# Patient Record
Sex: Female | Born: 1970 | Race: White | Hispanic: No | Marital: Married | Smoking: Never smoker
Health system: Southern US, Community
[De-identification: ages and names within clinical notes are randomized; demographics above are authoritative.]

## PROBLEM LIST (undated history)

## (undated) DIAGNOSIS — Z8249 Family history of ischemic heart disease and other diseases of the circulatory system: Secondary | ICD-10-CM

## (undated) DIAGNOSIS — G43829 Menstrual migraine, not intractable, without status migrainosus: Secondary | ICD-10-CM

## (undated) DIAGNOSIS — K219 Gastro-esophageal reflux disease without esophagitis: Secondary | ICD-10-CM

## (undated) DIAGNOSIS — R638 Other symptoms and signs concerning food and fluid intake: Secondary | ICD-10-CM

## (undated) DIAGNOSIS — I10 Essential (primary) hypertension: Secondary | ICD-10-CM

## (undated) DIAGNOSIS — R0609 Other forms of dyspnea: Secondary | ICD-10-CM

## (undated) DIAGNOSIS — T7840XA Allergy, unspecified, initial encounter: Secondary | ICD-10-CM

## (undated) DIAGNOSIS — M1990 Unspecified osteoarthritis, unspecified site: Secondary | ICD-10-CM

## (undated) DIAGNOSIS — A6000 Herpesviral infection of urogenital system, unspecified: Secondary | ICD-10-CM

## (undated) DIAGNOSIS — O021 Missed abortion: Secondary | ICD-10-CM

## (undated) DIAGNOSIS — N879 Dysplasia of cervix uteri, unspecified: Secondary | ICD-10-CM

## (undated) DIAGNOSIS — F419 Anxiety disorder, unspecified: Secondary | ICD-10-CM

## (undated) HISTORY — DX: Family history of ischemic heart disease and other diseases of the circulatory system: Z82.49

## (undated) HISTORY — DX: Unspecified osteoarthritis, unspecified site: M19.90

## (undated) HISTORY — DX: Other forms of dyspnea: R06.09

## (undated) HISTORY — PX: MOUTH SURGERY: SHX715

## (undated) HISTORY — DX: Gastro-esophageal reflux disease without esophagitis: K21.9

## (undated) HISTORY — DX: Essential (primary) hypertension: I10

## (undated) HISTORY — DX: Anxiety disorder, unspecified: F41.9

## (undated) HISTORY — DX: Allergy, unspecified, initial encounter: T78.40XA

## (undated) HISTORY — DX: Other symptoms and signs concerning food and fluid intake: R63.8

## (undated) HISTORY — PX: DILATION AND CURETTAGE OF UTERUS: SHX78

## (undated) HISTORY — DX: Menstrual migraine, not intractable, without status migrainosus: G43.829

## (undated) HISTORY — DX: Herpesviral infection of urogenital system, unspecified: A60.00

## (undated) HISTORY — DX: Dysplasia of cervix uteri, unspecified: N87.9

---

## 2006-07-06 ENCOUNTER — Ambulatory Visit: Payer: Self-pay | Admitting: Obstetrics and Gynecology

## 2009-11-20 HISTORY — PX: TONSILECTOMY/ADENOIDECTOMY WITH MYRINGOTOMY: SHX6125

## 2010-02-24 ENCOUNTER — Ambulatory Visit: Payer: Self-pay | Admitting: Otolaryngology

## 2011-01-31 ENCOUNTER — Ambulatory Visit: Payer: Self-pay | Admitting: Internal Medicine

## 2011-12-12 ENCOUNTER — Ambulatory Visit: Payer: Self-pay | Admitting: Obstetrics and Gynecology

## 2011-12-12 LAB — CBC
HCT: 41.1 % (ref 35.0–47.0)
HGB: 13.7 g/dL (ref 12.0–16.0)
MCH: 30.6 pg (ref 26.0–34.0)
MCHC: 33.4 g/dL (ref 32.0–36.0)
RBC: 4.49 10*6/uL (ref 3.80–5.20)
WBC: 8.7 10*3/uL (ref 3.6–11.0)

## 2011-12-14 LAB — PATHOLOGY REPORT

## 2012-06-16 ENCOUNTER — Emergency Department: Payer: Self-pay | Admitting: Unknown Physician Specialty

## 2012-06-16 LAB — CBC
HCT: 40.6 % (ref 35.0–47.0)
MCH: 31.5 pg (ref 26.0–34.0)
MCHC: 34.8 g/dL (ref 32.0–36.0)
MCV: 90 fL (ref 80–100)
Platelet: 218 10*3/uL (ref 150–440)
RBC: 4.49 10*6/uL (ref 3.80–5.20)
RDW: 12.7 % (ref 11.5–14.5)

## 2012-06-16 LAB — WET PREP, GENITAL

## 2012-06-16 LAB — URINALYSIS, COMPLETE
Glucose,UR: NEGATIVE mg/dL (ref 0–75)
Leukocyte Esterase: NEGATIVE
Nitrite: NEGATIVE
Ph: 7 (ref 4.5–8.0)
RBC,UR: NONE SEEN /HPF (ref 0–5)
Specific Gravity: 1.002 (ref 1.003–1.030)
Squamous Epithelial: 1
WBC UR: <1 /HPF (ref 0–5)

## 2012-06-16 LAB — HCG, QUANTITATIVE, PREGNANCY: Beta Hcg, Quant.: 7298 m[IU]/mL — ABNORMAL HIGH

## 2012-06-17 ENCOUNTER — Ambulatory Visit: Payer: Self-pay | Admitting: Obstetrics and Gynecology

## 2013-07-10 LAB — HM PAP SMEAR: HM Pap smear: NEGATIVE

## 2013-08-05 ENCOUNTER — Ambulatory Visit: Payer: Self-pay | Admitting: Obstetrics and Gynecology

## 2014-02-19 ENCOUNTER — Encounter: Payer: Self-pay | Admitting: Internal Medicine

## 2014-02-19 ENCOUNTER — Ambulatory Visit (INDEPENDENT_AMBULATORY_CARE_PROVIDER_SITE_OTHER): Payer: BC Managed Care – PPO | Admitting: Internal Medicine

## 2014-02-19 ENCOUNTER — Encounter (INDEPENDENT_AMBULATORY_CARE_PROVIDER_SITE_OTHER): Payer: Self-pay

## 2014-02-19 VITALS — BP 140/100 | HR 86 | Temp 98.6°F | Resp 16 | Ht 67.0 in | Wt 256.5 lb

## 2014-02-19 DIAGNOSIS — R5383 Other fatigue: Secondary | ICD-10-CM

## 2014-02-19 DIAGNOSIS — M129 Arthropathy, unspecified: Secondary | ICD-10-CM

## 2014-02-19 DIAGNOSIS — R635 Abnormal weight gain: Secondary | ICD-10-CM

## 2014-02-19 DIAGNOSIS — E669 Obesity, unspecified: Secondary | ICD-10-CM

## 2014-02-19 DIAGNOSIS — M130 Polyarthritis, unspecified: Secondary | ICD-10-CM

## 2014-02-19 DIAGNOSIS — M1990 Unspecified osteoarthritis, unspecified site: Secondary | ICD-10-CM

## 2014-02-19 DIAGNOSIS — R5381 Other malaise: Secondary | ICD-10-CM

## 2014-02-19 LAB — CBC WITH DIFFERENTIAL/PLATELET
BASOS ABS: 0 10*3/uL (ref 0.0–0.1)
Basophils Relative: 0.6 % (ref 0.0–3.0)
Eosinophils Absolute: 0 10*3/uL (ref 0.0–0.7)
Eosinophils Relative: 0.6 % (ref 0.0–5.0)
HEMATOCRIT: 42.8 % (ref 36.0–46.0)
Hemoglobin: 14.3 g/dL (ref 12.0–15.0)
Lymphocytes Relative: 38.7 % (ref 12.0–46.0)
Lymphs Abs: 2.1 10*3/uL (ref 0.7–4.0)
MCHC: 33.3 g/dL (ref 30.0–36.0)
MCV: 91.4 fl (ref 78.0–100.0)
MONOS PCT: 6.3 % (ref 3.0–12.0)
Monocytes Absolute: 0.3 10*3/uL (ref 0.1–1.0)
Neutro Abs: 3 10*3/uL (ref 1.4–7.7)
Neutrophils Relative %: 53.8 % (ref 43.0–77.0)
PLATELETS: 225 10*3/uL (ref 150.0–400.0)
RBC: 4.69 Mil/uL (ref 3.87–5.11)
RDW: 12.8 % (ref 11.5–14.6)
WBC: 5.5 10*3/uL (ref 4.5–10.5)

## 2014-02-19 LAB — TSH: TSH: 0.3 u[IU]/mL — AB (ref 0.35–5.50)

## 2014-02-19 LAB — COMPREHENSIVE METABOLIC PANEL
ALBUMIN: 3.7 g/dL (ref 3.5–5.2)
ALT: 29 U/L (ref 0–35)
AST: 22 U/L (ref 0–37)
Alkaline Phosphatase: 48 U/L (ref 39–117)
BUN: 11 mg/dL (ref 6–23)
CALCIUM: 8.6 mg/dL (ref 8.4–10.5)
CHLORIDE: 103 meq/L (ref 96–112)
CO2: 30 meq/L (ref 19–32)
Creatinine, Ser: 1 mg/dL (ref 0.4–1.2)
GFR: 62.12 mL/min (ref >60.00)
Glucose, Bld: 90 mg/dL (ref 70–99)
Potassium: 4.1 mEq/L (ref 3.5–5.1)
Sodium: 137 mEq/L (ref 135–145)
Total Bilirubin: 0.7 mg/dL (ref 0.3–1.2)
Total Protein: 6.8 g/dL (ref 6.0–8.3)

## 2014-02-19 LAB — SEDIMENTATION RATE: Sed Rate: 13 mm/hr (ref 0–22)

## 2014-02-19 LAB — C-REACTIVE PROTEIN: CRP: 0.6 mg/dL (ref 0.5–20.0)

## 2014-02-19 MED ORDER — HYDROCHLOROTHIAZIDE 25 MG PO TABS: 25.0000 mg | ORAL_TABLET | Freq: Every day | ORAL | Status: DC

## 2014-02-19 NOTE — Progress Notes (Signed)
Patient ID: Laura [name], female   DOB: 06-04-1971, 43 y.o.   MRN: [id number]  Patient Active Problem List   Diagnosis Date Noted  . Polyarthritis of multiple sites 02/21/2014  . Obesity, unspecified 02/21/2014    Subjective:  CC:   Chief Complaint  Patient presents with  . Establish Care    HPI:   Laura [name] a 43 y.o. female who presents  With joint pain    Cc:  Polyarthritis.,  keepin her awake at night.  Right knee righ hip mostly  Relieved with 4 advil at bedtime but doesn't always help, gets stiff after an hour ride to work. . Knees hurt most with downward descent ,  No giving way.  No history of trauma ,  No MVAs since high school.  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  Does not exercise regularly but has started walking trails. occl ETOH 1 glass occasionally .  No tobacco husband is a Higher education careers adviser.  Obesity:  She ha had a  Wt gain of 20 lb s since October. Diet reviewed, no sweet tea or soft drinks,  Drinks Herbal Life shake for breakfast.at 5:30 am.  9:00 almonds 12:00   grilled chicken wrap   With a fruit cup.  Mid day snack,  almonds a handful,  String cheese.  Dinner  Wore a size 12 to 14 in high school.     Past Medical History  Diagnosis Date  . Allergy   . Hypertension     HX of high readings     No Known Allergies  Past Surgical History  Procedure Laterality Date  . Tonsilectomy/adenoidectomy with myringotomy Bilateral 2011  . Dilation and curettage of uterus  2014 FEB and August    History   Social History  . Marital Status: Married    Spouse Name: N/A    Number of Children: N/A  . Years of Education: N/A   Occupational History  . Not on file.   Social History Main Topics  . Smoking status: Never Smoker   . Smokeless tobacco: Never Used  . Alcohol Use: Yes     Comment:  occasioally  . Drug Use: No  . Sexual Activity: Yes   Other Topics Concern  . Not on file   Social History Narrative  . No narrative on file    Family History  Problem Relation Age of Onset  . Arthritis Mother   . Hypertension Mother   . Diabetes Mother   . Heart disease Father   . Hyperlipidemia Father   . Hypertension Father    Review of Systems:   The rest of the review of systems was negative except those addressed in the HPI.   Objective:  BP 140/100  Pulse 86  Temp(Src) 98.6 F (37 C) (Oral)  Resp 16  Ht 5' 7"  (1.702 m)  Wt 256 lb 8 oz (116.348 kg)  BMI 40.16 kg/m2  SpO2 98%  General appearance: alert, cooperative and appears stated age Ears: normal TM's and external ear canals both ears Throat: lips, mucosa, and tongue normal; teeth and gums normal Neck: no adenopathy, no carotid bruit, supple, symmetrical, trachea midline and thyroid not enlarged, symmetric, no tenderness/mass/nodules Back: symmetric, no curvature. ROM normal. No CVA tenderness. Lungs: clear  to auscultation bilaterally Heart: regular rate and rhythm, S1, S2 normal, no murmur, click, rub or gallop Abdomen: soft, non-tender; bowel sounds normal; no masses,  no organomegaly Pulses: 2+ and symmetric Skin: Skin color, texture, turgor normal. No rashes or lesions Lymph nodes: Cervical, supraclavicular, and axillary nodes normal.  Assessment and Plan:  Polyarthritis of multiple sites She has no synovitis on exam and the joints affected are mostly weight bearing. Will screen for inflammatory arthritis with ESR and CRP  Obesity, unspecified I have addressed  BMI and recommended a low glycemic index diet utilizing smaller more frequent meals to increase metabolism.  I have also recommended that patient start exercising with a goal of 30 minutes of aerobic exercise a minimum of 5 days per week. Screening for lipid disorders, thyroid and diabetes to be done today.   A total of 44 minutes was  spent with patient more than half of which was spent in counseling.  Updated Medication List Outpatient Encounter Prescriptions as of 02/19/2014  Medication Sig  . hydrochlorothiazide (HYDRODIURIL) 25 MG tablet Take 1 tablet (25 mg total) by mouth daily.     Orders Placed This Encounter  Procedures  . TSH  . Comprehensive metabolic panel  . CBC with Differential  . Sedimentation rate  . C-reactive protein    No Follow-up on file.

## 2014-02-19 NOTE — Progress Notes (Signed)
Pre-visit discussion using our clinic review tool. No additional management support is needed unless otherwise documented below in the visit note.  

## 2014-02-19 NOTE — Patient Instructions (Addendum)
We are starting hctz 25 mg daily for blood pressure Our Goal is to get your bp down to  130 /80 before we consider adding  Phentermine for wt loss   Returnin one month for BP check and fasting labs   Goal with exercise is 30 minutes of aerobic exercise 5 times weekly  This is  One version of a  "Low GI"  Diet:  It will still lower your blood sugars and allow you to lose 4 to 8  lbs  per month if you follow it carefully.  Your goal with exercise is a minimum of 30 minutes of aerobic exercise 5 days per week (Walking does not count once it becomes easy!)    All of the foods can be found at grocery stores and in bulk at [employer].  The Atkins protein bars and shakes are available in more varieties at [employer], [employer] and [place].     7 AM Breakfast:  Choose from the following:  Low carbohydrate Protein  Shakes (I recommend the EAS AdvantEdge "Carb Control" shakes  Or the low carb shakes by Atkins.    2.5 carbs   Arnold's "Sandwhich Thin"toasted  w/ peanut butter (no jelly: about 20 net carbs  "Bagel Thin" with cream cheese and salmon: about 20 carbs   a scrambled egg/bacon/cheese burrito made with Mission's "carb balance" whole wheat tortilla  (about 10 net carbs )   Avoid cereal and bananas, oatmeal and cream of wheat and grits. They are loaded with carbohydrates!   10 AM: high protein snack  Protein bar by Atkins (the snack size, under 200 cal, usually < 6 net carbs).    A stick of cheese:  Around 1 carb,  100 cal     Dannon Light n Fit [place] Yogurt  (80 cal, 8 carbs)  Other so called "protein bars" and Greek yogurts tend to be loaded with carbohydrates.  Remember, in food advertising, the word "energy" is synonymous for " carbohydrate."  Lunch:   A Sandwich using the bread choices listed, Can use any  Eggs,  lunchmeat, grilled meat or canned tuna), avocado, regular mayo/mustard  and cheese.  A Salad using blue cheese, ranch,  Goddess or vinagrette,  No croutons or "confetti" and no  "candied nuts" but regular nuts OK.   No pretzels or chips.  Pickles and miniature sweet peppers are a good low carb alternative that provide a "crunch"  The bread is the only source of carbohydrate in a sandwich and  can be decreased by trying some of these alternatives to traditional loaf bread  Joseph's makes a pita bread and a flat bread that are 50 cal and 4 net carbs available at [place] and [place].  This can be toasted to use with hummous as well  Toufayan makes a low carb flatbread that's 100 cal and 9 net carbs available at [employer] and [employer]J's makes 2 sizes of  Low carb whole wheat tortilla  (The large one is 210 cal and 6 net carbs) Avoid "Low fat dressings, as well as Barry Brunner and [place] dressings They are loaded with sugar!   3 PM/ Mid day  Snack:  Consider  1 ounce of  almonds, walnuts, pistachios, pecans, peanuts,  Macadamia nuts or a nut medley.  Avoid "granola"; the dried cranberries and raisins are loaded with carbohydrates. Mixed nuts as long as there are no raisins,  cranberries or dried fruit.     6 PM  Dinner:  Meat/fowl/fish with a green salad, and either broccoli, cauliflower, green beans, spinach, brussel sprouts or  Lima beans. DO NOT BREAD THE PROTEIN!!      There is a low carb pasta by Dreamfield's that is acceptable and tastes great: only 5 digestible carbs/serving.( All grocery stores but BJs carry it )  Try Hurley Cisco Angelo's chicken piccata or chicken or eggplant parm over low carb pasta.(Lowes and BJs)   Marjory Lies Sanchez's "Carnitas" (pulled pork, no sauce,  0 carbs) or his beef pot roast to make a dinner burrito (at [employer]J's)  Pesto over low carb pasta (bj's sells a good quality pesto in the center refrigerated section of the deli   Whole wheat pasta is still full of digestible carbs and  Not as low in glycemic index as Dreamfield's.   Brown rice is still rice,  So skip the rice and noodles if you eat [place] or [place] (or at least limit to 1/2 cup)  9  PM snack :   Breyer's "low carb" fudgsicle or  ice cream bar (Carb Smart line), or  Weight Watcher's ice cream bar , or another "no sugar added" ice cream;  a serving of fresh berries/cherries with whipped cream   Cheese or DANNON'S LlGHT N FIT [place] YOGURT  Avoid bananas, pineapple, grapes  and watermelon on a regular basis because they are high in sugar.  THINK OF THEM AS DESSERT  Remember that snack Substitutions should be less than 10 NET carbs per serving and meals < 20 carbs. Remember to subtract fiber grams to get the "net carbs."

## 2014-02-21 ENCOUNTER — Encounter: Payer: Self-pay | Admitting: Internal Medicine

## 2014-02-21 DIAGNOSIS — E669 Obesity, unspecified: Secondary | ICD-10-CM | POA: Insufficient documentation

## 2014-02-21 DIAGNOSIS — M130 Polyarthritis, unspecified: Secondary | ICD-10-CM | POA: Insufficient documentation

## 2014-02-21 NOTE — Assessment & Plan Note (Signed)
She has no synovitis on exam and the joints affected are mostly weight bearing. Will screen for inflammatory arthritis with ESR and CRP

## 2014-02-21 NOTE — Assessment & Plan Note (Signed)
I have addressed  BMI and recommended a low glycemic index diet utilizing smaller more frequent meals to increase metabolism.  I have also recommended that patient start exercising with a goal of 30 minutes of aerobic exercise a minimum of 5 days per week. Screening for lipid disorders, thyroid and diabetes to be done today.   

## 2014-03-16 ENCOUNTER — Telehealth: Payer: Self-pay | Admitting: *Deleted

## 2014-03-16 DIAGNOSIS — R5381 Other malaise: Secondary | ICD-10-CM

## 2014-03-16 DIAGNOSIS — Z79899 Other long term (current) drug therapy: Secondary | ICD-10-CM

## 2014-03-16 DIAGNOSIS — E559 Vitamin D deficiency, unspecified: Secondary | ICD-10-CM

## 2014-03-16 DIAGNOSIS — R5383 Other fatigue: Secondary | ICD-10-CM

## 2014-03-16 DIAGNOSIS — E669 Obesity, unspecified: Secondary | ICD-10-CM

## 2014-03-16 NOTE — Telephone Encounter (Signed)
Pt is coming in tomorrow what labs and dx?  

## 2014-03-16 NOTE — Telephone Encounter (Signed)
Some problem with epic getting CMET and Vit D ordered as futres for Tuesday. Please add

## 2014-03-17 ENCOUNTER — Other Ambulatory Visit (INDEPENDENT_AMBULATORY_CARE_PROVIDER_SITE_OTHER): Payer: BC Managed Care – PPO

## 2014-03-17 ENCOUNTER — Telehealth: Payer: Self-pay | Admitting: *Deleted

## 2014-03-17 ENCOUNTER — Ambulatory Visit (INDEPENDENT_AMBULATORY_CARE_PROVIDER_SITE_OTHER): Payer: BC Managed Care – PPO | Admitting: *Deleted

## 2014-03-17 VITALS — BP 132/85 | HR 71

## 2014-03-17 DIAGNOSIS — R5381 Other malaise: Secondary | ICD-10-CM

## 2014-03-17 DIAGNOSIS — R030 Elevated blood-pressure reading, without diagnosis of hypertension: Secondary | ICD-10-CM

## 2014-03-17 DIAGNOSIS — R5383 Other fatigue: Principal | ICD-10-CM

## 2014-03-17 DIAGNOSIS — E669 Obesity, unspecified: Secondary | ICD-10-CM

## 2014-03-17 LAB — CBC WITH DIFFERENTIAL/PLATELET
Basophils Absolute: 0 10*3/uL (ref 0.0–0.1)
Basophils Relative: 0.6 % (ref 0.0–3.0)
EOS PCT: 0.8 % (ref 0.0–5.0)
Eosinophils Absolute: 0.1 10*3/uL (ref 0.0–0.7)
HCT: 44.1 % (ref 36.0–46.0)
Hemoglobin: 14.9 g/dL (ref 12.0–15.0)
Lymphocytes Relative: 34.9 % (ref 12.0–46.0)
Lymphs Abs: 2.6 10*3/uL (ref 0.7–4.0)
MCHC: 33.8 g/dL (ref 30.0–36.0)
MCV: 91.7 fl (ref 78.0–100.0)
MONO ABS: 0.5 10*3/uL (ref 0.1–1.0)
Monocytes Relative: 6.3 % (ref 3.0–12.0)
NEUTROS PCT: 57.4 % (ref 43.0–77.0)
Neutro Abs: 4.2 10*3/uL (ref 1.4–7.7)
PLATELETS: 249 10*3/uL (ref 150.0–400.0)
RBC: 4.81 Mil/uL (ref 3.87–5.11)
RDW: 12.8 % (ref 11.5–14.6)
WBC: 7.3 10*3/uL (ref 4.5–10.5)

## 2014-03-17 LAB — COMPREHENSIVE METABOLIC PANEL
ALT: 29 U/L (ref 0–35)
AST: 22 U/L (ref 0–37)
Albumin: 3.7 g/dL (ref 3.5–5.2)
Alkaline Phosphatase: 47 U/L (ref 39–117)
BUN: 13 mg/dL (ref 6–23)
CALCIUM: 9.3 mg/dL (ref 8.4–10.5)
CHLORIDE: 100 meq/L (ref 96–112)
CO2: 26 mEq/L (ref 19–32)
Creatinine, Ser: 0.8 mg/dL (ref 0.4–1.2)
GFR: 81.94 mL/min (ref >60.00)
Glucose, Bld: 89 mg/dL (ref 70–99)
Potassium: 3.6 mEq/L (ref 3.5–5.1)
SODIUM: 139 meq/L (ref 135–145)
Total Bilirubin: 0.6 mg/dL (ref 0.3–1.2)
Total Protein: 6.7 g/dL (ref 6.0–8.3)

## 2014-03-17 LAB — LIPID PANEL
CHOLESTEROL: 164 mg/dL (ref 0–200)
HDL: 55.8 mg/dL (ref >39.00)
LDL Cholesterol: 90 mg/dL (ref 0–99)
Total CHOL/HDL Ratio: 3
Triglycerides: 89 mg/dL (ref 0.0–149.0)
VLDL: 17.8 mg/dL (ref 0.0–40.0)

## 2014-03-17 LAB — TSH: TSH: 1.63 u[IU]/mL (ref 0.35–5.50)

## 2014-03-17 MED ORDER — PHENTERMINE HCL 37.5 MG PO TABS: 19.0000 mg | ORAL_TABLET | Freq: Two times a day (BID) | ORAL | Status: DC

## 2014-03-17 NOTE — Progress Notes (Signed)
   Subjective:    Patient ID: Laura [name], female    DOB: Apr 10, 1971, 43 y.o.   MRN: [id number]  HPI    Review of Systems     Objective:   Physical Exam        Assessment & Plan:  Patient came in today to her blood pressure checked. She was started on HCTZ at her last visit for hypertension.

## 2014-03-17 NOTE — Telephone Encounter (Signed)
Patient came in today for BP check, it was 132/85. She would like to know if you are still considering giving her some Phentermine to help with weight loss. Please would like response via Mychart. She did have her labs done today as well. I have documented her BP and forwarded you that encounter.

## 2014-03-17 NOTE — Addendum Note (Signed)
Addended by: Crecencio Mc on: 03/17/2014 05:07 PM   Modules accepted: Orders

## 2014-03-17 NOTE — Telephone Encounter (Signed)
Yes we can start phentermine.  She will need to have her blood pressure checked again after she has been taking the medication for a week ,  The rx must be picked up

## 2014-03-18 LAB — VITAMIN D 25 HYDROXY (VIT D DEFICIENCY, FRACTURES): Vit D, 25-Hydroxy: 34 ng/mL (ref 30–89)

## 2014-03-18 NOTE — Telephone Encounter (Signed)
Patient notified that she may start phentermine and that requires BP check  After one week patient will pickup. Script

## 2014-03-21 ENCOUNTER — Encounter: Payer: Self-pay | Admitting: Internal Medicine

## 2014-04-15 ENCOUNTER — Telehealth: Payer: Self-pay | Admitting: *Deleted

## 2014-04-15 ENCOUNTER — Ambulatory Visit (INDEPENDENT_AMBULATORY_CARE_PROVIDER_SITE_OTHER): Payer: BC Managed Care – PPO | Admitting: *Deleted

## 2014-04-15 ENCOUNTER — Ambulatory Visit: Payer: BC Managed Care – PPO

## 2014-04-15 VITALS — BP 132/88 | HR 103 | Wt 240.0 lb

## 2014-04-15 DIAGNOSIS — E669 Obesity, unspecified: Secondary | ICD-10-CM

## 2014-04-15 NOTE — Telephone Encounter (Signed)
The max dose is 1/2 tablet twice daily.  Please call pharmacy and rectify.

## 2014-04-15 NOTE — Telephone Encounter (Signed)
Pt presents for BP check. Pt taking HCTZ 25 mg one daily. Pt has been taking Phentermine 37.5 mg one tablet bid. Epic directions show 1/2 tab bid. Pt's Rx bottle shows one tablet BID. BP 132/88, HR ranged 99-105. Pt has lost 16.5lbs. No other complaints voiced by pt. Pt states ok to send response via mychart. Pt will run out of Rx soon due to taking one tablet bid, please clarify how you would like her to be taking the Phentermine

## 2014-04-15 NOTE — Progress Notes (Signed)
Pt presents for BP check. Pt taking HCTZ 25 mg one daily. Pt has been taking Phentermine 37.5 mg one tablet bid. Epic directions show 1/2 tab bid. Pt's Rx bottle shows one tablet BID. BP 132/88, HR ranged 99-105. Pt has lost 16.5lbs. No other complaints voiced by pt. Dr. Derrel Nip notified.

## 2014-04-15 NOTE — Telephone Encounter (Signed)
Spoke with Wells Guiles at [facility], notified of error on patient's bottle, advised directions need to read 1/2 tab bid. She states it was entered wrong and will fix directions to read correctly. Sent pt mychart message that she needs to be taking 1/2 tablet bid.

## 2014-05-25 ENCOUNTER — Other Ambulatory Visit: Payer: Self-pay | Admitting: Internal Medicine

## 2014-05-25 DIAGNOSIS — E669 Obesity, unspecified: Secondary | ICD-10-CM

## 2014-05-25 NOTE — Telephone Encounter (Signed)
Refill one 30 days only.  Has not been seen for 3 month follow up so needs office visit prior to any additional refills

## 2014-05-25 NOTE — Telephone Encounter (Signed)
Last refill 6.3.15, last OV 4.2.15, no future OV.  Please advise refill.

## 2014-05-25 NOTE — Telephone Encounter (Signed)
Script faxed.

## 2014-05-28 ENCOUNTER — Other Ambulatory Visit: Payer: Self-pay | Admitting: *Deleted

## 2014-05-28 MED ORDER — PHENTERMINE HCL 37.5 MG PO TABS: ORAL_TABLET | ORAL | Status: DC

## 2014-06-11 ENCOUNTER — Ambulatory Visit: Payer: BC Managed Care – PPO | Admitting: Adult Health

## 2014-07-01 LAB — HM PAP SMEAR: HM Pap smear: NORMAL

## 2014-07-28 ENCOUNTER — Other Ambulatory Visit: Payer: Self-pay | Admitting: Internal Medicine

## 2014-07-31 ENCOUNTER — Telehealth: Payer: Self-pay | Admitting: Internal Medicine

## 2014-07-31 ENCOUNTER — Other Ambulatory Visit: Payer: Self-pay | Admitting: Internal Medicine

## 2014-07-31 MED ORDER — PHENTERMINE HCL 37.5 MG PO TABS: ORAL_TABLET | ORAL | Status: DC

## 2014-07-31 NOTE — Telephone Encounter (Signed)
Rx faxed to pharmacy. Also called pharmacy to confirm correct directions and quantity. Pt notified.

## 2014-07-31 NOTE — Telephone Encounter (Signed)
Pt called in and made an appt for medication follow up for 10/21 @ 10:30 and said she will be running out of phentermine 37.5mg  before her appt and was needing a refill before then.

## 2014-07-31 NOTE — Telephone Encounter (Signed)
Last office visit was April, refill or appt first? Pt was notified that she needed appt and would get refill then. Scheduled for 09/09/14 but still requesting refill now

## 2014-07-31 NOTE — Telephone Encounter (Signed)
REFILL FOR 1/2 TABLET BID SENT IN .  Please ask pharmacy if the refills since May (when the error was found) have been for 300 ro for 60 since she was taking too high a dose initially)

## 2014-08-01 LAB — HM MAMMOGRAPHY: HM Mammogram: NORMAL

## 2014-08-06 ENCOUNTER — Telehealth: Payer: Self-pay | Admitting: *Deleted

## 2014-08-06 NOTE — Telephone Encounter (Signed)
Fax from [facility], Phentermine needing PA. Pa completed online, [facility] has approved it. Pharmacy notified.

## 2014-08-31 ENCOUNTER — Ambulatory Visit: Payer: Self-pay | Admitting: Obstetrics and Gynecology

## 2014-09-09 ENCOUNTER — Ambulatory Visit (INDEPENDENT_AMBULATORY_CARE_PROVIDER_SITE_OTHER): Payer: BC Managed Care – PPO | Admitting: Internal Medicine

## 2014-09-09 VITALS — BP 112/78 | HR 95 | Temp 98.3°F | Resp 16 | Ht 67.0 in | Wt 218.5 lb

## 2014-09-09 DIAGNOSIS — E669 Obesity, unspecified: Secondary | ICD-10-CM

## 2014-09-09 DIAGNOSIS — Z79899 Other long term (current) drug therapy: Secondary | ICD-10-CM | POA: Insufficient documentation

## 2014-09-09 LAB — COMPREHENSIVE METABOLIC PANEL
ALT: 32 U/L (ref 0–35)
AST: 21 U/L (ref 0–37)
Albumin: 3.3 g/dL — ABNORMAL LOW (ref 3.5–5.2)
Alkaline Phosphatase: 53 U/L (ref 39–117)
BUN: 10 mg/dL (ref 6–23)
CO2: 32 mEq/L (ref 19–32)
Calcium: 9 mg/dL (ref 8.4–10.5)
Chloride: 100 mEq/L (ref 96–112)
Creatinine, Ser: 1 mg/dL (ref 0.4–1.2)
GFR: 61.96 mL/min (ref >60.00)
Glucose, Bld: 78 mg/dL (ref 70–99)
Potassium: 3.5 mEq/L (ref 3.5–5.1)
Sodium: 140 mEq/L (ref 135–145)
Total Bilirubin: 1.2 mg/dL (ref 0.2–1.2)
Total Protein: 6.8 g/dL (ref 6.0–8.3)

## 2014-09-09 MED ORDER — PHENTERMINE HCL 37.5 MG PO TABS: 37.5000 mg | ORAL_TABLET | Freq: Every day | ORAL | Status: DC

## 2014-09-09 NOTE — Progress Notes (Signed)
Pre-visit discussion using our clinic review tool. No additional management support is needed unless otherwise documented below in the visit note.  

## 2014-09-09 NOTE — Progress Notes (Signed)
Patient ID: Laura [name], female   DOB: 1971-09-30, 43 y.o.   MRN: [id number]   Patient Active Problem List   Diagnosis Date Noted  . Long-term use of high-risk medication 09/09/2014  . Polyarthritis of multiple sites 02/21/2014  . Obesity 02/21/2014    Subjective:  CC:   Chief Complaint  Patient presents with  . Follow-up    medication / weight    HPI:   Laura [name] is a 43 y.o. female who presents for Follow up on obesity, treated with phentermine.  Due to a pharmacist error she was initially taking double the standard dose  And was having trouble sleeping.  The error was discovered and she has been tolerating the standard dose will.  She is taking 1/2 tablet twice daily and has lost 22 lbs in the last 3 months    Diet reviewed.  She is following a low GI diet.   Exercise:  She is exercising 3 to 5 times per week,  Using  A local gym [employer]) including the treadmill  And weight training, minimum of 3 .  Scheduled has been impacted by a constantly changing work schedule.  She has been working 12 hour shifts.  [employer] for the [employer].    Body mass index is 34.21 kg/(m^2).    Past Medical History  Diagnosis Date  . Allergy   . Hypertension     HX of high readings     Past Surgical History  Procedure Laterality Date  . Tonsilectomy/adenoidectomy with myringotomy Bilateral 2011  . Dilation and curettage of uterus  2014 FEB and August       The following portions of the patient's history were reviewed and updated as appropriate: Allergies, current medications, and problem list.    Review of Systems:   Patient denies headache, fevers, malaise, unintentional weight loss, skin rash, eye pain, sinus congestion and sinus pain, sore throat, dysphagia,  hemoptysis , cough, dyspnea, wheezing, chest pain, palpitations, orthopnea, edema, abdominal pain, nausea, melena, diarrhea, constipation, flank pain, dysuria, hematuria, urinary  Frequency,  nocturia, numbness, tingling, seizures,  Focal weakness, Loss of consciousness,  Tremor, insomnia, depression, anxiety, and suicidal ideation.     History   Social History  . Marital Status: Married    Spouse Name: N/A    Number of Children: N/A  . Years of Education: N/A   Occupational History  . Not on file.   Social History Main Topics  . Smoking status: Never Smoker   . Smokeless tobacco: Never Used  . Alcohol Use: Yes     Comment: occasioally  . Drug Use: No  . Sexual Activity: Yes   Other Topics Concern  . Not on file   Social History Narrative  . No narrative on file    Objective:  Filed Vitals:   09/09/14 1111  BP: 112/78  Pulse: 95  Temp: 98.3 F (36.8 C)  Resp: 16     General appearance: alert, cooperative and appears stated age Ears: normal TM's and external ear canals both ears Throat: lips, mucosa, and tongue normal; teeth and gums normal Neck: no adenopathy, no carotid bruit, supple, symmetrical, trachea midline and thyroid not enlarged, symmetric, no tenderness/mass/nodules Back: symmetric, no curvature. ROM normal. No CVA tenderness. Lungs: clear to auscultation bilaterally Heart: regular rate and rhythm, S1, S2 normal, no murmur, click, rub or gallop Abdomen: soft, non-tender; bowel sounds normal; no masses,  no organomegaly Pulses: 2+ and symmetric Skin: Skin color, texture,  turgor normal. No rashes or lesions Lymph nodes: Cervical, supraclavicular, and axillary nodes normal.  Assessment and Plan:  Obesity I have congratulated her in reduction of   BMI and encouraged  Continued weight loss with goal of 5% of body weigh over the next 3 months using a low glycemic index diet and regular exercise a minimum of 5 days per week. Vital signs are normal and she is tolerated  Phentermine , so it has been refilled.  Liver enzymes and renal function and lytes are all normal.  Lab Results  Component Value Date   ALT 32 09/09/2014   AST 21 09/09/2014    ALKPHOS 53 09/09/2014   BILITOT 1.2 09/09/2014   Lab Results  Component Value Date   NA 140 09/09/2014   K 3.5 09/09/2014   CL 100 09/09/2014   CO2 32 09/09/2014   Lab Results  Component Value Date   CREATININE 1.0 09/09/2014       Updated Medication List Outpatient Encounter Prescriptions as of 09/09/2014  Medication Sig  . hydrochlorothiazide (HYDRODIURIL) 25 MG tablet Take 1 tablet (25 mg total) by mouth daily.  . phentermine (ADIPEX-P) 37.5 MG tablet Take 1 tablet (37.5 mg total) by mouth daily before breakfast. TAKE 1/2  TABLET BY MOUTH TWO TIMES DAILY  . [DISCONTINUED] phentermine (ADIPEX-P) 37.5 MG tablet TAKE 1/2  TABLET BY MOUTH TWO TIMES DAILY     Orders Placed This Encounter  Procedures  . Comprehensive metabolic panel    Return in about 3 months (around 12/10/2014).

## 2014-09-09 NOTE — Patient Instructions (Signed)
You are doing great!!!!  We will continue the medication for another 3 months.   Goal wt loss 11  Lbs minimum (5%)   Try to get a minimum of 4 days of exercise in weekly and continue your current diet

## 2014-09-10 ENCOUNTER — Encounter: Payer: Self-pay | Admitting: Internal Medicine

## 2014-09-10 ENCOUNTER — Ambulatory Visit: Payer: Self-pay | Admitting: Obstetrics and Gynecology

## 2014-09-10 LAB — HM MAMMOGRAPHY

## 2014-09-12 NOTE — Assessment & Plan Note (Addendum)
I have congratulated her in reduction of   BMI and encouraged  Continued weight loss with goal of 5% of body weigh over the next 3 months using a low glycemic index diet and regular exercise a minimum of 5 days per week. Vital signs are normal and she is tolerated  Phentermine , so it has been refilled.  Liver enzymes and renal function and lytes are all normal.  Lab Results  Component Value Date   ALT 32 09/09/2014   AST 21 09/09/2014   ALKPHOS 53 09/09/2014   BILITOT 1.2 09/09/2014   Lab Results  Component Value Date   NA 140 09/09/2014   K 3.5 09/09/2014   CL 100 09/09/2014   CO2 32 09/09/2014   Lab Results  Component Value Date   CREATININE 1.0 09/09/2014

## 2014-12-11 ENCOUNTER — Ambulatory Visit: Payer: BC Managed Care – PPO | Admitting: Internal Medicine

## 2014-12-14 ENCOUNTER — Ambulatory Visit: Payer: BC Managed Care – PPO | Admitting: Internal Medicine

## 2015-01-01 ENCOUNTER — Ambulatory Visit (INDEPENDENT_AMBULATORY_CARE_PROVIDER_SITE_OTHER): Payer: BC Managed Care – PPO | Admitting: Internal Medicine

## 2015-01-01 ENCOUNTER — Encounter: Payer: Self-pay | Admitting: Internal Medicine

## 2015-01-01 VITALS — BP 116/78 | HR 75 | Temp 98.1°F | Resp 14 | Ht 67.0 in | Wt 218.5 lb

## 2015-01-01 DIAGNOSIS — I10 Essential (primary) hypertension: Secondary | ICD-10-CM

## 2015-01-01 DIAGNOSIS — E669 Obesity, unspecified: Secondary | ICD-10-CM

## 2015-01-01 MED ORDER — HYDROCHLOROTHIAZIDE 25 MG PO TABS
25.0000 mg | ORAL_TABLET | Freq: Every day | ORAL | Status: DC
Start: 2015-01-01 — End: 2015-05-12

## 2015-01-01 MED ORDER — PHENTERMINE HCL 37.5 MG PO TABS: 37.5000 mg | ORAL_TABLET | Freq: Every day | ORAL | Status: DC

## 2015-01-01 NOTE — Progress Notes (Signed)
Patient ID: Laura [name], female   DOB: 08/22/1971, 44 y.o.   MRN: [id number]   Patient Active Problem List   Diagnosis Date Noted  . Essential hypertension 01/03/2015  . Long-term use of high-risk medication 09/09/2014  . Polyarthritis of multiple sites 02/21/2014  . Obesity 02/21/2014    Subjective:  CC:   Chief Complaint  Patient presents with  . Follow-up    did not take the phentermine over the holidays thanksgiving through [place], started back in January    HPI:   Laura [name] is a 44 y.o. female who presents for  Follow up on medical treatment for obesity..  She has been taking phentermine which I  prescribed in conjunction with a calorically reduced det and regular exercise for initial BMI of 40,  Wt 256 lbs in April 2015.  By October she had lost 38 lbs.  She discontinued the medication from Nov to January , due to the the  holidays and resumed it last month . She has been using the Herbal Life meal replacement shakes twice daily and  Has lost 9 lbs in the last month. In addition to the 2 shakes per day,  She is drinkig 4-6 bottles of water,  2 protein snacks a day and one meal .   Her exercise is a brisk 30 minute walk at work 5 days per week.  She feels generally well and has no complaints today.       Past Medical History  Diagnosis Date  . Allergy   . Hypertension     HX of high readings     Past Surgical History  Procedure Laterality Date  . Tonsilectomy/adenoidectomy with myringotomy Bilateral 2011  . Dilation and curettage of uterus  2014 FEB and August       The following portions of the patient's history were reviewed and updated as appropriate: Allergies, current medications, and problem list.    Review of Systems:   Patient denies headache, fevers, malaise, unintentional weight loss, skin rash, eye pain, sinus congestion and sinus pain, sore throat, dysphagia,  hemoptysis , cough, dyspnea, wheezing, chest pain, palpitations, orthopnea, edema,  abdominal pain, nausea, melena, diarrhea, constipation, flank pain, dysuria, hematuria, urinary  Frequency, nocturia, numbness, tingling, seizures,  Focal weakness, Loss of consciousness,  Tremor, insomnia, depression, anxiety, and suicidal ideation.     History   Social History  . Marital Status: Married    Spouse Name: N/A  . Number of Children: N/A  . Years of Education: N/A   Occupational History  . Not on file.   Social History Main Topics  . Smoking status: Never Smoker   . Smokeless tobacco: Never Used  . Alcohol Use: Yes     Comment: occasioally  . Drug Use: No  . Sexual Activity: Yes   Other Topics Concern  . Not on file   Social History Narrative    Objective:  Filed Vitals:   01/01/15 1537  BP: 116/78  Pulse: 75  Temp: 98.1 F (36.7 C)  Resp: 14     General appearance: alert, cooperative and appears stated age Ears: normal TM's and external ear canals both ears Throat: lips, mucosa, and tongue normal; teeth and gums normal Neck: no adenopathy, no carotid bruit, supple, symmetrical, trachea midline and thyroid not enlarged, symmetric, no tenderness/mass/nodules Back: symmetric, no curvature. ROM normal. No CVA tenderness. Lungs: clear to auscultation bilaterally Heart: regular rate and rhythm, S1, S2 normal, no murmur, click, rub or gallop Abdomen: soft,  non-tender; bowel sounds normal; no masses,  no organomegaly Pulses: 2+ and symmetric Skin: Skin color, texture, turgor normal. No rashes or lesions Lymph nodes: Cervical, supraclavicular, and axillary nodes normal.  Assessment and Plan:  Obesity I have congratulated her in reduction of   BMI and encouraged  Continued weight loss with goal of 10% of body weigh over the next 6 months using a low glycemic index diet and regular exercise a minimum of 5 days per week. I have authorized the use of phentermine for 3 more months.  Her minimum wt loss gaol is 11 lbs .  Marland Kitchen Wt Readings from Last 3 Encounters:   01/01/15 218 lb 8 oz (99.111 kg)  09/09/14 218 lb 8 oz (99.111 kg)  04/15/14 240 lb (108.863 kg)      Essential hypertension Well controlled on current regimen. Renal function stable, no changes today.  Lab Results  Component Value Date   CREATININE 1.0 09/09/2014    Lab Results  Component Value Date   NA 140 09/09/2014   K 3.5 09/09/2014   CL 100 09/09/2014   CO2 32 09/09/2014        Updated Medication List Outpatient Encounter Prescriptions as of 01/01/2015  Medication Sig  . hydrochlorothiazide (HYDRODIURIL) 25 MG tablet Take 1 tablet (25 mg total) by mouth daily.  . phentermine (ADIPEX-P) 37.5 MG tablet Take 1 tablet (37.5 mg total) by mouth daily before breakfast. TAKE 1/2  TABLET BY MOUTH TWO TIMES DAILY  . [DISCONTINUED] hydrochlorothiazide (HYDRODIURIL) 25 MG tablet Take 1 tablet (25 mg total) by mouth daily.  . [DISCONTINUED] phentermine (ADIPEX-P) 37.5 MG tablet Take 1 tablet (37.5 mg total) by mouth daily before breakfast. TAKE 1/2  TABLET BY MOUTH TWO TIMES DAILY     Orders Placed This Encounter  Procedures  . HM MAMMOGRAPHY  . HM PAP SMEAR    Return in about 3 months (around 04/01/2015).

## 2015-01-01 NOTE — Patient Instructions (Addendum)
I have authorized the use of phentermine for 3 more months.    Your minimum weight loss goal by return in 3 moths is 11 lbs.  You can do that easily!!  Please have fasting las done prior to your next visit

## 2015-01-01 NOTE — Progress Notes (Signed)
Pre-visit discussion using our clinic review tool. No additional management support is needed unless otherwise documented below in the visit note.  

## 2015-01-03 DIAGNOSIS — I10 Essential (primary) hypertension: Secondary | ICD-10-CM | POA: Insufficient documentation

## 2015-01-03 NOTE — Assessment & Plan Note (Signed)
Well controlled on current regimen. Renal function stable, no changes today.  Lab Results  Component Value Date   CREATININE 1.0 09/09/2014    Lab Results  Component Value Date   NA 140 09/09/2014   K 3.5 09/09/2014   CL 100 09/09/2014   CO2 32 09/09/2014

## 2015-01-03 NOTE — Assessment & Plan Note (Addendum)
I have congratulated her in reduction of   BMI and encouraged  Continued weight loss with goal of 10% of body weigh over the next 6 months using a low glycemic index diet and regular exercise a minimum of 5 days per week. I have authorized the use of phentermine for 3 more months.  Her minimum wt loss gaol is 11 lbs .  Marland Kitchen Wt Readings from Last 3 Encounters:  01/01/15 218 lb 8 oz (99.111 kg)  09/09/14 218 lb 8 oz (99.111 kg)  04/15/14 240 lb (108.863 kg)

## 2015-03-09 NOTE — Op Note (Signed)
PATIENT NAME:  Laura [name], Laura [name] MR#:  [id number] DATE OF BIRTH:  06-Nov-1971  DATE OF PROCEDURE:  06/17/2012  PREOPERATIVE DIAGNOSES:  1. Missed abortion at 55 weeks.  2. O+ blood type.   POSTOPERATIVE DIAGNOSES:  1. Missed abortion at 39 weeks.  2. O+ blood type.   PROCEDURE:  Suction dilatation and curettage.   SURGEON: Alanda Slim. Kyliegh Jester, M.D.   FIRST ASSISTANT: None.   ANESTHESIA: General with LMA.   INDICATIONS: The patient is [name] 44 year old married white female gravida 2, para 0-0-1-0 at 83 weeks' gestation who presents for surgical management of missed abortion. The patient presented over the past 48 hours with bleeding and cramping and subsequent ER evaluation revealed evidence of gestational sac with no obvious fetal embryo. There was an embryonic disk possibly consistent with 6.[redacted] week gestation. Previously, there was fetal cardiac activity noted at 5.[redacted] week gestation.   DESCRIPTION OF PROCEDURE: The patient was brought to the Operating Room where she was placed in the supine position. General anesthesia with LMA was induced without difficulty. She was placed in the dorsal lithotomy position using the candy-cane stirrups. [name] Betadine perineal, intravaginal prep and drape was performed in the standard fashion. [name] red Robinson catheter was used to drain 100 mL of urine from the bladder. [name] weighted speculum was placed into the vagina and [name] single-tooth tenaculum was placed on the anterior lip of the cervix. The cervix was noted to be open to an 18 [place] caliber dilator. The cervix was dilated to [name] #20 Hanks dilator. The uterus did sound to 10 cm. The #10 suction curette was used to remove the tissue from the intrauterine cavity. Several passes were made with production of tissue consistent with POC. Gentle curettage was performed with [name] serrated curette and revealed that no significant residual tissue was left behind. Upon completion of the procedure, all instrumentation was removed from the  vagina. The patient was then awakened, mobilized, and taken to the recovery room in satisfactory condition. Estimated blood loss was 50 mL. IV fluids were 700 mL. Urine output was 100 mL. All instruments, needle, and sponge counts were verified as correct.   ____________________________ Alanda Slim. Sparkle Aube, MD mad:ap D: 06/17/2012 16:43:53 ET T: 06/17/2012 17:52:28 ET JOB#: [id number]  cc: Hassell Done [name]. Myrtie Leuthold, MD, <Dictator> Alanda Slim Jenisse Vullo MD ELECTRONICALLY SIGNED 06/24/2012 14:21

## 2015-03-09 NOTE — H&P (Signed)
PATIENT NAME:  Laura [name], Laura [name] MR#:  [id number] DATE OF BIRTH:  23-Oct-1971  DATE OF ADMISSION:  06/17/2012  PREOPERATIVE DIAGNOSIS: Missed abortion at 10 weeks' gestation.   HISTORY OF PRESENT ILLNESS:  Laura [name] is a 44 year old married white female gravida 2, para 0-0-1-0, who presents at 10+ weeks gestation for surgical management of missed abortion. The patient had developed low backache with onset of bright red bleeding over the weekend and was noted to have an empty gestational sac on ultrasound; quantitative hCG titers were falling. The patient previously had been identified to have a viable intrauterine pregnancy at 5 to [redacted] weeks gestation with fetal cardiac activity. Now the patient is bleeding with clots. She did not pass tissue. Repeat ultrasound demonstrated a gestational sac with what appears to be a residual fetal pole measuring 6.2 weeks by crown-rump length without fetal cardiac activity. The patient is O+ blood type. In light of these findings, she was given options of management for this pregnancy and she wishes to proceed with suction dilation and curettage.   PAST MEDICAL HISTORY:  1. Increased body mass index. 2. Chronic hypertension.  3. History of cervical dysplasia.  4. Perimenstrual migraine headaches. 5. History of HSV-2.   PAST SURGICAL HISTORY: Suction dilatation and curettage.   PAST OB HISTORY: Para 0-0-1-0.   FAMILY HISTORY: Heart disease in father who had bypass surgery. Mother has hypertension. Diabetes mellitus type 2 in mom. No history of colon, ovary, or breast cancer.   SOCIAL HISTORY: The patient does not smoke, does not drink alcohol when pregnant. Does not use drugs. 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   REVIEW OF SYSTEMS: The patient denies recent illness. She denies history of coagulopathy. She denies reactive airway disease.   PHYSICAL EXAMINATION:  VITAL SIGNS: Height 5 feet, 6 inches, weight 260, blood pressure 120/80, heart rate 78.   GENERAL:  The patient is a pleasant and slightly anxious white female who is alert and oriented. Affect is appropriate.   HEENT: Oropharynx is clear.   NECK: Supple. No thyromegaly or adenopathy.   LUNGS: Clear.   HEART: Regular rate and rhythm without murmur.   ABDOMEN: Soft, nontender. No organomegaly. No pelvic masses. No hernias.   PELVIC: Deferred to the OR.   EXTREMITIES: Without clubbing, cyanosis, or edema.   SKIN: Without rash.   MUSCULOSKELETAL: Exam is normal. Blood work notable for O+ blood type.   IMPRESSION:  1. Missed abortion at 22 weeks' gestation.  2. O+ blood type.   PLAN: Suction dilatation and curettage. Date of surgery is 06/17/2012.   CONSENT NOTE: Laura [name] is to undergo suction dilatation and curettage for missed AB. She is understanding of the planned procedure and is aware of and is accepting of all surgical risks which include but are not limited to bleeding, infection, pelvic organ injury with need for repair, blood clot disorders, anesthesia risks, and death. All questions are answered. Informed consent is given. The patient is ready and willing to proceed with surgery as scheduled. ____________________________ Alanda Slim Tionne Carelli, MD mad:ap D: 06/17/2012 14:06:45 ET               T: 06/17/2012 14:39:05 ET               JOB#: [id number] Treysen Sudbeck A Dayanira Giovannetti MD ELECTRONICALLY SIGNED 06/24/2012 14:21

## 2015-03-14 NOTE — H&P (Signed)
PATIENT NAME:  Laura, [name] MR#:  [id number] DATE OF BIRTH:  [birth year]  DATE OF ADMISSION:  12/12/2011  PREOPERATIVE DIAGNOSES:  1. Missed abortion at 29 weeks' gestation.  2. O+ blood type.  HISTORY OF PRESENT ILLNESS:  Laura [name] is a 44 year old white female gravida 1, para zero, last menstrual period 09/24/2011, EDC 06/30/2012, EGA [redacted] weeks with ultrasound verifying missed abortion consistent with seven-week embryo without fetal cardiac activity, who presents for surgical management of this condition. The patient desires to proceed with suction dilation and curettage rather than use medical management. The patient has not had any bleeding or cramping. The patient's blood type is O+.   PAST MEDICAL HISTORY:  1. Increased body mass index.  2. Chronic hypertension. 3. Cervical dysplasia.  4. Perimenstrual migraine headaches.  5. HSV-2.   PAST SURGICAL HISTORY: None.   PAST OB HISTORY: Para zero.   FAMILY HISTORY: Heart disease in father who has had bypass surgery; mother has hypertension. Diabetes mellitus type 2 in mom. No history of colon, ovary, or breast cancer.   SOCIAL HISTORY: The patient does not smoke. She does drink alcohol socially when not pregnant. The patient does not use drugs. 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   REVIEW OF SYSTEMS: The patient denies history of coagulopathy. She denies reactive airway disease. She denies recent illness.     PHYSICAL EXAMINATION:  VITAL SIGNS: Height 5 feet 6 inches, weight 260 pounds, BMI 41.9, blood pressure 119/79, heart rate 82.   GENERAL: The patient is a pleasant and slightly anxious white female who is appropriately stressed with miscarriage status. She is alert and oriented. Affect is appropriate.   HEENT: Oropharynx clear.   NECK: Supple. No thyromegaly or adenopathy.   LUNGS: Clear.   HEART: Regular rate and rhythm without murmur.   ABDOMEN: Soft, protuberant. No organomegaly. No pelvic masses. No hernias.    PELVIC: External genitalia normal. BUS normal. Vagina has good estrogen effect. Cervix is closed and nulliparous. Uterus is midplane, approximately 10 weeks' size.  Fetal heart tones were not audible.  Ultrasound did confirm a seven-week size embryo in a collapsing hourglass sac without fetal cardiac activity.   EXTREMITIES: Without clubbing, cyanosis, or edema.   SKIN: Without rash.   MUSCULOSKELETAL: Normal.  LABORATORY DATA: Blood work: O+ blood type, antibody screen negative, RPR nonreactive, rubella nonimmune, varicella zoster immune.   IMPRESSION:  1. Missed AB at 11 weeks.  2. O+  blood type.  3. Rubella nonimmune.   PLAN: Suction D and C.  Date of surgery: 12/12/2011.  CONSENT NOTE:  Tammatha Cobb is to undergo suction D and C for missed AB. She is understanding of the planned procedure and is aware of and accepting of all surgical risks which include but are not limited to bleeding, infection, pelvic organ injury with need for repair, blood clot disorders, anesthesia risks, and death. All questions are answered. Informed consent is given. The patient is ready and willing to proceed with surgery as scheduled.   ____________________________ Alanda Slim DeFrancesco, MD mad:bjt D: 12/11/2011 15:50:15 ET T: 12/11/2011 16:17:09 ET JOB#: [id number]  cc: Hassell Done A. DeFrancesco, MD, <Dictator> Alanda Slim DEFRANCESCO MD ELECTRONICALLY SIGNED 12/18/2011 12:44

## 2015-03-14 NOTE — Op Note (Signed)
PATIENT NAME:  [name], [name] MR#:  [id number] DATE OF BIRTH:  [birth year]  DATE OF PROCEDURE:  12/12/2011  PREOPERATIVE DIAGNOSES:  1. Missed abortion at [redacted] weeks gestation.  2. Rh-positive.   POSTOPERATIVE DIAGNOSES:  1. Missed abortion at [redacted] weeks gestation.  2. Rh-positive.   OPERATIVE PROCEDURE: Suction dilation and curettage.   SURGEON: Alanda Slim. Rafeef Lau, M.D.   FIRST ASSISTANT: None.   ANESTHESIA: General endotracheal.   INDICATIONS: Laura [name] is a 44 year old married white female gravida 1, para 0 at 10+ weeks gestation who presents for surgical management of missed abortion. The patient was noted to have a nonviable pregnancy with fetal embryo without cardiac activity with crown-rump length of 7+ weeks gestation. She has not encountered any vaginal bleeding or cramping. The patient desires surgical intervention.   FINDINGS AT SURGERY: Midplane retroverted uterus of approximately 10 weeks size. The uterus sounded to 11 cm. Tissue consistent with POC was obtained.  DESCRIPTION OF PROCEDURE: The patient was brought to the Operating Room where she was placed in the supine position. General endotracheal anesthesia was induced without difficulty. She was placed in dorsal lithotomy position using the candy-cane stirrups. A Betadine perineal and intravaginal prep and drape was performed in the standard fashion. A Red Robinson catheter was used to drain 150 mL of urine from the bladder. The uterus was noted to be in the mid plane to retroverted position. The cervix was grasped with a single-tooth tenaculum. The uterus was sounded to 11 cm. Hanks dilators were used up to a #20 [place] caliber to dilate the endocervical canal.  A #10 straight suction curette was then used in the standard fashion to remove the products of conception from the uterine cavity. Three passes were made with the suction curette. A serrated curette was then used to verify that no significant residual tissue was left behind.  One final pass with the suction curette was performed. Upon completion of the procedure, all instrumentation was removed from the vagina. The patient was then awakened, mobilized, and taken to the Recovery Room in satisfactory condition. Estimated blood loss was 50 mL. IV fluids infused were 650 mL. Urine output was 150 mm. All instrument, needle and sponge counts were verified as correct. ____________________________ Alanda Slim. Payton Prinsen, MD mad:slb D: 12/13/2011 13:40:26 ET T: 12/13/2011 14:05:32 ET JOB#: [id number]  cc: Hassell Done A. Larenda Reedy, MD, <Dictator> Alanda Slim Gloyd Happ MD ELECTRONICALLY SIGNED 12/18/2011 12:44

## 2015-04-02 ENCOUNTER — Other Ambulatory Visit: Payer: BC Managed Care – PPO

## 2015-04-05 ENCOUNTER — Other Ambulatory Visit: Payer: BC Managed Care – PPO

## 2015-04-07 ENCOUNTER — Ambulatory Visit: Payer: BC Managed Care – PPO | Admitting: Internal Medicine

## 2015-05-03 ENCOUNTER — Other Ambulatory Visit: Payer: BC Managed Care – PPO

## 2015-05-04 ENCOUNTER — Telehealth: Payer: Self-pay | Admitting: *Deleted

## 2015-05-04 DIAGNOSIS — I10 Essential (primary) hypertension: Secondary | ICD-10-CM

## 2015-05-04 DIAGNOSIS — E669 Obesity, unspecified: Secondary | ICD-10-CM

## 2015-05-04 DIAGNOSIS — Z79899 Other long term (current) drug therapy: Secondary | ICD-10-CM

## 2015-05-04 DIAGNOSIS — R7989 Other specified abnormal findings of blood chemistry: Secondary | ICD-10-CM

## 2015-05-04 NOTE — Telephone Encounter (Signed)
Pt coming tomorrow what labs and dx?

## 2015-05-05 ENCOUNTER — Other Ambulatory Visit (INDEPENDENT_AMBULATORY_CARE_PROVIDER_SITE_OTHER): Payer: BC Managed Care – PPO

## 2015-05-05 ENCOUNTER — Ambulatory Visit: Payer: BC Managed Care – PPO | Admitting: Internal Medicine

## 2015-05-05 DIAGNOSIS — I10 Essential (primary) hypertension: Secondary | ICD-10-CM | POA: Diagnosis not present

## 2015-05-05 DIAGNOSIS — E669 Obesity, unspecified: Secondary | ICD-10-CM

## 2015-05-05 DIAGNOSIS — R7989 Other specified abnormal findings of blood chemistry: Secondary | ICD-10-CM

## 2015-05-05 LAB — COMPREHENSIVE METABOLIC PANEL
ALBUMIN: 3.6 g/dL (ref 3.5–5.2)
ALT: 19 U/L (ref 0–35)
AST: 17 U/L (ref 0–37)
Alkaline Phosphatase: 47 U/L (ref 39–117)
BUN: 11 mg/dL (ref 6–23)
CO2: 33 mEq/L — ABNORMAL HIGH (ref 19–32)
Calcium: 8.9 mg/dL (ref 8.4–10.5)
Chloride: 101 mEq/L (ref 96–112)
Creatinine, Ser: 0.94 mg/dL (ref 0.40–1.20)
GFR: 68.64 mL/min (ref >60.00)
GLUCOSE: 99 mg/dL (ref 70–99)
Potassium: 3.9 mEq/L (ref 3.5–5.1)
Sodium: 137 mEq/L (ref 135–145)
Total Bilirubin: 0.6 mg/dL (ref 0.2–1.2)
Total Protein: 6.3 g/dL (ref 6.0–8.3)

## 2015-05-05 LAB — LIPID PANEL
Cholesterol: 129 mg/dL (ref 0–200)
HDL: 60.1 mg/dL (ref >39.00)
LDL CALC: 55 mg/dL (ref 0–99)
NonHDL: 68.9
Total CHOL/HDL Ratio: 2
Triglycerides: 70 mg/dL (ref 0.0–149.0)
VLDL: 14 mg/dL (ref 0.0–40.0)

## 2015-05-06 LAB — T4 AND TSH
T4, Total: 6.2 ug/dL (ref 4.5–12.0)
TSH: 2.05 u[IU]/mL (ref 0.450–4.500)

## 2015-05-09 ENCOUNTER — Encounter: Payer: Self-pay | Admitting: Internal Medicine

## 2015-05-12 ENCOUNTER — Encounter: Payer: Self-pay | Admitting: Internal Medicine

## 2015-05-12 ENCOUNTER — Ambulatory Visit (INDEPENDENT_AMBULATORY_CARE_PROVIDER_SITE_OTHER): Payer: BC Managed Care – PPO | Admitting: Internal Medicine

## 2015-05-12 VITALS — BP 128/88 | HR 75 | Temp 98.8°F | Resp 14 | Ht 66.0 in | Wt 218.1 lb

## 2015-05-12 DIAGNOSIS — E669 Obesity, unspecified: Secondary | ICD-10-CM

## 2015-05-12 DIAGNOSIS — F419 Anxiety disorder, unspecified: Secondary | ICD-10-CM | POA: Diagnosis not present

## 2015-05-12 DIAGNOSIS — F5105 Insomnia due to other mental disorder: Secondary | ICD-10-CM

## 2015-05-12 MED ORDER — ALPRAZOLAM 0.5 MG PO TABS: 0.5000 mg | ORAL_TABLET | Freq: Every evening | ORAL | Status: DC | PRN

## 2015-05-12 MED ORDER — PHENTERMINE HCL 37.5 MG PO TABS: 37.5000 mg | ORAL_TABLET | Freq: Every day | ORAL | Status: DC

## 2015-05-12 MED ORDER — HYDROCHLOROTHIAZIDE 25 MG PO TABS: 25.0000 mg | ORAL_TABLET | Freq: Every day | ORAL | Status: DC

## 2015-05-12 MED ORDER — ALPRAZOLAM 0.5 MG PO TBDP: 0.5000 mg | ORAL_TABLET | Freq: Every evening | ORAL | Status: DC | PRN

## 2015-05-12 NOTE — Progress Notes (Signed)
Subjective:  Patient ID: Laura [name], female    DOB: 1971-07-23  Age: 44 y.o. MRN: [id number]  CC: The primary encounter diagnosis was Insomnia secondary to anxiety. A diagnosis of Obesity was also pertinent to this visit.  HPI Laura [name] presents for fu on hypertension AND obesity.  Weight is unchanged since October,  AND SHE HAS NOT BEEN  taking phentermine .  Not doing herbal life consistently   Having trouble sleeping due to anxiety , increased stress  Outpatient Prescriptions Prior to Visit  Medication Sig Dispense Refill  . hydrochlorothiazide (HYDRODIURIL) 25 MG tablet Take 1 tablet (25 mg total) by mouth daily. 90 tablet 3  . phentermine (ADIPEX-P) 37.5 MG tablet Take 1 tablet (37.5 mg total) by mouth daily before breakfast. TAKE 1/2  TABLET BY MOUTH TWO TIMES DAILY (Patient not taking: Reported on 05/12/2015) 30 tablet 2   No facility-administered medications prior to visit.    Review of Systems;  Patient denies headache, fevers, malaise, unintentional weight loss, skin rash, eye pain, sinus congestion and sinus pain, sore throat, dysphagia,  hemoptysis , cough, dyspnea, wheezing, chest pain, palpitations, orthopnea, edema, abdominal pain, nausea, melena, diarrhea, constipation, flank pain, dysuria, hematuria, urinary  Frequency, nocturia, numbness, tingling, seizures,  Focal weakness, Loss of consciousness,  Tremor, insomnia, depression, anxiety, and suicidal ideation.      Objective:  BP 128/88 mmHg  Pulse 75  Temp(Src) 98.8 F (37.1 C) (Oral)  Resp 14  Ht 5\' 6"  (1.676 m)  Wt 218 lb 2 oz (98.941 kg)  BMI 35.22 kg/m2  SpO2 99%  BP Readings from Last 3 Encounters:  05/12/15 128/88  01/01/15 116/78  09/09/14 112/78    Wt Readings from Last 3 Encounters:  05/12/15 218 lb 2 oz (98.941 kg)  01/01/15 218 lb 8 oz (99.111 kg)  09/09/14 218 lb 8 oz (99.111 kg)    General appearance: alert, cooperative and appears stated age Neck: no adenopathy, no carotid  bruit, supple, symmetrical, trachea midline and thyroid not enlarged, symmetric, no tenderness/mass/nodules Back: symmetric, no curvature. ROM normal. No CVA tenderness. Lungs: clear to auscultation bilaterally Heart: regular rate and rhythm, S1, S2 normal, no murmur, click, rub or gallop Abdomen: soft, non-tender; bowel sounds normal; no masses,  no organomegaly Psych: affect normal, makes good eye contact. No fidgeting,  Smiles easily.  Denies suicidal thoughts   No results found for: HGBA1C  Lab Results  Component Value Date   CREATININE 0.94 05/05/2015   CREATININE 1.0 09/09/2014   CREATININE 0.8 03/17/2014    Lab Results  Component Value Date   WBC 7.3 03/17/2014   HGB 14.9 03/17/2014   HCT 44.1 03/17/2014   PLT 249.0 03/17/2014   GLUCOSE 99 05/05/2015   CHOL 129 05/05/2015   TRIG 70.0 05/05/2015   HDL 60.10 05/05/2015   LDLCALC 55 05/05/2015   ALT 19 05/05/2015   AST 17 05/05/2015   NA 137 05/05/2015   K 3.9 05/05/2015   CL 101 05/05/2015   CREATININE 0.94 05/05/2015   BUN 11 05/05/2015   CO2 33* 05/05/2015   TSH 2.050 05/05/2015    No results found.  Assessment & Plan:   Problem List Items Addressed This Visit    Obesity    She has had difficulty losing weight due to increased appetite and is requesting to resume  Phentermine.  She is aware of the possible side effects and risks and understands that    The medication will be discontinued if she  has not lost 5% of her body weight over the next 3 months, which , based on today's weight is 11 lbs.  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  The risks and benefits of benzodiazepine use were discussed with patient today including excessive sedation leading to respiratory  depression,  impaired thinking/driving, and addiction.  Patient was advised to avoid concurrent use with alcohol, to use medication only as needed and not to share with others  .        Relevant Medications   ALPRAZolam (XANAX) 0.5 MG tablet     A total of 25 minutes of face to face time was spent with patient more than half of which was spent in counselling about the above mentioned conditions  and coordination of care  I have discontinued Ms. Trueheart's ALPRAZolam. I am also having her start on ALPRAZolam. Additionally, I am having her maintain her hydrochlorothiazide and phentermine.  Meds ordered this encounter  Medications  . hydrochlorothiazide (HYDRODIURIL) 25 MG tablet    Sig: Take 1 tablet (25 mg total) by mouth daily.    Dispense:  90 tablet    Refill:  3  . DISCONTD: ALPRAZolam (NIRAVAM) 0.5 MG dissolvable tablet    Sig: Take 1 tablet (0.5 mg total) by mouth at bedtime as needed for anxiety.    Dispense:  30 tablet    Refill:  5  . phentermine (ADIPEX-P) 37.5 MG tablet    Sig: Take 1 tablet (37.5 mg total) by mouth daily before breakfast. TAKE 1/2  TABLET BY MOUTH TWO TIMES DAILY    Dispense:  30 tablet    Refill:  2  . ALPRAZolam (XANAX) 0.5 MG tablet    Sig: Take 1 tablet (0.5 mg total) by mouth at bedtime as needed for anxiety.    Dispense:  30 tablet    Refill:  3    Medications Discontinued During This Encounter  Medication Reason  . hydrochlorothiazide (HYDRODIURIL) 25 MG tablet Reorder  . phentermine (ADIPEX-P) 37.5 MG tablet Reorder  . ALPRAZolam (NIRAVAM) 0.5 MG dissolvable tablet     Follow-up: Return in about 3 months (around 08/12/2015).   Crecencio Mc, MD

## 2015-05-12 NOTE — Progress Notes (Signed)
Pre-visit discussion using our clinic review tool. No additional management support is needed unless otherwise documented below in the visit note.  

## 2015-05-12 NOTE — Patient Instructions (Signed)
I am prescribing alprazolam, a mild anxiolytic to help you fall asleep  I am also refilling the phentermine for 3 months.  Goal weight lss is 11 lbs minimum over the next  3 months  (5%)   Alprazolam tablets What is this medicine? ALPRAZOLAM (al PRAY zoe lam) is a benzodiazepine. It is used to treat anxiety and panic attacks. This medicine may be used for other purposes; ask your health care provider or pharmacist if you have questions. COMMON BRAND NAME(S): Xanax What should I tell my health care provider before I take this medicine? They need to know if you have any of these conditions: -an alcohol or drug abuse problem -bipolar disorder, depression, psychosis or other mental health conditions -glaucoma -kidney or liver disease -lung or breathing disease -myasthenia gravis -Parkinson's disease -porphyria -seizures or a history of seizures -suicidal thoughts -an unusual or allergic reaction to alprazolam, other benzodiazepines, foods, dyes, or preservatives -pregnant or trying to get pregnant -breast-feeding How should I use this medicine? Take this medicine by mouth with a glass of water. Follow the directions on the prescription label. Take your medicine at regular intervals. Do not take it more often than directed. If you have been taking this medicine regularly for some time, do not suddenly stop taking it. You must gradually reduce the dose or you may get severe side effects. Ask your doctor or health care professional for advice. Even after you stop taking this medicine it can still affect your body for several days. Talk to your pediatrician regarding the use of this medicine in children. Special care may be needed. Overdosage: If you think you have taken too much of this medicine contact a poison control center or emergency room at once. NOTE: This medicine is only for you. Do not share this medicine with others. What if I miss a dose? If you miss a dose, take it as soon as you  can. If it is almost time for your next dose, take only that dose. Do not take double or extra doses. What may interact with this medicine? Do not take this medicine with any of the following medications: -certain medicines for HIV infection or AIDS -ketoconazole -itraconazole This medicine may also interact with the following medications: -birth control pills -certain macrolide antibiotics like clarithromycin, erythromycin, troleandomycin -cimetidine -cyclosporine -ergotamine -grapefruit juice -herbal or dietary supplements like kava kava, melatonin, dehydroepiandrosterone, DHEA, St. John's Wort or valerian -imatinib, STI-571 -isoniazid -levodopa -medicines for depression, anxiety, or psychotic disturbances -prescription pain medicines -rifampin, rifapentine, or rifabutin -some medicines for blood pressure or heart problems -some medicines for seizures like carbamazepine, oxcarbazepine, phenobarbital, phenytoin, primidone This list may not describe all possible interactions. Give your health care provider a list of all the medicines, herbs, non-prescription drugs, or dietary supplements you use. Also tell them if you smoke, drink alcohol, or use illegal drugs. Some items may interact with your medicine. What should I watch for while using this medicine? Visit your doctor or health care professional for regular checks on your progress. Your body can become dependent on this medicine. Ask your doctor or health care professional if you still need to take it. You may get drowsy or dizzy. Do not drive, use machinery, or do anything that needs mental alertness until you know how this medicine affects you. To reduce the risk of dizzy and fainting spells, do not stand or sit up quickly, especially if you are an older patient. Alcohol may increase dizziness and drowsiness. Avoid alcoholic drinks.  Do not treat yourself for coughs, colds or allergies without asking your doctor or health care  professional for advice. Some ingredients can increase possible side effects. What side effects may I notice from receiving this medicine? Side effects that you should report to your doctor or health care professional as soon as possible: -allergic reactions like skin rash, itching or hives, swelling of the face, lips, or tongue -confusion, forgetfulness -depression -difficulty sleeping -difficulty speaking -feeling faint or lightheaded, falls -mood changes, excitability or aggressive behavior -muscle cramps -trouble passing urine or change in the amount of urine -unusually weak or tired Side effects that usually do not require medical attention (report to your doctor or health care professional if they continue or are bothersome): -change in sex drive or performance -changes in appetite This list may not describe all possible side effects. Call your doctor for medical advice about side effects. You may report side effects to FDA at 1-800-FDA-1088. Where should I keep my medicine? Keep out of the reach of children. This medicine can be abused. Keep your medicine in a safe place to protect it from theft. Do not share this medicine with anyone. Selling or giving away this medicine is dangerous and against the law. Store at room temperature between 20 and 25 degrees C (68 and 77 degrees F). Throw away any unused medicine after the expiration date. NOTE: This sheet is a summary. It may not cover all possible information. If you have questions about this medicine, talk to your doctor, pharmacist, or health care provider.  2015, Elsevier/Gold Standard. (2008-01-30 10:34:46)

## 2015-05-16 NOTE — Assessment & Plan Note (Signed)
Low dose alprazolam to use prn.  The risks and benefits of benzodiazepine use were discussed with patient today including excessive sedation leading to respiratory depression,  impaired thinking/driving, and addiction.  Patient was advised to avoid concurrent use with alcohol, to use medication only as needed and not to share with others  .

## 2015-05-16 NOTE — Assessment & Plan Note (Signed)
She has had difficulty losing weight due to increased appetite and is requesting to resume  Phentermine.  She is aware of the possible side effects and risks and understands that    The medication will be discontinued if she has not lost 5% of her body weight over the next 3 months, which , based on today's weight is 11 lbs.  Wt Readings from Last 3 Encounters:  05/12/15 218 lb 2 oz (98.941 kg)  01/01/15 218 lb 8 oz (99.111 kg)  09/09/14 218 lb 8 oz (99.111 kg)

## 2015-08-03 ENCOUNTER — Ambulatory Visit (INDEPENDENT_AMBULATORY_CARE_PROVIDER_SITE_OTHER): Payer: BC Managed Care – PPO | Admitting: Obstetrics and Gynecology

## 2015-08-03 ENCOUNTER — Encounter: Payer: Self-pay | Admitting: Obstetrics and Gynecology

## 2015-08-03 VITALS — BP 116/82 | HR 83 | Ht 66.0 in | Wt 220.9 lb

## 2015-08-03 DIAGNOSIS — Z01419 Encounter for gynecological examination (general) (routine) without abnormal findings: Secondary | ICD-10-CM

## 2015-08-03 DIAGNOSIS — Z1231 Encounter for screening mammogram for malignant neoplasm of breast: Secondary | ICD-10-CM | POA: Diagnosis not present

## 2015-08-03 DIAGNOSIS — Z30431 Encounter for routine checking of intrauterine contraceptive device: Secondary | ICD-10-CM

## 2015-08-03 DIAGNOSIS — E669 Obesity, unspecified: Secondary | ICD-10-CM

## 2015-08-03 DIAGNOSIS — Z0000 Encounter for general adult medical examination without abnormal findings: Secondary | ICD-10-CM

## 2015-08-03 MED ORDER — EPINEPHRINE 0.3 MG/0.3ML IJ SOAJ: 0.3000 mg | Freq: Once | INTRAMUSCULAR | Status: DC

## 2015-08-03 NOTE — Patient Instructions (Signed)
1.  No Pap needed. 2.  Mammogram to be scheduled. 3.  Encourage healthy eating and exercise promote weight loss. 4.  Mirena IUD good through 2019. 5.  Return in 1 year or when necessary

## 2015-08-03 NOTE — Progress Notes (Signed)
Patient ID: Laura [name], female   DOB: 1971-07-19, 44 y.o.   MRN: [id number] ANNUAL PREVENTATIVE CARE GYN  ENCOUNTER NOTE  Subjective:       Laura [name] is a 44 y.o. G60P0020 female here for a routine annual gynecologic exam.  Current complaints: 1.  none    Gynecologic History No LMP recorded. Patient is not currently having periods (Reason: IUD). Contraception: Mirena IUD- inserted- 2014 Last Pap: 06/2013 neg/neg. Results were: normal Last mammogram: birad 2  Results were: normal  Obstetric History OB History  Gravida Para Term Preterm AB SAB TAB Ectopic Multiple Living  2    2 0        # Outcome Date GA Lbr Len/2nd Weight Sex Delivery Anes PTL Lv  2 AB              Complications: Missed abortion  1 AB              Complications: Missed abortion      Past Medical History  Diagnosis Date  . Allergy   . Hypertension     HX of high readings   . Menstrual migraine   . Herpes genitalia   . Dysplasia of cervix   . Increased BMI     Past Surgical History  Procedure Laterality Date  . Tonsilectomy/adenoidectomy with myringotomy Bilateral 2011  . Dilation and curettage of uterus  2014 FEB and August  . Mouth surgery      Current Outpatient Prescriptions on File Prior to Visit  Medication Sig Dispense Refill  . ALPRAZolam (XANAX) 0.5 MG tablet Take 1 tablet (0.5 mg total) by mouth at bedtime as needed for anxiety. 30 tablet 3  . hydrochlorothiazide (HYDRODIURIL) 25 MG tablet Take 1 tablet (25 mg total) by mouth daily. 90 tablet 3  . levonorgestrel (MIRENA) 20 MCG/24HR IUD 1 each by Intrauterine route once.     No current facility-administered medications on file prior to visit.    No Known Allergies  Social History   Social History  . Marital Status: Married    Spouse Name: N/A  . Number of Children: N/A  . 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 Smoking status: Never Smoker   .  Smokeless tobacco: Never Used  . Alcohol Use: Yes     Comment: occasioally  . Drug Use: No  . Sexual Activity: Yes    Birth Control/ Protection: IUD   Other Topics Concern  . Not on file   Social History Narrative    Family History  Problem Relation Age of Onset  . Arthritis Mother   . Hypertension Mother   . Diabetes Mother   . Heart disease Father   . Hyperlipidemia Father   . Hypertension Father   . Cancer Neg Hx     The following portions of the patient's history were reviewed and updated as appropriate: allergies, current medications, past family history, past medical history, past social history, past surgical history and problem list.  Review of Systems ROS Review of Systems - General ROS: negative for - chills, fatigue, fever, hot flashes, night sweats, weight gain or weight loss Psychological ROS: negative for - anxiety, decreased libido, depression, mood swings, physical abuse or sexual abuse Ophthalmic ROS: negative for - blurry vision, eye pain or loss of vision ENT ROS: negative for - headaches, hearing change, visual changes or vocal changes Allergy and Immunology ROS: negative  for - hives, itchy/watery eyes or seasonal allergies Hematological and Lymphatic ROS: negative for - bleeding problems, bruising, swollen lymph nodes or weight loss Endocrine ROS: negative for - galactorrhea, hair pattern changes, hot flashes, malaise/lethargy, mood swings, palpitations, polydipsia/polyuria, skin changes, temperature intolerance or unexpected weight changes Breast ROS: negative for - new or changing breast lumps or nipple discharge Respiratory ROS: negative for - cough or shortness of breath Cardiovascular ROS: negative for - chest pain, irregular heartbeat, palpitations or shortness of breath Gastrointestinal ROS: no abdominal pain, change in bowel habits, or black or bloody stools Genito-Urinary ROS: no dysuria, trouble voiding, or hematuria Musculoskeletal ROS: negative  for - joint pain or joint stiffness Neurological ROS: negative for - bowel and bladder control changes Dermatological ROS: negative for rash and skin lesion changes   Objective:   BP 116/82 mmHg  Pulse 83  Ht 5\' 6"  (1.676 m)  Wt 220 lb 14.4 oz (100.2 kg)  BMI 35.67 kg/m2 CONSTITUTIONAL: Well-developed, well-nourished female in no acute distress.  PSYCHIATRIC: Normal mood and affect. Normal behavior. Normal judgment and thought content. [place]: Alert and oriented to person, place, and time. Normal muscle tone coordination. No cranial nerve deficit noted. HENT:  Normocephalic, atraumatic, External right and left ear normal. Oropharynx is clear and moist EYES: Conjunctivae and EOM are normal. Pupils are equal, round, and reactive to light. No scleral icterus.  NECK: Normal range of motion, supple, no masses.  Normal thyroid.  SKIN: Skin is warm and dry. No rash noted. Not diaphoretic. No erythema. No pallor. CARDIOVASCULAR: Normal heart rate noted, regular rhythm, no murmur. RESPIRATORY: Clear to auscultation bilaterally. Effort and breath sounds normal, no problems with respiration noted. BREASTS: Symmetric in size. No masses, skin changes, nipple drainage, or lymphadenopathy. ABDOMEN: Soft, normal bowel sounds, no distention noted.  No tenderness, rebound or guarding.  BLADDER: Normal PELVIC:  External Genitalia: Normal  BUS: Normal  Vagina: Normal  Cervix: Normal; IUD string 3 cm; no cervical motion tenderness  Uterus: Normal; midplane, normal size and shape, mobile  Adnexa: Normal  RV: External Exam NormaI, No Rectal Masses and Normal Sphincter tone  MUSCULOSKELETAL: Normal range of motion. No tenderness.  No cyanosis, clubbing, or edema.  2+ distal pulses. LYMPHATIC: No Axillary, Supraclavicular, or Inguinal Adenopathy.    Assessment:   Annual gynecologic examination 44 y.o. Contraception: IUD bmi 35  Plan:  Pap: Not needed Mammogram: Ordered Stool Guaiac Testing:   Not Indicated Labs: thru pcp Routine preventative health maintenance measures emphasized: Exercise/Diet/Weight control, Tobacco Warnings, Alcohol/Substance use risks and Stress Management Weight loss encouraged through Healthy eating and exercise Return to [place], CMA  Brayton Mars, MD

## 2015-08-04 ENCOUNTER — Ambulatory Visit: Payer: BC Managed Care – PPO | Admitting: Internal Medicine

## 2015-09-20 ENCOUNTER — Ambulatory Visit
Admission: RE | Admit: 2015-09-20 | Discharge: 2015-09-20 | Disposition: A | Discharge status: Home / Self Care | Payer: BC Managed Care – PPO | Source: Ambulatory Visit | Attending: Obstetrics and Gynecology | Admitting: Obstetrics and Gynecology

## 2015-09-20 ENCOUNTER — Other Ambulatory Visit: Payer: Self-pay | Admitting: Obstetrics and Gynecology

## 2015-09-20 DIAGNOSIS — Z1231 Encounter for screening mammogram for malignant neoplasm of breast: Secondary | ICD-10-CM | POA: Diagnosis not present

## 2016-08-07 NOTE — Progress Notes (Deleted)
ANNUAL PREVENTATIVE CARE GYN  ENCOUNTER NOTE  Subjective:       Laura [name] is a 45 y.o. G59P0020 female here for a routine annual gynecologic exam.  Current complaints: 1.  none   Gynecologic History No LMP recorded. Patient is not currently having periods (Reason: IUD). Contraception: IUD- mirena inserted 2014 Last Pap: 2014 neg/neg. Results were: normal Last mammogram: 08/2015 birad 1. Results were: normal  Obstetric History OB History  Gravida Para Term Preterm AB Living  2       2    SAB TAB Ectopic Multiple Live Births  0            # Outcome Date GA Lbr Len/2nd Weight Sex Delivery Anes PTL Lv  2 AB              Complications: Missed abortion  1 AB              Complications: Missed abortion      Past Medical History:  Diagnosis Date  . Allergy   . Dysplasia of cervix   . Herpes genitalia   . Hypertension    HX of high readings   . Increased BMI   . Menstrual migraine     Past Surgical History:  Procedure Laterality Date  . DILATION AND CURETTAGE OF UTERUS  2014 FEB and August  . MOUTH SURGERY    . TONSILECTOMY/ADENOIDECTOMY WITH MYRINGOTOMY Bilateral 2011    Current Outpatient Prescriptions on File Prior to Visit  Medication Sig Dispense Refill  . ALPRAZolam (XANAX) 0.5 MG tablet Take 1 tablet (0.5 mg total) by mouth at bedtime as needed for anxiety. 30 tablet 3  . EPINEPHrine 0.3 mg/0.3 mL IJ SOAJ injection Inject 0.3 mLs (0.3 mg total) into the muscle once. 1 Device 0  . hydrochlorothiazide (HYDRODIURIL) 25 MG tablet Take 1 tablet (25 mg total) by mouth daily. 90 tablet 3  . levonorgestrel (MIRENA) 20 MCG/24HR IUD 1 each by Intrauterine route once.     No current facility-administered medications on file prior to visit.     No Known Allergies  Social History   Social History  . Marital status: Married    Spouse name: N/A  . Number of children: N/A  . 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 Smoking status: Never Smoker  . Smokeless tobacco: Never Used  . Alcohol use Yes     Comment: occasioally  . Drug use: No  . Sexual activity: Yes    Birth control/ protection: IUD   Other Topics Concern  . Not on file   Social History Narrative  . No narrative on file    Family History  Problem Relation Age of Onset  . Arthritis Mother   . Hypertension Mother   . Diabetes Mother   . Heart disease Father   . Hyperlipidemia Father   . Hypertension Father   . Cancer Neg Hx     The following portions of the patient's history were reviewed and updated as appropriate: allergies, current medications, past family history, past medical history, past social history, past surgical history and problem list.  Review of Systems ROS Review of Systems - General ROS: negative for - chills, fatigue, fever, hot flashes, night sweats, weight gain or weight loss Psychological ROS: negative for - anxiety, decreased libido, depression, mood swings, physical abuse or sexual abuse Ophthalmic ROS: negative for - blurry vision, eye  pain or loss of vision ENT ROS: negative for - headaches, hearing change, visual changes or vocal changes Allergy and Immunology ROS: negative for - hives, itchy/watery eyes or seasonal allergies Hematological and Lymphatic ROS: negative for - bleeding problems, bruising, swollen lymph nodes or weight loss Endocrine ROS: negative for - galactorrhea, hair pattern changes, hot flashes, malaise/lethargy, mood swings, palpitations, polydipsia/polyuria, skin changes, temperature intolerance or unexpected weight changes Breast ROS: negative for - new or changing breast lumps or nipple discharge Respiratory ROS: negative for - cough or shortness of breath Cardiovascular ROS: negative for - chest pain, irregular heartbeat, palpitations or shortness of breath Gastrointestinal ROS: no abdominal pain, change in bowel habits, or black or bloody  stools Genito-Urinary ROS: no dysuria, trouble voiding, or hematuria Musculoskeletal ROS: negative for - joint pain or joint stiffness Neurological ROS: negative for - bowel and bladder control changes Dermatological ROS: negative for rash and skin lesion changes   Objective:   There were no vitals taken for this visit. CONSTITUTIONAL: Well-developed, well-nourished female in no acute distress.  PSYCHIATRIC: Normal mood and affect. Normal behavior. Normal judgment and thought content. [place]: Alert and oriented to person, place, and time. Normal muscle tone coordination. No cranial nerve deficit noted. HENT:  Normocephalic, atraumatic, External right and left ear normal. Oropharynx is clear and moist EYES: Conjunctivae and EOM are normal. Pupils are equal, round, and reactive to light. No scleral icterus.  NECK: Normal range of motion, supple, no masses.  Normal thyroid.  SKIN: Skin is warm and dry. No rash noted. Not diaphoretic. No erythema. No pallor. CARDIOVASCULAR: Normal heart rate noted, regular rhythm, no murmur. RESPIRATORY: Clear to auscultation bilaterally. Effort and breath sounds normal, no problems with respiration noted. BREASTS: Symmetric in size. No masses, skin changes, nipple drainage, or lymphadenopathy. ABDOMEN: Soft, normal bowel sounds, no distention noted.  No tenderness, rebound or guarding.  BLADDER: Normal PELVIC:  External Genitalia: Normal  BUS: Normal  Vagina: Normal  Cervix: Normal  Uterus: Normal  Adnexa: Normal  RV: {Blank multiple:19196::"External Exam NormaI","No Rectal Masses","Normal Sphincter tone"}  MUSCULOSKELETAL: Normal range of motion. No tenderness.  No cyanosis, clubbing, or edema.  2+ distal pulses. LYMPHATIC: No Axillary, Supraclavicular, or Inguinal Adenopathy.    Assessment:   Annual gynecologic examination 45 y.o. Contraception: IUD BMI- 35 Problem List Items Addressed This Visit    None    Visit Diagnoses    Well woman  exam    -  Primary   Encounter for screening mammogram for breast cancer       Encounter for routine checking of intrauterine contraceptive device       Obesity (BMI 35.0-39.9 without comorbidity) ([place])          Plan:  Pap: Pap Co Test Mammogram: Ordered Stool Guaiac Testing:  Not Indicated Labs: thru pcp Routine preventative health maintenance measures emphasized: {Blank multiple:19196::"Exercise/Diet/Weight control","Tobacco Warnings","Alcohol/Substance use risks","Stress Management","Peer Pressure Issues","Safe Sex"} *** Return to Clinic - [facility], [place]

## 2016-08-08 ENCOUNTER — Encounter: Payer: BC Managed Care – PPO | Admitting: Obstetrics and Gynecology

## 2016-08-28 NOTE — Progress Notes (Deleted)
ANNUAL PREVENTATIVE CARE GYN  ENCOUNTER NOTE  Subjective:       Laura [name] is a 45 y.o. G34P0020 female here for a routine annual gynecologic exam.  Current complaints: 1.  ***    Gynecologic History No LMP recorded. Patient is not currently having periods (Reason: IUD). Contraception: IUD Last Pap: 06/2014 neg. Results were: normal Last mammogram: 08/2015 birad 1. Results were: normal  Obstetric History OB History  Gravida Para Term Preterm AB Living  2       2    SAB TAB Ectopic Multiple Live Births  0            # Outcome Date GA Lbr Len/2nd Weight Sex Delivery Anes PTL Lv  2 AB              Complications: Missed abortion  1 AB              Complications: Missed abortion      Past Medical History:  Diagnosis Date  . Allergy   . Dysplasia of cervix   . Herpes genitalia   . Hypertension    HX of high readings   . Increased BMI   . Menstrual migraine     Past Surgical History:  Procedure Laterality Date  . DILATION AND CURETTAGE OF UTERUS  2014 FEB and August  . MOUTH SURGERY    . TONSILECTOMY/ADENOIDECTOMY WITH MYRINGOTOMY Bilateral 2011    Current Outpatient Prescriptions on File Prior to Visit  Medication Sig Dispense Refill  . ALPRAZolam (XANAX) 0.5 MG tablet Take 1 tablet (0.5 mg total) by mouth at bedtime as needed for anxiety. 30 tablet 3  . EPINEPHrine 0.3 mg/0.3 mL IJ SOAJ injection Inject 0.3 mLs (0.3 mg total) into the muscle once. 1 Device 0  . hydrochlorothiazide (HYDRODIURIL) 25 MG tablet Take 1 tablet (25 mg total) by mouth daily. 90 tablet 3  . levonorgestrel (MIRENA) 20 MCG/24HR IUD 1 each by Intrauterine route once.     No current facility-administered medications on file prior to visit.     No Known Allergies  Social History   Social History  . Marital status: Married    Spouse name: N/A  . Number of children: N/A  . 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  Smoking status: Never Smoker  . Smokeless tobacco: Never Used  . Alcohol use Yes     Comment: occasioally  . Drug use: No  . Sexual activity: Yes    Birth control/ protection: IUD   Other Topics Concern  . Not on file   Social History Narrative  . No narrative on file    Family History  Problem Relation Age of Onset  . Arthritis Mother   . Hypertension Mother   . Diabetes Mother   . Heart disease Father   . Hyperlipidemia Father   . Hypertension Father   . Cancer Neg Hx     The following portions of the patient's history were reviewed and updated as appropriate: allergies, current medications, past family history, past medical history, past social history, past surgical history and problem list.  Review of Systems ROS Review of Systems - General ROS: negative for - chills, fatigue, fever, hot flashes, night sweats, weight gain or weight loss Psychological ROS: negative for - anxiety, decreased libido, depression, mood swings, physical abuse or sexual abuse Ophthalmic ROS: negative for - blurry vision, eye pain or  loss of vision ENT ROS: negative for - headaches, hearing change, visual changes or vocal changes Allergy and Immunology ROS: negative for - hives, itchy/watery eyes or seasonal allergies Hematological and Lymphatic ROS: negative for - bleeding problems, bruising, swollen lymph nodes or weight loss Endocrine ROS: negative for - galactorrhea, hair pattern changes, hot flashes, malaise/lethargy, mood swings, palpitations, polydipsia/polyuria, skin changes, temperature intolerance or unexpected weight changes Breast ROS: negative for - new or changing breast lumps or nipple discharge Respiratory ROS: negative for - cough or shortness of breath Cardiovascular ROS: negative for - chest pain, irregular heartbeat, palpitations or shortness of breath Gastrointestinal ROS: no abdominal pain, change in bowel habits, or black or bloody stools Genito-Urinary ROS: no dysuria,  trouble voiding, or hematuria Musculoskeletal ROS: negative for - joint pain or joint stiffness Neurological ROS: negative for - bowel and bladder control changes Dermatological ROS: negative for rash and skin lesion changes   Objective:   There were no vitals taken for this visit. CONSTITUTIONAL: Well-developed, well-nourished female in no acute distress.  PSYCHIATRIC: Normal mood and affect. Normal behavior. Normal judgment and thought content. [place]: Alert and oriented to person, place, and time. Normal muscle tone coordination. No cranial nerve deficit noted. HENT:  Normocephalic, atraumatic, External right and left ear normal. Oropharynx is clear and moist EYES: Conjunctivae and EOM are normal. Pupils are equal, round, and reactive to light. No scleral icterus.  NECK: Normal range of motion, supple, no masses.  Normal thyroid.  SKIN: Skin is warm and dry. No rash noted. Not diaphoretic. No erythema. No pallor. CARDIOVASCULAR: Normal heart rate noted, regular rhythm, no murmur. RESPIRATORY: Clear to auscultation bilaterally. Effort and breath sounds normal, no problems with respiration noted. BREASTS: Symmetric in size. No masses, skin changes, nipple drainage, or lymphadenopathy. ABDOMEN: Soft, normal bowel sounds, no distention noted.  No tenderness, rebound or guarding.  BLADDER: Normal PELVIC:  External Genitalia: Normal  BUS: Normal  Vagina: Normal  Cervix: Normal  Uterus: Normal  Adnexa: Normal  RV: {Blank multiple:19196::"External Exam NormaI","No Rectal Masses","Normal Sphincter tone"}  MUSCULOSKELETAL: Normal range of motion. No tenderness.  No cyanosis, clubbing, or edema.  2+ distal pulses. LYMPHATIC: No Axillary, Supraclavicular, or Inguinal Adenopathy.    Assessment:   Annual gynecologic examination 45 y.o. Contraception: IUD bmi-35 Problem List Items Addressed This Visit    None    Visit Diagnoses    Well woman exam    -  Primary   Encounter for  screening mammogram for breast cancer       Encounter for routine checking of intrauterine contraceptive device       Obesity (BMI 35.0-39.9 without comorbidity)          Plan:  Pap: Pap Co Test Mammogram: Ordered Stool Guaiac Testing:  Not Indicated Labs: ? Routine preventative health maintenance measures emphasized: {Blank multiple:19196::"Exercise/Diet/Weight control","Tobacco Warnings","Alcohol/Substance use risks","Stress Management","Peer Pressure Issues","Safe Sex"} *** Return to Clinic - [place], [place]

## 2016-08-31 ENCOUNTER — Encounter: Payer: BC Managed Care – PPO | Admitting: Obstetrics and Gynecology

## 2016-09-25 NOTE — Progress Notes (Signed)
ANNUAL PREVENTATIVE CARE GYN  ENCOUNTER NOTE  Subjective:       Laura [name] is a 45 y.o. G26P0020 female here for a routine annual gynecologic exam.  Current complaints: 1.   Stings are poking partner  Occasional vasomotor symptoms are noted. Still having occasional spotting   Gynecologic History No LMP recorded. Patient is not currently having periods (Reason: IUD). Contraception: IUD Last Pap: 06/2014 neg. Results were: normal Last mammogram: 08/2015 birad 1. Results were: normal  Obstetric History OB History  Gravida Para Term Preterm AB Living  2       2    SAB TAB Ectopic Multiple Live Births  0            # Outcome Date GA Lbr Len/2nd Weight Sex Delivery Anes PTL Lv  2 AB              Complications: Missed abortion  1 AB              Complications: Missed abortion      Past Medical History:  Diagnosis Date  . Allergy   . Dysplasia of cervix   . Herpes genitalia   . Hypertension    HX of high readings   . Increased BMI   . Menstrual migraine     Past Surgical History:  Procedure Laterality Date  . DILATION AND CURETTAGE OF UTERUS  2014 FEB and August  . MOUTH SURGERY    . TONSILECTOMY/ADENOIDECTOMY WITH MYRINGOTOMY Bilateral 2011    Current Outpatient Prescriptions on File Prior to Visit  Medication Sig Dispense Refill  . ALPRAZolam (XANAX) 0.5 MG tablet Take 1 tablet (0.5 mg total) by mouth at bedtime as needed for anxiety. 30 tablet 3  . EPINEPHrine 0.3 mg/0.3 mL IJ SOAJ injection Inject 0.3 mLs (0.3 mg total) into the muscle once. 1 Device 0  . hydrochlorothiazide (HYDRODIURIL) 25 MG tablet Take 1 tablet (25 mg total) by mouth daily. 90 tablet 3  . levonorgestrel (MIRENA) 20 MCG/24HR IUD 1 each by Intrauterine route once.     No current facility-administered medications on file prior to visit.     No Known Allergies  Social History   Social History  . Marital status: Married    Spouse name: N/A  . Number of children: N/A  . 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 Smokeless tobacco: Never Used  . Alcohol use Yes     Comment: occasioally  . Drug use: No  . Sexual activity: Yes    Birth control/ protection: IUD   Other Topics Concern  . Not on file   Social History Narrative  . No narrative on file    Family History  Problem Relation Age of Onset  . Arthritis Mother   . Hypertension Mother   . Diabetes Mother   . Heart disease Father   . Hyperlipidemia Father   . Hypertension Father   . Cancer Neg Hx     The following portions of the patient's history were reviewed and updated as appropriate: allergies, current medications, past family history, past medical history, past social history, past surgical history and problem list.  Review of Systems ROS Review of Systems - General ROS: negative for - chills, fatigue, fever, hot flashes, night sweats, weight gain or weight loss Psychological ROS: negative for - anxiety, decreased libido, depression, mood swings, physical abuse  or sexual abuse Ophthalmic ROS: negative for - blurry vision, eye pain or loss of vision ENT ROS: negative for - headaches, hearing change, visual changes or vocal changes Allergy and Immunology ROS: negative for - hives, itchy/watery eyes or seasonal allergies Hematological and Lymphatic ROS: negative for - bleeding problems, bruising, swollen lymph nodes or weight loss Endocrine ROS: negative for - galactorrhea, hair pattern changes, hot flashes, malaise/lethargy, mood swings, palpitations, polydipsia/polyuria, skin changes, temperature intolerance or unexpected weight changes Breast ROS: negative for - new or changing breast lumps or nipple discharge Respiratory ROS: negative for - cough or shortness of breath Cardiovascular ROS: negative for - chest pain, irregular heartbeat, palpitations or shortness of breath Gastrointestinal ROS:  no abdominal pain, change in bowel habits, or black or bloody stools Genito-Urinary ROS: no dysuria, trouble voiding, or hematuria Musculoskeletal ROS: negative for - joint pain or joint stiffness Neurological ROS: negative for - bowel and bladder control changes Dermatological ROS: negative for rash and skin lesion changes   Objective:  BP (!) 142/84   Pulse 69   Ht 5\' 6"  (1.676 m)   Wt 251 lb 11.2 oz (114.2 kg)   BMI 40.63 kg/m  CONSTITUTIONAL: Well-developed, well-nourished female in no acute distress.  PSYCHIATRIC: Normal mood and affect. Normal behavior. Normal judgment and thought content. [place]: Alert and oriented to person, place, and time. Normal muscle tone coordination. No cranial nerve deficit noted. HENT:  Normocephalic, atraumatic, External right and left ear normal. Oropharynx is clear and moist EYES: Conjunctivae and EOM are normal. Pupils are equal, round, and reactive to light. No scleral icterus.  NECK: Normal range of motion, supple, no masses.  Normal thyroid.  SKIN: Skin is warm and dry. No rash noted. Not diaphoretic. No erythema. No pallor. CARDIOVASCULAR: Normal heart rate noted, regular rhythm, no murmur. RESPIRATORY: Clear to auscultation bilaterally. Effort and breath sounds normal, no problems with respiration noted. BREASTS: Symmetric in size. No masses, skin changes, nipple drainage, or lymphadenopathy. ABDOMEN: Soft, normal bowel sounds, no distention noted.  No tenderness, rebound or guarding.  BLADDER: Normal PELVIC:  External Genitalia: Normal  BUS: Normal  Vagina: Normal  Cervix: Normal; IUD strings 3 cm, slightly firm on palpation  Uterus: Normal; midplane, normal size and shape, mobile, nontender  Adnexa: Normal; nonpalpable and nontender  RV: External Exam NormaI, No Rectal Masses and Normal Sphincter tone  MUSCULOSKELETAL: Normal range of motion. No tenderness.  No cyanosis, clubbing, or edema.  2+ distal pulses. LYMPHATIC: No Axillary,  Supraclavicular, or Inguinal Adenopathy.    Assessment:   Annual gynecologic examination 45 y.o. Contraception: IUD bmi-35; 30 pound weight gain this year   Plan:  Pap: Pap Co Test Mammogram: Ordered Stool Guaiac Testing:  Not Indicated Labs: thru pcp Routine preventative health maintenance measures emphasized: Exercise/Diet/Weight control, Tobacco Warnings and Alcohol/Substance use risks Return if IUD strings remain still symptomatic with partner-will trim as needed. Return to [place], [place]  Brayton Mars, MD  Note: This dictation was prepared with Dragon dictation along with smaller phrase technology. Any transcriptional errors that result from this process are unintentional.

## 2016-09-26 ENCOUNTER — Other Ambulatory Visit: Payer: Self-pay | Admitting: Obstetrics and Gynecology

## 2016-09-26 ENCOUNTER — Ambulatory Visit (INDEPENDENT_AMBULATORY_CARE_PROVIDER_SITE_OTHER): Payer: BC Managed Care – PPO | Admitting: Obstetrics and Gynecology

## 2016-09-26 ENCOUNTER — Encounter: Payer: Self-pay | Admitting: Obstetrics and Gynecology

## 2016-09-26 VITALS — BP 142/84 | HR 69 | Ht 66.0 in | Wt 251.7 lb

## 2016-09-26 DIAGNOSIS — E669 Obesity, unspecified: Secondary | ICD-10-CM

## 2016-09-26 DIAGNOSIS — Z1231 Encounter for screening mammogram for malignant neoplasm of breast: Secondary | ICD-10-CM

## 2016-09-26 DIAGNOSIS — Z30431 Encounter for routine checking of intrauterine contraceptive device: Secondary | ICD-10-CM | POA: Diagnosis not present

## 2016-09-26 DIAGNOSIS — Z01419 Encounter for gynecological examination (general) (routine) without abnormal findings: Secondary | ICD-10-CM | POA: Diagnosis not present

## 2016-09-26 NOTE — Patient Instructions (Signed)
Health Maintenance, Female Adopting a healthy lifestyle and getting preventive care can go a long way to promote health and wellness. Talk with your health care provider about what schedule of regular examinations is right for you. This is a good chance for you to check in with your provider about disease prevention and staying healthy. In between checkups, there are plenty of things you can do on your own. Experts have done a lot of research about which lifestyle changes and preventive measures are most likely to keep you healthy. Ask your health care provider for more information. WEIGHT AND DIET  Eat a healthy diet  Be sure to include plenty of vegetables, fruits, low-fat dairy products, and lean protein.  Do not eat a lot of foods high in solid fats, added sugars, or salt.  Get regular exercise. This is one of the most important things you can do for your health.  Most adults should exercise for at least 150 minutes each week. The exercise should increase your heart rate and make you sweat (moderate-intensity exercise).  Most adults should also do strengthening exercises at least twice a week. This is in addition to the moderate-intensity exercise.  Maintain a healthy weight  Body mass index (BMI) is a measurement that can be used to identify possible weight problems. It estimates body fat based on height and weight. Your health care provider can help determine your BMI and help you achieve or maintain a healthy weight.  For females 20 years of age and older:   A BMI below 18.5 is considered underweight.  A BMI of 18.5 to 24.9 is normal.  A BMI of 25 to 29.9 is considered overweight.  A BMI of 30 and above is considered obese.  Watch levels of cholesterol and blood lipids  You should start having your blood tested for lipids and cholesterol at 45 years of age, then have this test every 5 years.  You may need to have your cholesterol levels checked more often if:  Your lipid  or cholesterol levels are high.  You are older than 45 years of age.  You are at high risk for heart disease.  CANCER SCREENING   Lung Cancer  Lung cancer screening is recommended for adults 55-80 years old who are at high risk for lung cancer because of a history of smoking.  A yearly low-dose CT scan of the lungs is recommended for people who:  Currently smoke.  Have quit within the past 15 years.  Have at least a 30-pack-year history of smoking. A pack year is smoking an average of one pack of cigarettes a day for 1 year.  Yearly screening should continue until it has been 15 years since you quit.  Yearly screening should stop if you develop a health problem that would prevent you from having lung cancer treatment.  Breast Cancer  Practice breast self-awareness. This means understanding how your breasts normally appear and feel.  It also means doing regular breast self-exams. Let your health care provider know about any changes, no matter how small.  If you are in your 20s or 30s, you should have a clinical breast exam (CBE) by a health care provider every 1-3 years as part of a regular health exam.  If you are 40 or older, have a CBE every year. Also consider having a breast X-ray (mammogram) every year.  If you have a family history of breast cancer, talk to your health care provider about genetic screening.  If you   are at high risk for breast cancer, talk to your health care provider about having an MRI and a mammogram every year.  Breast cancer gene (BRCA) assessment is recommended for women who have family members with BRCA-related cancers. BRCA-related cancers include:  Breast.  Ovarian.  Tubal.  Peritoneal cancers.  Results of the assessment will determine the need for genetic counseling and BRCA1 and BRCA2 testing. Cervical Cancer Your health care provider may recommend that you be screened regularly for cancer of the pelvic organs (ovaries, uterus, and  vagina). This screening involves a pelvic examination, including checking for microscopic changes to the surface of your cervix (Pap test). You may be encouraged to have this screening done every 3 years, beginning at age 21.  For women ages 30-65, health care providers may recommend pelvic exams and Pap testing every 3 years, or they may recommend the Pap and pelvic exam, combined with testing for human papilloma virus (HPV), every 5 years. Some types of HPV increase your risk of cervical cancer. Testing for HPV may also be done on women of any age with unclear Pap test results.  Other health care providers may not recommend any screening for nonpregnant women who are considered low risk for pelvic cancer and who do not have symptoms. Ask your health care provider if a screening pelvic exam is right for you.  If you have had past treatment for cervical cancer or a condition that could lead to cancer, you need Pap tests and screening for cancer for at least 20 years after your treatment. If Pap tests have been discontinued, your risk factors (such as having a new sexual partner) need to be reassessed to determine if screening should resume. Some women have medical problems that increase the chance of getting cervical cancer. In these cases, your health care provider may recommend more frequent screening and Pap tests. Colorectal Cancer  This type of cancer can be detected and often prevented.  Routine colorectal cancer screening usually begins at 45 years of age and continues through 45 years of age.  Your health care provider may recommend screening at an earlier age if you have risk factors for colon cancer.  Your health care provider may also recommend using home test kits to check for hidden blood in the stool.  A small camera at the end of a tube can be used to examine your colon directly (sigmoidoscopy or colonoscopy). This is done to check for the earliest forms of colorectal  cancer.  Routine screening usually begins at age 50.  Direct examination of the colon should be repeated every 5-10 years through 45 years of age. However, you may need to be screened more often if early forms of precancerous polyps or small growths are found. Skin Cancer  Check your skin from head to toe regularly.  Tell your health care provider about any new moles or changes in moles, especially if there is a change in a mole's shape or color.  Also tell your health care provider if you have a mole that is larger than the size of a pencil eraser.  Always use sunscreen. Apply sunscreen liberally and repeatedly throughout the day.  Protect yourself by wearing long sleeves, pants, a wide-brimmed hat, and sunglasses whenever you are outside. HEART DISEASE, DIABETES, AND HIGH BLOOD PRESSURE   High blood pressure causes heart disease and increases the risk of stroke. High blood pressure is more likely to develop in:  People who have blood pressure in the high end   of the normal range (130-139/85-89 mm Hg).  People who are overweight or obese.  People who are African American.  If you are 38-23 years of age, have your blood pressure checked every 3-5 years. If you are 61 years of age or older, have your blood pressure checked every year. You should have your blood pressure measured twice--once when you are at a hospital or clinic, and once when you are not at a hospital or clinic. Record the average of the two measurements. To check your blood pressure when you are not at a hospital or clinic, you can use:  An automated blood pressure machine at a pharmacy.  A home blood pressure monitor.  If you are between 45 years and 39 years old, ask your health care provider if you should take aspirin to prevent strokes.  Have regular diabetes screenings. This involves taking a blood sample to check your fasting blood sugar level.  If you are at a normal weight and have a low risk for diabetes,  have this test once every three years after 45 years of age.  If you are overweight and have a high risk for diabetes, consider being tested at a younger age or more often. PREVENTING INFECTION  Hepatitis B  If you have a higher risk for hepatitis B, you should be screened for this virus. You are considered at high risk for hepatitis B if:  You were born in a country where hepatitis B is common. Ask your health care provider which countries are considered high risk.  Your parents were born in a high-risk country, and you have not been immunized against hepatitis B (hepatitis B vaccine).  You have HIV or AIDS.  You use needles to inject street drugs.  You live with someone who has hepatitis B.  You have had sex with someone who has hepatitis B.  You get hemodialysis treatment.  You take certain medicines for conditions, including cancer, organ transplantation, and autoimmune conditions. Hepatitis C  Blood testing is recommended for:  Everyone born from [birth year] through 1965.  Anyone with known risk factors for hepatitis C. Sexually transmitted infections (STIs)  You should be screened for sexually transmitted infections (STIs) including gonorrhea and chlamydia if:  You are sexually active and are younger than 45 years of age.  You are older than 45 years of age and your health care provider tells you that you are at risk for this type of infection.  Your sexual activity has changed since you were last screened and you are at an increased risk for chlamydia or gonorrhea. Ask your health care provider if you are at risk.  If you do not have HIV, but are at risk, it may be recommended that you take a prescription medicine daily to prevent HIV infection. This is called pre-exposure prophylaxis (PrEP). You are considered at risk if:  You are sexually active and do not regularly use condoms or know the HIV status of your partner(s).  You take drugs by injection.  You are sexually  active with a partner who has HIV. Talk with your health care provider about whether you are at high risk of being infected with HIV. If you choose to begin PrEP, you should first be tested for HIV. You should then be tested every 3 months for as long as you are taking PrEP.  PREGNANCY   If you are premenopausal and you may become pregnant, ask your health care provider about preconception counseling.  If you may  become pregnant, take 400 to 800 micrograms (mcg) of folic acid every day.  If you want to prevent pregnancy, talk to your health care provider about birth control (contraception). OSTEOPOROSIS AND MENOPAUSE   Osteoporosis is a disease in which the bones lose minerals and strength with aging. This can result in serious bone fractures. Your risk for osteoporosis can be identified using a bone density scan.  If you are 61 years of age or older, or if you are at risk for osteoporosis and fractures, ask your health care provider if you should be screened.  Ask your health care provider whether you should take a calcium or vitamin D supplement to lower your risk for osteoporosis.  Menopause may have certain physical symptoms and risks.  Hormone replacement therapy may reduce some of these symptoms and risks. Talk to your health care provider about whether hormone replacement therapy is right for you.  HOME CARE INSTRUCTIONS   Schedule regular health, dental, and eye exams.  Stay current with your immunizations.   Do not use any tobacco products including cigarettes, chewing tobacco, or electronic cigarettes.  If you are pregnant, do not drink alcohol.  If you are breastfeeding, limit how much and how often you drink alcohol.  Limit alcohol intake to no more than 1 drink per day for nonpregnant women. One drink equals 12 ounces of beer, 5 ounces of wine, or 1 ounces of hard liquor.  Do not use street drugs.  Do not share needles.  Ask your health care provider for help if  you need support or information about quitting drugs.  Tell your health care provider if you often feel depressed.  Tell your health care provider if you have ever been abused or do not feel safe at home.   This information is not intended to replace advice given to you by your health care provider. Make sure you discuss any questions you have with your health care provider.   Document Released: 05/22/2011 Document Revised: 11/27/2014 Document Reviewed: 10/08/2013 Elsevier Interactive Patient Education [employer].

## 2016-09-28 ENCOUNTER — Ambulatory Visit
Admission: RE | Admit: 2016-09-28 | Discharge: 2016-09-28 | Disposition: A | Discharge status: Home / Self Care | Payer: BC Managed Care – PPO | Source: Ambulatory Visit | Attending: Obstetrics and Gynecology | Admitting: Obstetrics and Gynecology

## 2016-09-28 DIAGNOSIS — Z1231 Encounter for screening mammogram for malignant neoplasm of breast: Secondary | ICD-10-CM

## 2016-09-29 LAB — PAP IG AND HPV HIGH-RISK
HPV, HIGH-RISK: NEGATIVE
PAP SMEAR COMMENT: 0

## 2016-11-22 ENCOUNTER — Ambulatory Visit (INDEPENDENT_AMBULATORY_CARE_PROVIDER_SITE_OTHER): Payer: BC Managed Care – PPO | Admitting: Internal Medicine

## 2016-11-22 ENCOUNTER — Encounter: Payer: Self-pay | Admitting: Internal Medicine

## 2016-11-22 VITALS — BP 120/88 | HR 80 | Temp 98.2°F | Resp 16 | Ht 66.0 in | Wt 259.0 lb

## 2016-11-22 DIAGNOSIS — R5383 Other fatigue: Secondary | ICD-10-CM | POA: Diagnosis not present

## 2016-11-22 DIAGNOSIS — E538 Deficiency of other specified B group vitamins: Secondary | ICD-10-CM | POA: Diagnosis not present

## 2016-11-22 DIAGNOSIS — I10 Essential (primary) hypertension: Secondary | ICD-10-CM | POA: Diagnosis not present

## 2016-11-22 DIAGNOSIS — Z6841 Body Mass Index (BMI) 40.0 and over, adult: Secondary | ICD-10-CM

## 2016-11-22 DIAGNOSIS — Z975 Presence of (intrauterine) contraceptive device: Secondary | ICD-10-CM

## 2016-11-22 DIAGNOSIS — IMO0001 Reserved for inherently not codable concepts without codable children: Secondary | ICD-10-CM

## 2016-11-22 DIAGNOSIS — R87612 Low grade squamous intraepithelial lesion on cytologic smear of cervix (LGSIL): Secondary | ICD-10-CM

## 2016-11-22 DIAGNOSIS — E6609 Other obesity due to excess calories: Secondary | ICD-10-CM

## 2016-11-22 DIAGNOSIS — Z23 Encounter for immunization: Secondary | ICD-10-CM

## 2016-11-22 LAB — CBC WITH DIFFERENTIAL/PLATELET
BASOS ABS: 0 10*3/uL (ref 0.0–0.1)
Basophils Relative: 0.5 % (ref 0.0–3.0)
EOS ABS: 0.1 10*3/uL (ref 0.0–0.7)
Eosinophils Relative: 0.9 % (ref 0.0–5.0)
HCT: 45 % (ref 36.0–46.0)
Hemoglobin: 15.3 g/dL — ABNORMAL HIGH (ref 12.0–15.0)
LYMPHS ABS: 2.3 10*3/uL (ref 0.7–4.0)
Lymphocytes Relative: 27.4 % (ref 12.0–46.0)
MCHC: 34 g/dL (ref 30.0–36.0)
MCV: 90.8 fl (ref 78.0–100.0)
Monocytes Absolute: 0.5 10*3/uL (ref 0.1–1.0)
Monocytes Relative: 5.3 % (ref 3.0–12.0)
NEUTROS ABS: 5.6 10*3/uL (ref 1.4–7.7)
NEUTROS PCT: 65.9 % (ref 43.0–77.0)
PLATELETS: 271 10*3/uL (ref 150.0–400.0)
RBC: 4.95 Mil/uL (ref 3.87–5.11)
RDW: 13.3 % (ref 11.5–15.5)
WBC: 8.5 10*3/uL (ref 4.0–10.5)

## 2016-11-22 LAB — COMPREHENSIVE METABOLIC PANEL
ALT: 30 U/L (ref 0–35)
AST: 19 U/L (ref 0–37)
Albumin: 4 g/dL (ref 3.5–5.2)
Alkaline Phosphatase: 50 U/L (ref 39–117)
BILIRUBIN TOTAL: 0.9 mg/dL (ref 0.2–1.2)
BUN: 11 mg/dL (ref 6–23)
CO2: 30 mEq/L (ref 19–32)
CREATININE: 0.84 mg/dL (ref 0.40–1.20)
Calcium: 9 mg/dL (ref 8.4–10.5)
Chloride: 104 mEq/L (ref 96–112)
GFR: 77.62 mL/min (ref >60.00)
Glucose, Bld: 104 mg/dL — ABNORMAL HIGH (ref 70–99)
Potassium: 4.6 mEq/L (ref 3.5–5.1)
SODIUM: 140 meq/L (ref 135–145)
TOTAL PROTEIN: 6.6 g/dL (ref 6.0–8.3)

## 2016-11-22 LAB — MICROALBUMIN / CREATININE URINE RATIO
CREATININE, U: 101.1 mg/dL
MICROALB/CREAT RATIO: 0.9 mg/g (ref 0.0–30.0)
Microalb, Ur: 0.9 mg/dL (ref 0.0–1.9)

## 2016-11-22 LAB — FERRITIN: Ferritin: 101.9 ng/mL (ref 10.0–291.0)

## 2016-11-22 LAB — LIPID PANEL
CHOL/HDL RATIO: 3
Cholesterol: 162 mg/dL (ref 0–200)
HDL: 57 mg/dL (ref >39.00)
LDL CALC: 77 mg/dL (ref 0–99)
NONHDL: 104.68
Triglycerides: 139 mg/dL (ref 0.0–149.0)
VLDL: 27.8 mg/dL (ref 0.0–40.0)

## 2016-11-22 LAB — TSH: TSH: 1.74 u[IU]/mL (ref 0.35–4.50)

## 2016-11-22 LAB — LDL CHOLESTEROL, DIRECT: Direct LDL: 59 mg/dL

## 2016-11-22 LAB — VITAMIN B12: Vitamin B-12: 271 pg/mL (ref 211–911)

## 2016-11-22 LAB — HEMOGLOBIN A1C: HEMOGLOBIN A1C: 5.6 % (ref 4.6–6.5)

## 2016-11-22 MED ORDER — LIRAGLUTIDE -WEIGHT MANAGEMENT 18 MG/3ML ~~LOC~~ SOPN: 0.6000 mg | PEN_INJECTOR | Freq: Every day | SUBCUTANEOUS | 0 refills | Status: DC

## 2016-11-22 NOTE — Progress Notes (Signed)
Pre-visit discussion using our clinic review tool. No additional management support is needed unless otherwise documented below in the visit note.  

## 2016-11-22 NOTE — Patient Instructions (Signed)
I am recommending use of the medication called Saxenda to help you lose weight.  It is similar to a a medicine that is used to treat diabetes called Victoza,  So It may lower your blood sugars .  IT CAN BE USED LONG TERM (UNLIKE PHENTERMINE) TO MANAGE WEIGHT   It is injected daily in incrementally increasing doses (if tolerated,  Nausea usually resolves in a few days)"  0.6 mg daily   Week 1 1.2 mg daily Week 2 1.8 mg  Daly Week 3 2.4 mg daily Week 4 3.0 mg daily Week 5 and ongoing   If you want  A lesson on how to give yourself the dose,  We wil make you an  RN visit once you pick up your medication from your pharmacy .

## 2016-11-22 NOTE — Progress Notes (Signed)
Subjective:  Patient ID: Laura [name], female    DOB: 1971-01-18  Age: 46 y.o. MRN: [id number]  CC: The primary encounter diagnosis was Fatigue, unspecified type. Diagnoses of Encounter for immunization, Essential hypertension, Class 3 obesity due to excess calories without serious comorbidity with body mass index (BMI) of 40.0 to 44.9 in adult [facility]), B12 deficiency, IUD contraception, and Low grade squamous intraepithelial lesion (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 smear were also pertinent to this visit.  HPI Laura [name] presents for follow  up on hypertension and obesity. .  Patient was last seen June 2016 .    Obesity: she was successful in losing weight with appetite suppressant  Phentermine.  Nadir was  218 in June 2016.  However once the medication was stopped, she gained all of  the weight back. Discussed current diet and exercise regimen.,   Preventive well woman exam done by Gyn in November . Has IUD. Mammogram was normal. LGSIL was noted on PAP by GYN    Patient is taking her medications as prescribed and notes no adverse effects.  Home BP readings have been done about once per week and are  generally < 130/80 .  She is avoiding added salt in her diet and walking regularly about 3 times per week for exercise  .     Outpatient Medications Prior to Visit  Medication Sig Dispense Refill  . ALPRAZolam (XANAX) 0.5 MG tablet Take 1 tablet (0.5 mg total) by mouth at bedtime as needed for anxiety. 30 tablet 3  . EPINEPHrine 0.3 mg/0.3 mL IJ SOAJ injection Inject 0.3 mLs (0.3 mg total) into the muscle once. 1 Device 0  . hydrochlorothiazide (HYDRODIURIL) 25 MG tablet Take 1 tablet (25 mg total) by mouth daily. 90 tablet 3  . levonorgestrel (MIRENA) 20 MCG/24HR IUD 1 each by Intrauterine route once.     No facility-administered medications prior to visit.     Review of Systems;  Patient denies headache, fevers, malaise, unintentional weight loss, skin rash, eye pain, sinus congestion and  sinus pain, sore throat, dysphagia,  hemoptysis , cough, dyspnea, wheezing, chest pain, palpitations, orthopnea, edema, abdominal pain, nausea, melena, diarrhea, constipation, flank pain, dysuria, hematuria, urinary  Frequency, nocturia, numbness, tingling, seizures,  Focal weakness, Loss of consciousness,  Tremor, insomnia, depression, anxiety, and suicidal ideation.      Objective:  BP 120/88   Pulse 80   Temp 98.2 F (36.8 C) (Oral)   Resp 16   Ht 5\' 6"  (1.676 m)   Wt 259 lb (117.5 kg)   SpO2 97%   BMI 41.80 kg/m   BP Readings from Last 3 Encounters:  11/22/16 120/88  09/26/16 (!) 142/84  08/03/15 116/82    Wt Readings from Last 3 Encounters:  11/22/16 259 lb (117.5 kg)  09/26/16 251 lb 11.2 oz (114.2 kg)  08/03/15 220 lb 14.4 oz (100.2 kg)    General appearance: alert, cooperative and appears stated age Ears: normal TM's and external ear canals both ears Throat: lips, mucosa, and tongue normal; teeth and gums normal Neck: no adenopathy, no carotid bruit, supple, symmetrical, trachea midline and thyroid not enlarged, symmetric, no tenderness/mass/nodules Back: symmetric, no curvature. ROM normal. No CVA tenderness. Lungs: clear to auscultation bilaterally Heart: regular rate and rhythm, S1, S2 normal, no murmur, click, rub or gallop Abdomen: soft, non-tender; bowel sounds normal; no masses,  no organomegaly Pulses: 2+ and symmetric Skin: Skin color, texture, turgor normal. No rashes or lesions Lymph nodes: Cervical, supraclavicular, and  axillary nodes normal.  Lab Results  Component Value Date   HGBA1C 5.6 11/22/2016    Lab Results  Component Value Date   CREATININE 0.84 11/22/2016   CREATININE 0.94 05/05/2015   CREATININE 1.0 09/09/2014    Lab Results  Component Value Date   WBC 8.5 11/22/2016   HGB 15.3 (H) 11/22/2016   HCT 45.0 11/22/2016   PLT 271.0 11/22/2016   GLUCOSE 104 (H) 11/22/2016   CHOL 162 11/22/2016   TRIG 139.0 11/22/2016   HDL 57.00  11/22/2016   LDLDIRECT 59.0 11/22/2016   LDLCALC 77 11/22/2016   ALT 30 11/22/2016   AST 19 11/22/2016   NA 140 11/22/2016   K 4.6 11/22/2016   CL 104 11/22/2016   CREATININE 0.84 11/22/2016   BUN 11 11/22/2016   CO2 30 11/22/2016   TSH 1.74 11/22/2016   HGBA1C 5.6 11/22/2016   MICROALBUR 0.9 11/22/2016    Mm Screening Breast Tomo Bilateral  Result Date: 09/28/2016 CLINICAL DATA:  Screening. EXAM: 2D DIGITAL SCREENING BILATERAL MAMMOGRAM WITH CAD AND ADJUNCT TOMO COMPARISON:  Previous exam(s). ACR Breast Density Category b: There are scattered areas of fibroglandular density. FINDINGS: There are no findings suspicious for malignancy. Images were processed with CAD. IMPRESSION: No mammographic evidence of malignancy. A result letter of this screening mammogram will be mailed directly to the patient. RECOMMENDATION: Screening mammogram in one year. (Code:SM-B-01Y) BI-RADS CATEGORY  1: Negative. Electronically Signed   By: Pamelia Hoit M.D.   On: 09/28/2016 18:15    Assessment & Plan:   Problem List Items Addressed This Visit    B12 deficiency    Suggested by low normal level and concurrent fatigue.  b12 injections prescribed for self administration : 1 ml weekly x 3, then monthly.       Essential hypertension    .Elevated today on hct alone.  Reviewed list of meds, patient is not taking OTC meds that could be causing,. It.  Have asked patient to recheck bp at home a minimum of 5 times over the next 4 weeks and call readings to office for adjustment of medications.        Relevant Orders   LDL cholesterol, direct (Completed)   Lipid panel (Completed)   Microalbumin / creatinine urine ratio (Completed)   Fatigue - Primary    She was screened for thyroid b12 deficiency and anemia.  Does not snore .   Lab Results  Component Value Date   WBC 8.5 11/22/2016   HGB 15.3 (H) 11/22/2016   HCT 45.0 11/22/2016   MCV 90.8 11/22/2016   PLT 271.0 11/22/2016   Lab Results  Component  Value Date   [id number] 11/22/2016   Lab Results  Component Value Date   TSH 1.74 11/22/2016         Relevant Orders   TSH (Completed)   CBC with Differential/Platelet (Completed)   IBC panel (Completed)   Vitamin B12 (Completed)   Ferritin (Completed)   IUD contraception   Low grade squamous intraepithelial lesion (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 smear    On PAP done by [facility] Nov 2017.        Obesity    30 lb weight gain noted since stopping phentermine.  I have addressed  BMI and recommended wt loss of 10% of body weight over the next 6 months using a low fat, low starch, high protein  fruit/vegetable based Mediterranean diet and 30 minutes of aerobic exercise a minimum of 5 days per week.  Pharmacotherapy with saxenda advised and accepted.       Relevant Medications   Liraglutide -Weight Management (SAXENDA) 18 MG/3ML SOPN   Other Relevant Orders   Comprehensive metabolic panel (Completed)   Hemoglobin A1c (Completed)    Other Visit Diagnoses    Encounter for immunization       Relevant Orders   Flu Vaccine QUAD 36+ mos IM (Completed)    A total of 40 minutes was spent with patient more than half of which was spent in counseling patient on the above mentioned issues , reviewing and explaining recent labs and imaging studies done, and coordination of care.  I am having Ms. Shen start on Liraglutide -Weight Management. I am also having her maintain her hydrochlorothiazide, ALPRAZolam, levonorgestrel, and EPINEPHrine.  Meds ordered this encounter  Medications  . Liraglutide -Weight Management (SAXENDA) 18 MG/3ML SOPN    Sig: Inject 0.6 mg into the skin daily. Increase dose weekly as follows: Week 2: 1.2 mg daily ; Week 3: 1.8 mg daily; Week 4: 2.4 mg daily    Dispense:  9 mL    Refill:  0    There are no discontinued medications.  Follow-up: Return in about 3 months (around 02/20/2017) for weight managment, hypertension .   Crecencio Mc, MD

## 2016-11-23 ENCOUNTER — Encounter: Payer: Self-pay | Admitting: Internal Medicine

## 2016-11-23 DIAGNOSIS — E538 Deficiency of other specified B group vitamins: Secondary | ICD-10-CM | POA: Insufficient documentation

## 2016-11-23 LAB — IBC PANEL
Iron: 104 ug/dL (ref 42–145)
SATURATION RATIOS: 29.8 % (ref 20.0–50.0)
Transferrin: 249 mg/dL (ref 212.0–360.0)

## 2016-11-23 NOTE — Assessment & Plan Note (Signed)
.  Elevated today on hct alone.  Reviewed list of meds, patient is not taking OTC meds that could be causing,. It.  Have asked patient to recheck bp at home a minimum of 5 times over the next 4 weeks and call readings to office for adjustment of medications.

## 2016-11-23 NOTE — Assessment & Plan Note (Addendum)
30 lb weight gain noted since stopping phentermine.  I have addressed  BMI and recommended wt loss of 10% of body weight over the next 6 months using a low fat, low starch, high protein  fruit/vegetable based Mediterranean diet and 30 minutes of aerobic exercise a minimum of 5 days per week.  Pharmacotherapy with saxenda advised and accepted.

## 2016-11-23 NOTE — Assessment & Plan Note (Addendum)
Suggested by low normal level and concurrent fatigue.  b12 injections prescribed for self administration : 1 ml weekly x 3, then monthly.

## 2016-11-24 MED ORDER — "SYRINGE/NEEDLE (DISP) 25G X 1"" 3 ML MISC": 0 refills | Status: DC

## 2016-11-24 MED ORDER — CYANOCOBALAMIN 1000 MCG/ML IJ SOLN: 1000.0000 ug | INTRAMUSCULAR | 1 refills | Status: DC

## 2016-11-25 DIAGNOSIS — R87612 Low grade squamous intraepithelial lesion on cytologic smear of cervix (LGSIL): Secondary | ICD-10-CM | POA: Insufficient documentation

## 2016-11-25 DIAGNOSIS — Z975 Presence of (intrauterine) contraceptive device: Secondary | ICD-10-CM | POA: Insufficient documentation

## 2016-11-25 DIAGNOSIS — R5383 Other fatigue: Secondary | ICD-10-CM | POA: Insufficient documentation

## 2016-11-25 NOTE — Assessment & Plan Note (Signed)
She was screened for thyroid b12 deficiency and anemia.  Does not snore .   Lab Results  Component Value Date   WBC 8.5 11/22/2016   HGB 15.3 (H) 11/22/2016   HCT 45.0 11/22/2016   MCV 90.8 11/22/2016   PLT 271.0 11/22/2016   Lab Results  Component Value Date   VITAMINB12 271 11/22/2016   Lab Results  Component Value Date   TSH 1.74 11/22/2016

## 2016-11-25 NOTE — Assessment & Plan Note (Signed)
On PAP done by [facility] Nov 2017.

## 2017-02-21 ENCOUNTER — Encounter: Payer: Self-pay | Admitting: Internal Medicine

## 2017-02-21 ENCOUNTER — Ambulatory Visit (INDEPENDENT_AMBULATORY_CARE_PROVIDER_SITE_OTHER): Payer: BC Managed Care – PPO | Admitting: Internal Medicine

## 2017-02-21 VITALS — BP 120/78 | HR 88 | Temp 98.2°F | Resp 16 | Ht 66.0 in | Wt 243.6 lb

## 2017-02-21 DIAGNOSIS — H6501 Acute serous otitis media, right ear: Secondary | ICD-10-CM

## 2017-02-21 DIAGNOSIS — I10 Essential (primary) hypertension: Secondary | ICD-10-CM | POA: Diagnosis not present

## 2017-02-21 DIAGNOSIS — E6609 Other obesity due to excess calories: Secondary | ICD-10-CM

## 2017-02-21 DIAGNOSIS — Z79899 Other long term (current) drug therapy: Secondary | ICD-10-CM

## 2017-02-21 LAB — COMPREHENSIVE METABOLIC PANEL
ALBUMIN: 4 g/dL (ref 3.5–5.2)
ALK PHOS: 46 U/L (ref 39–117)
ALT: 33 U/L (ref 0–35)
AST: 24 U/L (ref 0–37)
BILIRUBIN TOTAL: 0.8 mg/dL (ref 0.2–1.2)
BUN: 7 mg/dL (ref 6–23)
CO2: 30 mEq/L (ref 19–32)
Calcium: 8.9 mg/dL (ref 8.4–10.5)
Chloride: 103 mEq/L (ref 96–112)
Creatinine, Ser: 0.95 mg/dL (ref 0.40–1.20)
GFR: 67.27 mL/min (ref >60.00)
Glucose, Bld: 93 mg/dL (ref 70–99)
POTASSIUM: 3.8 meq/L (ref 3.5–5.1)
SODIUM: 138 meq/L (ref 135–145)
TOTAL PROTEIN: 7.1 g/dL (ref 6.0–8.3)

## 2017-02-21 MED ORDER — HYDROCHLOROTHIAZIDE 25 MG PO TABS: 25.0000 mg | ORAL_TABLET | Freq: Every day | ORAL | 3 refills | Status: DC

## 2017-02-21 MED ORDER — FLUCONAZOLE 150 MG PO TABS: 150.0000 mg | ORAL_TABLET | Freq: Every day | ORAL | 0 refills | Status: DC

## 2017-02-21 MED ORDER — CYANOCOBALAMIN 1000 MCG/ML IJ SOLN: 1000.0000 ug | INTRAMUSCULAR | 1 refills | Status: DC

## 2017-02-21 MED ORDER — LIRAGLUTIDE -WEIGHT MANAGEMENT 18 MG/3ML ~~LOC~~ SOPN: 3.0000 mg | PEN_INJECTOR | Freq: Every day | SUBCUTANEOUS | 5 refills | Status: DC

## 2017-02-21 MED ORDER — MONTELUKAST SODIUM 10 MG PO TABS: 10.0000 mg | ORAL_TABLET | Freq: Every day | ORAL | 3 refills | Status: DC

## 2017-02-21 MED ORDER — AMOXICILLIN-POT CLAVULANATE 875-125 MG PO TABS: 1.0000 | ORAL_TABLET | Freq: Two times a day (BID) | ORAL | 0 refills | Status: DC

## 2017-02-21 MED ORDER — PREDNISONE 10 MG PO TABS: ORAL_TABLET | ORAL | 0 refills | Status: DC

## 2017-02-21 NOTE — Progress Notes (Signed)
Subjective:  Patient ID: Laura [name], female    DOB: [birth year]  Age: 46 y.o. MRN: [id number]  CC: The primary encounter diagnosis was Long-term use of high-risk medication. Diagnoses of Essential hypertension, Class 1 obesity due to excess calories without serious comorbidity in adult, unspecified BMI, and Right acute serous otitis media, recurrence not specified were also pertinent to this visit.  HPI Laura [name] presents for follow up on hypertension, GAD, and obesity   Cc:  She has been experiencing right inner ear pain for the past 2 days accompanied by rhinitis and mild congestion without facial pain or fevers,    Obesity:  .Body mass index is 39.32 kg/m.she was prescribed Saxenda at her previous visit in January .  She is tolerating the medication without side effects except for transient nausea with each dose titration.  She has lost 16 lbs in 3 months     Outpatient Medications Prior to Visit  Medication Sig Dispense Refill  . ALPRAZolam (XANAX) 0.5 MG tablet Take 1 tablet (0.5 mg total) by mouth at bedtime as needed for anxiety. 30 tablet 3  . EPINEPHrine 0.3 mg/0.3 mL IJ SOAJ injection Inject 0.3 mLs (0.3 mg total) into the muscle once. 1 Device 0  . levonorgestrel (MIRENA) 20 MCG/24HR IUD 1 each by Intrauterine route once.    . SYRINGE-NEEDLE, DISP, 3 ML 25G X 1" 3 ML MISC Use for b12 injections 50 each 0  . cyanocobalamin (,VITAMIN B-12,) 1000 MCG/ML injection Inject 1 mL (1,000 mcg total) into the muscle once a week. FOR 3 WEEKS,  THEN MONTHLY 10 mL 1  . hydrochlorothiazide (HYDRODIURIL) 25 MG tablet Take 1 tablet (25 mg total) by mouth daily. 90 tablet 3  . Liraglutide -Weight Management (SAXENDA) 18 MG/3ML SOPN Inject 0.6 mg into the skin daily. Increase dose weekly as follows: Week 2: 1.2 mg daily ; Week 3: 1.8 mg daily; Week 4: 2.4 mg daily 9 mL 0   No facility-administered medications prior to visit.     Review of Systems;  Patient denies headache, fevers,  malaise, unintentional weight loss, skin rash, eye pain,  sinus pain, sore throat, dysphagia,  hemoptysis , cough, dyspnea, wheezing, chest pain, palpitations, orthopnea, edema, abdominal pain, persistent nausea, melena, diarrhea, constipation, flank pain, dysuria, hematuria, urinary  Frequency, nocturia, numbness, tingling, seizures,  Focal weakness, Loss of consciousness,  Tremor, insomnia, depression, anxiety, and suicidal ideation.      Objective:  BP 120/78   Pulse 88   Temp 98.2 F (36.8 C) (Oral)   Resp 16   Ht 5\' 6"  (1.676 m)   Wt 243 lb 9.6 oz (110.5 kg)   SpO2 97%   BMI 39.32 kg/m   BP Readings from Last 3 Encounters:  02/21/17 120/78  11/22/16 120/88  09/26/16 (!) 142/84    Wt Readings from Last 3 Encounters:  02/21/17 243 lb 9.6 oz (110.5 kg)  11/22/16 259 lb (117.5 kg)  09/26/16 251 lb 11.2 oz (114.2 kg)    General appearance: alert, cooperative and appears stated age 15: normal except for  RIGHT  TM injected and erythematous , normal lft rmal TMs and external ear canals bilaterally Throat: lips, mucosa, and tongue normal; teeth and gums normal Neck: no adenopathy, no carotid bruit, supple, symmetrical, trachea midline and thyroid not enlarged, symmetric, no tenderness/mass/nodules Back: symmetric, no curvature. ROM normal. No CVA tenderness. Lungs: clear to auscultation bilaterally Heart: regular rate and rhythm, S1, S2 normal, no murmur, click, rub or gallop  Abdomen: soft, non-tender; bowel sounds normal; no masses,  no organomegaly Pulses: 2+ and symmetric Skin: Skin color, texture, turgor normal. No rashes or lesions Lymph nodes: Cervical, supraclavicular, and axillary nodes normal.  Lab Results  Component Value Date   HGBA1C 5.6 11/22/2016    Lab Results  Component Value Date   CREATININE 0.95 02/21/2017   CREATININE 0.84 11/22/2016   CREATININE 0.94 05/05/2015    Lab Results  Component Value Date   WBC 8.5 11/22/2016   HGB 15.3 (H)  11/22/2016   HCT 45.0 11/22/2016   PLT 271.0 11/22/2016   GLUCOSE 93 02/21/2017   CHOL 162 11/22/2016   TRIG 139.0 11/22/2016   HDL 57.00 11/22/2016   LDLDIRECT 59.0 11/22/2016   LDLCALC 77 11/22/2016   ALT 33 02/21/2017   AST 24 02/21/2017   NA 138 02/21/2017   K 3.8 02/21/2017   CL 103 02/21/2017   CREATININE 0.95 02/21/2017   BUN 7 02/21/2017   CO2 30 02/21/2017   TSH 1.74 11/22/2016   HGBA1C 5.6 11/22/2016   MICROALBUR 0.9 11/22/2016    Mm Screening Breast Tomo Bilateral  Result Date: 09/28/2016 CLINICAL DATA:  Screening. EXAM: 2D DIGITAL SCREENING BILATERAL MAMMOGRAM WITH CAD AND ADJUNCT TOMO COMPARISON:  Previous exam(s). ACR Breast Density Category b: There are scattered areas of fibroglandular density. FINDINGS: There are no findings suspicious for malignancy. Images were processed with CAD. IMPRESSION: No mammographic evidence of malignancy. A result letter of this screening mammogram will be mailed directly to the patient. RECOMMENDATION: Screening mammogram in one year. (Code:SM-B-01Y) BI-RADS CATEGORY  1: Negative. Electronically Signed   By: Pamelia Hoit M.D.   On: 09/28/2016 18:15    Assessment & Plan:   Problem List Items Addressed This Visit    Essential hypertension    Well controlled on current regimen. Renal function stable, no changes today.  Lab Results  Component Value Date   CREATININE 0.95 02/21/2017   Lab Results  Component Value Date   NA 138 02/21/2017   K 3.8 02/21/2017   CL 103 02/21/2017   CO2 30 02/21/2017         Relevant Medications   hydrochlorothiazide (HYDRODIURIL) 25 MG tablet   Long-term use of high-risk medication - Primary    hepatic function panel is normal  Since starting saxenda  Lab Results  Component Value Date   ALT 33 02/21/2017   AST 24 02/21/2017   ALKPHOS 46 02/21/2017   BILITOT 0.8 02/21/2017         Relevant Orders   Comprehensive metabolic panel (Completed)   Obesity    I have noted reduction of   BMI  with use of Saxenda (net loss of 16 lbs in 3 months) and encouraged  Continued weight loss with goal of 10% of body weight over the next 6 months using a low glycemic index diet and regular exercise a minimum of 5 days per week.        Relevant Medications   Liraglutide -Weight Management (SAXENDA) 18 MG/3ML SOPN   Otitis media of right ear    Empiric abx and prednisone taper  prescribed.   Probiotic advised       Relevant Medications   amoxicillin-clavulanate (AUGMENTIN) 875-125 MG tablet   fluconazole (DIFLUCAN) 150 MG tablet      I have changed Ms. Enriques's cyanocobalamin and Liraglutide -Weight Management. I am also having her start on montelukast, amoxicillin-clavulanate, predniSONE, and fluconazole. Additionally, I am having her maintain her ALPRAZolam, levonorgestrel, EPINEPHrine,  SYRINGE-NEEDLE (DISP) 3 ML, and hydrochlorothiazide.  Meds ordered this encounter  Medications  . hydrochlorothiazide (HYDRODIURIL) 25 MG tablet    Sig: Take 1 tablet (25 mg total) by mouth daily.    Dispense:  90 tablet    Refill:  3  . montelukast (SINGULAIR) 10 MG tablet    Sig: Take 1 tablet (10 mg total) by mouth at bedtime.    Dispense:  30 tablet    Refill:  3  . cyanocobalamin (,VITAMIN B-12,) 1000 MCG/ML injection    Sig: Inject 1 mL (1,000 mcg total) into the muscle every 30 (thirty) days.    Dispense:  10 mL    Refill:  1    KEEP ON FILE FOR FUTURE REFILLS  . Liraglutide -Weight Management (SAXENDA) 18 MG/3ML SOPN    Sig: Inject 3 mg into the skin daily.    Dispense:  9 mL    Refill:  5  . amoxicillin-clavulanate (AUGMENTIN) 875-125 MG tablet    Sig: Take 1 tablet by mouth 2 (two) times daily.    Dispense:  14 tablet    Refill:  0  . predniSONE (DELTASONE) 10 MG tablet    Sig: 6 tablets on Day 1 , then reduce by 1 tablet daily until gone    Dispense:  21 tablet    Refill:  0  . fluconazole (DIFLUCAN) 150 MG tablet    Sig: Take 1 tablet (150 mg total) by mouth daily.     Dispense:  2 tablet    Refill:  0   A total of 25 minutes of face to face time was spent with patient more than half of which was spent in counselling about the above mentioned conditions  and coordination of care  Medications Discontinued During This Encounter  Medication Reason  . hydrochlorothiazide (HYDRODIURIL) 25 MG tablet Reorder  . cyanocobalamin (,VITAMIN B-12,) 1000 MCG/ML injection Reorder  . Liraglutide -Weight Management (SAXENDA) 18 MG/3ML SOPN Reorder    Follow-up: No Follow-up on file.   Crecencio Mc, MD

## 2017-02-21 NOTE — Progress Notes (Signed)
Pre visit review using our clinic review tool, if applicable. No additional management support is needed unless otherwise documented below in the visit note. 

## 2017-02-21 NOTE — Patient Instructions (Signed)
I am treating you for sinusitis/otitis which is a complication from your   persistent sinus congestion.   I am prescribing an antibiotic (augmentin) and a prednisone taper  To manage the infection and the inflammation in your ear/sinuses.   I also advise use of the following OTC meds to help with your other symptoms.   Take generic OTC benadryl 25 mg every 8 hours for the drainage,  Sudafed PE  10 to 30 mg every 8 hours for the congestion, you may substitute Afrin nasal spray for the nighttime dose of sudafed PE  If needed to prevent insomnia.  flush your sinuses once or  twice daily with eil Med Sinus rinse  (do over the sink because if you do it right you will spit out globs of mucus)  Taking an antibiotic can create an imbalance in the normal population of bacteria that live in the small intestine.  This imbalance can persist for 3 months.   Taking a probiotic ( Align, Floraque or Culturelle), the generic version of one of these over the counter medications, or an alternative form (kombucha,  Yogurt, or another dietary source) for a minimum of 3 weeks may help prevent a serious antibiotic associated diarrhea  Called clostridium dificile colitis that occurs when the bacteria population is altered .  Taking a probiotic may also prevent vaginitis due to yeast infections and can be continued indefinitely if you feel that it improves your digestion or your elimination (bowels).

## 2017-02-22 ENCOUNTER — Encounter: Payer: Self-pay | Admitting: Internal Medicine

## 2017-02-24 DIAGNOSIS — H6691 Otitis media, unspecified, right ear: Secondary | ICD-10-CM | POA: Insufficient documentation

## 2017-02-24 NOTE — Assessment & Plan Note (Signed)
Well controlled on current regimen. Renal function stable, no changes today.  Lab Results  Component Value Date   CREATININE 0.95 02/21/2017   Lab Results  Component Value Date   NA 138 02/21/2017   K 3.8 02/21/2017   CL 103 02/21/2017   CO2 30 02/21/2017

## 2017-02-24 NOTE — Assessment & Plan Note (Signed)
Empiric abx and prednisone taper  prescribed.   Probiotic advised

## 2017-02-24 NOTE — Assessment & Plan Note (Signed)
I have noted reduction of   BMI with use of Saxenda (net loss of 16 lbs in 3 months) and encouraged  Continued weight loss with goal of 10% of body weight over the next 6 months using a low glycemic index diet and regular exercise a minimum of 5 days per week.

## 2017-02-24 NOTE — Assessment & Plan Note (Signed)
hepatic function panel is normal  Since starting saxenda  Lab Results  Component Value Date   ALT 33 02/21/2017   AST 24 02/21/2017   ALKPHOS 46 02/21/2017   BILITOT 0.8 02/21/2017

## 2017-04-23 ENCOUNTER — Telehealth: Payer: Self-pay

## 2017-04-23 NOTE — Telephone Encounter (Signed)
PA for Saxenda completed on cover my meds

## 2017-04-24 NOTE — Telephone Encounter (Signed)
Received a letter from [employer] stating that the PA for Laura [name] has been approved from 04/23/2017 - 04/23/2018. Pt and pharmacy has been notified.

## 2017-05-28 ENCOUNTER — Encounter: Payer: Self-pay | Admitting: Internal Medicine

## 2017-05-28 ENCOUNTER — Ambulatory Visit (INDEPENDENT_AMBULATORY_CARE_PROVIDER_SITE_OTHER): Payer: BC Managed Care – PPO | Admitting: Internal Medicine

## 2017-05-28 VITALS — BP 110/66 | HR 83 | Temp 98.4°F | Resp 15 | Ht 66.0 in | Wt 236.2 lb

## 2017-05-28 DIAGNOSIS — D582 Other hemoglobinopathies: Secondary | ICD-10-CM

## 2017-05-28 DIAGNOSIS — I10 Essential (primary) hypertension: Secondary | ICD-10-CM | POA: Diagnosis not present

## 2017-05-28 DIAGNOSIS — E538 Deficiency of other specified B group vitamins: Secondary | ICD-10-CM

## 2017-05-28 DIAGNOSIS — E669 Obesity, unspecified: Secondary | ICD-10-CM | POA: Diagnosis not present

## 2017-05-28 LAB — CBC WITH DIFFERENTIAL/PLATELET
BASOS PCT: 0.6 % (ref 0.0–3.0)
Basophils Absolute: 0 10*3/uL (ref 0.0–0.1)
EOS ABS: 0.1 10*3/uL (ref 0.0–0.7)
Eosinophils Relative: 1.3 % (ref 0.0–5.0)
HCT: 44.9 % (ref 36.0–46.0)
HEMOGLOBIN: 15.4 g/dL — AB (ref 12.0–15.0)
Lymphocytes Relative: 36.1 % (ref 12.0–46.0)
Lymphs Abs: 2.1 10*3/uL (ref 0.7–4.0)
MCHC: 34.3 g/dL (ref 30.0–36.0)
MCV: 92.1 fl (ref 78.0–100.0)
MONOS PCT: 5.2 % (ref 3.0–12.0)
Monocytes Absolute: 0.3 10*3/uL (ref 0.1–1.0)
Neutro Abs: 3.2 10*3/uL (ref 1.4–7.7)
Neutrophils Relative %: 56.8 % (ref 43.0–77.0)
Platelets: 270 10*3/uL (ref 150.0–400.0)
RBC: 4.88 Mil/uL (ref 3.87–5.11)
RDW: 12.9 % (ref 11.5–15.5)
WBC: 5.7 10*3/uL (ref 4.0–10.5)

## 2017-05-28 MED ORDER — EPINEPHRINE 0.3 MG/0.3ML IJ SOAJ: 0.3000 mg | Freq: Once | INTRAMUSCULAR | 0 refills | Status: AC

## 2017-05-28 MED ORDER — MONTELUKAST SODIUM 10 MG PO TABS: 10.0000 mg | ORAL_TABLET | Freq: Every day | ORAL | 3 refills | Status: DC

## 2017-05-28 MED ORDER — ALPRAZOLAM 0.5 MG PO TABS: 0.5000 mg | ORAL_TABLET | Freq: Every evening | ORAL | 3 refills | Status: DC | PRN

## 2017-05-28 MED ORDER — DOXYCYCLINE HYCLATE 100 MG PO TABS: 100.0000 mg | ORAL_TABLET | Freq: Two times a day (BID) | ORAL | 0 refills | Status: DC

## 2017-05-28 NOTE — Progress Notes (Signed)
Subjective:  Patient ID: Laura [name], female    DOB: Jul 24, 1971  Age: 46 y.o. MRN: [id number]  CC: The primary encounter diagnosis was Elevated hemoglobin ([place]). Diagnoses of B12 deficiency, Essential hypertension, and Obesity (BMI 35.0-39.9 without comorbidity) were also pertinent to this visit.  HPI Laura [name] presents for FOLLOW UP  ON OBESITY MANAGED WITH SAXENDA SINCE January.   HAS LOST 7 LB SINCE April, and a total of 23 lbs SINCE January,   Which is 9% of starting weight.  She reports decreased participation in exercise , citing difficulty in maintaining a schedule due to her work schedule which often shifts between night shifts and day shifts.  Her appetite has increased somewhat as well despite using the saxenda daily.  She has not been drinking enough water daily and notes occasional constipaiton relieved with prn stool softeners.   Outpatient Medications Prior to Visit  Medication Sig Dispense Refill  . cyanocobalamin (,VITAMIN B-12,) 1000 MCG/ML injection Inject 1 mL (1,000 mcg total) into the muscle every 30 (thirty) days. 10 mL 1  . hydrochlorothiazide (HYDRODIURIL) 25 MG tablet Take 1 tablet (25 mg total) by mouth daily. 90 tablet 3  . levonorgestrel (MIRENA) 20 MCG/24HR IUD 1 each by Intrauterine route once.    . Liraglutide -Weight Management (SAXENDA) 18 MG/3ML SOPN Inject 3 mg into the skin daily. 9 mL 5  . SYRINGE-NEEDLE, DISP, 3 ML 25G X 1" 3 ML MISC Use for b12 injections 50 each 0  . ALPRAZolam (XANAX) 0.5 MG tablet Take 1 tablet (0.5 mg total) by mouth at bedtime as needed for anxiety. 30 tablet 3  . EPINEPHrine 0.3 mg/0.3 mL IJ SOAJ injection Inject 0.3 mLs (0.3 mg total) into the muscle once. 1 Device 0  . montelukast (SINGULAIR) 10 MG tablet Take 1 tablet (10 mg total) by mouth at bedtime. 30 tablet 3  . amoxicillin-clavulanate (AUGMENTIN) 875-125 MG tablet Take 1 tablet by mouth 2 (two) times daily. (Patient not taking: Reported on 05/28/2017) 14 tablet 0  .  fluconazole (DIFLUCAN) 150 MG tablet Take 1 tablet (150 mg total) by mouth daily. (Patient not taking: Reported on 05/28/2017) 2 tablet 0  . predniSONE (DELTASONE) 10 MG tablet 6 tablets on Day 1 , then reduce by 1 tablet daily until gone (Patient not taking: Reported on 05/28/2017) 21 tablet 0   No facility-administered medications prior to visit.     Review of Systems;  Patient denies headache, fevers, malaise, unintentional weight loss, skin rash, eye pain, sinus congestion and sinus pain, sore throat, dysphagia,  hemoptysis , cough, dyspnea, wheezing, chest pain, palpitations, orthopnea, edema, abdominal pain, nausea, melena, diarrhea, constipation, flank pain, dysuria, hematuria, urinary  Frequency, nocturia, numbness, tingling, seizures,  Focal weakness, Loss of consciousness,  Tremor, insomnia, depression, anxiety, and suicidal ideation.      Objective:  BP 110/66 (BP Location: Left Arm, Patient Position: Sitting, Cuff Size: Large)   Pulse 83   Temp 98.4 F (36.9 C) (Oral)   Resp 15   Ht 5\' 6"  (1.676 m)   Wt 236 lb 3.2 oz (107.1 kg)   SpO2 98%   BMI 38.12 kg/m   BP Readings from Last 3 Encounters:  05/28/17 110/66  02/21/17 120/78  11/22/16 120/88    Wt Readings from Last 3 Encounters:  05/28/17 236 lb 3.2 oz (107.1 kg)  02/21/17 243 lb 9.6 oz (110.5 kg)  11/22/16 259 lb (117.5 kg)    General appearance: alert, cooperative and appears stated age  Ears: normal TM's and external ear canals both ears Throat: lips, mucosa, and tongue normal; teeth and gums normal Neck: no adenopathy, no carotid bruit, supple, symmetrical, trachea midline and thyroid not enlarged, symmetric, no tenderness/mass/nodules Back: symmetric, no curvature. ROM normal. No CVA tenderness. Lungs: clear to auscultation bilaterally Heart: regular rate and rhythm, S1, S2 normal, no murmur, click, rub or gallop Abdomen: soft, non-tender; bowel sounds normal; no masses,  no organomegaly Pulses: 2+ and  symmetric Skin: Skin color, texture, turgor normal. No rashes or lesions Lymph nodes: Cervical, supraclavicular, and axillary nodes normal.  Lab Results  Component Value Date   HGBA1C 5.6 11/22/2016    Lab Results  Component Value Date   CREATININE 0.95 02/21/2017   CREATININE 0.84 11/22/2016   CREATININE 0.94 05/05/2015    Lab Results  Component Value Date   WBC 5.7 05/28/2017   HGB 15.4 (H) 05/28/2017   HCT 44.9 05/28/2017   PLT 270.0 05/28/2017   GLUCOSE 93 02/21/2017   CHOL 162 11/22/2016   TRIG 139.0 11/22/2016   HDL 57.00 11/22/2016   LDLDIRECT 59.0 11/22/2016   LDLCALC 77 11/22/2016   ALT 33 02/21/2017   AST 24 02/21/2017   NA 138 02/21/2017   K 3.8 02/21/2017   CL 103 02/21/2017   CREATININE 0.95 02/21/2017   BUN 7 02/21/2017   CO2 30 02/21/2017   TSH 1.74 11/22/2016   HGBA1C 5.6 11/22/2016   MICROALBUR 0.9 11/22/2016    Mm Screening Breast Tomo Bilateral  Result Date: 09/28/2016 CLINICAL DATA:  Screening. EXAM: 2D DIGITAL SCREENING BILATERAL MAMMOGRAM WITH CAD AND ADJUNCT TOMO COMPARISON:  Previous exam(s). ACR Breast Density Category b: There are scattered areas of fibroglandular density. FINDINGS: There are no findings suspicious for malignancy. Images were processed with CAD. IMPRESSION: No mammographic evidence of malignancy. A result letter of this screening mammogram will be mailed directly to the patient. RECOMMENDATION: Screening mammogram in one year. (Code:SM-B-01Y) BI-RADS CATEGORY  1: Negative. Electronically Signed   By: Pamelia Hoit M.D.   On: 09/28/2016 18:15    Assessment & Plan:   Problem List Items Addressed This Visit    Obesity (BMI 35.0-39.9 without comorbidity)    I have noted a slowing in the reduction of   BMI with use of Saxenda (net loss of 23 lbs in 6 months) and encouraged  Participation in regular exercise to continue  weight loss with goal of 10% of body weight over the next 6 months using a low glycemic index diet , Saxenda.   Advised to add  a daily bulk forming laxative (Citrucel, Metamucil, Benefiber, Miralax , or Fibercon) 30 minutes prior to her largest meal to increase satiety.        Essential hypertension    Well controlled on current regimen. Renal function stable, no changes today.  Lab Results  Component Value Date   CREATININE 0.95 02/21/2017   Lab Results  Component Value Date   NA 138 02/21/2017   K 3.8 02/21/2017   CL 103 02/21/2017   CO2 30 02/21/2017         B12 deficiency    Suggested by low normal level and concurrent fatigue.  Continue monthly b12 injections via self administration       Elevated hemoglobin ([facility]) - Primary    Mild, stable, with no evidence of HH or PCV.  checking iron studies next visit , encouraged to increase hydration       Relevant Orders   CBC with Differential/Platelet (Completed)  Iron and TIBC   Ferritin   CBC with Differential/Platelet      I have discontinued Laura [name]'s amoxicillin-clavulanate, predniSONE, and fluconazole. I am also having her start on doxycycline. Additionally, I am having her maintain her levonorgestrel, SYRINGE-NEEDLE (DISP) 3 ML, hydrochlorothiazide, cyanocobalamin, Liraglutide -Weight Management, montelukast, and ALPRAZolam.  Meds ordered this encounter  Medications  . EPINEPHrine 0.3 mg/0.3 mL IJ SOAJ injection    Sig: Inject 0.3 mLs (0.3 mg total) into the muscle once.    Dispense:  1 Device    Refill:  0  . montelukast (SINGULAIR) 10 MG tablet    Sig: Take 1 tablet (10 mg total) by mouth at bedtime.    Dispense:  30 tablet    Refill:  3    keep on file for future refills  . ALPRAZolam (XANAX) 0.5 MG tablet    Sig: Take 1 tablet (0.5 mg total) by mouth at bedtime as needed for anxiety.    Dispense:  30 tablet    Refill:  3  . doxycycline (VIBRA-TABS) 100 MG tablet    Sig: Take 1 tablet (100 mg total) by mouth 2 (two) times daily.    Dispense:  20 tablet    Refill:  0   A total of 25 minutes of face to face time  was spent with patient more than half of which was spent in counselling about the above mentioned conditions  and coordination of care  Medications Discontinued During This Encounter  Medication Reason  . amoxicillin-clavulanate (AUGMENTIN) 875-125 MG tablet Therapy completed  . fluconazole (DIFLUCAN) 150 MG tablet Therapy completed  . predniSONE (DELTASONE) 10 MG tablet Therapy completed  . EPINEPHrine 0.3 mg/0.3 mL IJ SOAJ injection Reorder  . montelukast (SINGULAIR) 10 MG tablet Reorder  . ALPRAZolam (XANAX) 0.5 MG tablet Reorder    Follow-up: Return in about 3 months (around 08/28/2017).   Crecencio Mc, MD

## 2017-05-28 NOTE — Patient Instructions (Addendum)
  You might want to try a premixed protein drink called Premier Protein shake for breakfast or late night snack . It is great tasting,   very low sugar and available of < $2 serving at [facility] and  In bulk for $1.50/serving at [employer] and [employer]  .     [facility] VERSION  HAS THEIR OWN VERSION IN THE EQUATE BRAND  SAME TASTE AND NUTRITIONAL  ANALYSIS  Nutritional analysis :  160 cal  30 g protein  1 g sugar 50% calcium needs  AVAILABLE AT  [facility] and [employer]  You can use a stool softener and any bulk forming laxative every single day . Try TAKING THE BFL 30 MINUTES PRIOR TO BIGGEST MEAL TO TAKE UP SPACE IN STOMACH   START EXERCISING REGULARLY.  FIGURE OUT HOW TO GET 30 MINUTES OF CARDIO IN BEFORE YOUR SHIFT   GOAL 5 DAYS PER WEEK   FEVER (TEMP OF 100.4 OR HIGHER) AND A HEADACHE IN THE SUMMERTIME IS  [place] SPOTTED FEVER UNTIL PROVEN OTHERWISE!  TAKE THE DOXYCYCLINE IF THIS HAPPENS

## 2017-05-29 ENCOUNTER — Encounter: Payer: Self-pay | Admitting: Internal Medicine

## 2017-05-29 DIAGNOSIS — D582 Other hemoglobinopathies: Secondary | ICD-10-CM

## 2017-05-29 HISTORY — DX: Other hemoglobinopathies: D58.2

## 2017-05-29 NOTE — Assessment & Plan Note (Signed)
Mild, stable, with no evidence of HH or PCV.  checking iron studies next visit , encouraged to increase hydration

## 2017-05-29 NOTE — Assessment & Plan Note (Signed)
Suggested by low normal level and concurrent fatigue.  Continue monthly b12 injections via self administration

## 2017-05-29 NOTE — Assessment & Plan Note (Signed)
I have noted a slowing in the reduction of   BMI with use of Saxenda (net loss of 23 lbs in 6 months) and encouraged  Participation in regular exercise to continue  weight loss with goal of 10% of body weight over the next 6 months using a low glycemic index diet , Saxenda.  Advised to add  a daily bulk forming laxative (Citrucel, Metamucil, Benefiber, Miralax , or Fibercon) 30 minutes prior to her largest meal to increase satiety.

## 2017-05-29 NOTE — Assessment & Plan Note (Signed)
Well controlled on current regimen. Renal function stable, no changes today.  Lab Results  Component Value Date   CREATININE 0.95 02/21/2017   Lab Results  Component Value Date   NA 138 02/21/2017   K 3.8 02/21/2017   CL 103 02/21/2017   CO2 30 02/21/2017

## 2017-07-13 ENCOUNTER — Other Ambulatory Visit: Payer: Self-pay | Admitting: Internal Medicine

## 2017-08-29 ENCOUNTER — Telehealth: Payer: Self-pay | Admitting: Internal Medicine

## 2017-08-29 ENCOUNTER — Ambulatory Visit: Payer: BC Managed Care – PPO | Admitting: Internal Medicine

## 2017-08-29 DIAGNOSIS — Z0289 Encounter for other administrative examinations: Secondary | ICD-10-CM

## 2017-08-29 NOTE — Telephone Encounter (Signed)
FYI - Pt called and cancelled appt for this morning, got called into work.

## 2017-08-29 NOTE — Telephone Encounter (Signed)
Dr. Derrel Nip is aware and pt has been no showed.

## 2017-09-27 ENCOUNTER — Encounter: Payer: Self-pay | Admitting: Obstetrics and Gynecology

## 2017-09-27 ENCOUNTER — Ambulatory Visit (INDEPENDENT_AMBULATORY_CARE_PROVIDER_SITE_OTHER): Payer: BC Managed Care – PPO | Admitting: Obstetrics and Gynecology

## 2017-09-27 VITALS — BP 115/74 | HR 89 | Ht 66.0 in | Wt 230.7 lb

## 2017-09-27 DIAGNOSIS — R634 Abnormal weight loss: Secondary | ICD-10-CM

## 2017-09-27 DIAGNOSIS — E669 Obesity, unspecified: Secondary | ICD-10-CM

## 2017-09-27 DIAGNOSIS — Z01419 Encounter for gynecological examination (general) (routine) without abnormal findings: Secondary | ICD-10-CM | POA: Diagnosis not present

## 2017-09-27 DIAGNOSIS — Z1239 Encounter for other screening for malignant neoplasm of breast: Secondary | ICD-10-CM

## 2017-09-27 DIAGNOSIS — R87612 Low grade squamous intraepithelial lesion on cytologic smear of cervix (LGSIL): Secondary | ICD-10-CM | POA: Diagnosis not present

## 2017-09-27 DIAGNOSIS — Z975 Presence of (intrauterine) contraceptive device: Secondary | ICD-10-CM | POA: Diagnosis not present

## 2017-09-27 NOTE — Patient Instructions (Signed)
1.  Pap smear is done 2.  Mammogram is ordered 3.  Screening labs are obtained through primary care 4.  Continue with healthy eating exercise and controlled weight loss 5.  Contraception-IUD 6.  Return in 1 year for annual exam  Health Maintenance, Female Adopting a healthy lifestyle and getting preventive care can go a long way to promote health and wellness. Talk with your health care provider about what schedule of regular examinations is right for you. This is a good chance for you to check in with your provider about disease prevention and staying healthy. In between checkups, there are plenty of things you can do on your own. Experts have done a lot of research about which lifestyle changes and preventive measures are most likely to keep you healthy. Ask your health care provider for more information. Weight and diet Eat a healthy diet  Be sure to include plenty of vegetables, fruits, low-fat dairy products, and lean protein.  Do not eat a lot of foods high in solid fats, added sugars, or salt.  Get regular exercise. This is one of the most important things you can do for your health. ? Most adults should exercise for at least 150 minutes each week. The exercise should increase your heart rate and make you sweat (moderate-intensity exercise). ? Most adults should also do strengthening exercises at least twice a week. This is in addition to the moderate-intensity exercise.  Maintain a healthy weight  Body mass index (BMI) is a measurement that can be used to identify possible weight problems. It estimates body fat based on height and weight. Your health care provider can help determine your BMI and help you achieve or maintain a healthy weight.  For females 73 years of age and older: ? A BMI below 18.5 is considered underweight. ? A BMI of 18.5 to 24.9 is normal. ? A BMI of 25 to 29.9 is considered overweight. ? A BMI of 30 and above is considered obese.  Watch levels of  cholesterol and blood lipids  You should start having your blood tested for lipids and cholesterol at 46 years of age, then have this test every 5 years.  You may need to have your cholesterol levels checked more often if: ? Your lipid or cholesterol levels are high. ? You are older than 46 years of age. ? You are at high risk for heart disease.  Cancer screening Lung Cancer  Lung cancer screening is recommended for adults 28-41 years old who are at high risk for lung cancer because of a history of smoking.  A yearly low-dose CT scan of the lungs is recommended for people who: ? Currently smoke. ? Have quit within the past 15 years. ? Have at least a 30-pack-year history of smoking. A pack year is smoking an average of one pack of cigarettes a day for 1 year.  Yearly screening should continue until it has been 15 years since you quit.  Yearly screening should stop if you develop a health problem that would prevent you from having lung cancer treatment.  Breast Cancer  Practice breast self-awareness. This means understanding how your breasts normally appear and feel.  It also means doing regular breast self-exams. Let your health care provider know about any changes, no matter how small.  If you are in your 20s or 30s, you should have a clinical breast exam (CBE) by a health care provider every 1-3 years as part of a regular health exam.  If you  are 16 or older, have a CBE every year. Also consider having a breast X-ray (mammogram) every year.  If you have a family history of breast cancer, talk to your health care provider about genetic screening.  If you are at high risk for breast cancer, talk to your health care provider about having an MRI and a mammogram every year.  Breast cancer gene (BRCA) assessment is recommended for women who have family members with BRCA-related cancers. BRCA-related cancers include: ? Breast. ? Ovarian. ? Tubal. ? Peritoneal cancers.  Results  of the assessment will determine the need for genetic counseling and BRCA1 and BRCA2 testing.  Cervical Cancer Your health care provider may recommend that you be screened regularly for cancer of the pelvic organs (ovaries, uterus, and vagina). This screening involves a pelvic examination, including checking for microscopic changes to the surface of your cervix (Pap test). You may be encouraged to have this screening done every 3 years, beginning at age 53.  For women ages 26-65, health care providers may recommend pelvic exams and Pap testing every 3 years, or they may recommend the Pap and pelvic exam, combined with testing for human papilloma virus (HPV), every 5 years. Some types of HPV increase your risk of cervical cancer. Testing for HPV may also be done on women of any age with unclear Pap test results.  Other health care providers may not recommend any screening for nonpregnant women who are considered low risk for pelvic cancer and who do not have symptoms. Ask your health care provider if a screening pelvic exam is right for you.  If you have had past treatment for cervical cancer or a condition that could lead to cancer, you need Pap tests and screening for cancer for at least 20 years after your treatment. If Pap tests have been discontinued, your risk factors (such as having a new sexual partner) need to be reassessed to determine if screening should resume. Some women have medical problems that increase the chance of getting cervical cancer. In these cases, your health care provider may recommend more frequent screening and Pap tests.  Colorectal Cancer  This type of cancer can be detected and often prevented.  Routine colorectal cancer screening usually begins at 46 years of age and continues through 46 years of age.  Your health care provider may recommend screening at an earlier age if you have risk factors for colon cancer.  Your health care provider may also recommend using  home test kits to check for hidden blood in the stool.  A small camera at the end of a tube can be used to examine your colon directly (sigmoidoscopy or colonoscopy). This is done to check for the earliest forms of colorectal cancer.  Routine screening usually begins at age 12.  Direct examination of the colon should be repeated every 5-10 years through 46 years of age. However, you may need to be screened more often if early forms of precancerous polyps or small growths are found.  Skin Cancer  Check your skin from head to toe regularly.  Tell your health care provider about any new moles or changes in moles, especially if there is a change in a mole's shape or color.  Also tell your health care provider if you have a mole that is larger than the size of a pencil eraser.  Always use sunscreen. Apply sunscreen liberally and repeatedly throughout the day.  Protect yourself by wearing long sleeves, pants, a wide-brimmed hat, and sunglasses whenever  you are outside.  Heart disease, diabetes, and high blood pressure  High blood pressure causes heart disease and increases the risk of stroke. High blood pressure is more likely to develop in: ? People who have blood pressure in the high end of the normal range (130-139/85-89 mm Hg). ? People who are overweight or obese. ? People who are African American.  If you are 34-90 years of age, have your blood pressure checked every 3-5 years. If you are 17 years of age or older, have your blood pressure checked every year. You should have your blood pressure measured twice-once when you are at a hospital or clinic, and once when you are not at a hospital or clinic. Record the average of the two measurements. To check your blood pressure when you are not at a hospital or clinic, you can use: ? An automated blood pressure machine at a pharmacy. ? A home blood pressure monitor.  If you are between 5 years and 68 years old, ask your health care provider  if you should take aspirin to prevent strokes.  Have regular diabetes screenings. This involves taking a blood sample to check your fasting blood sugar level. ? If you are at a normal weight and have a low risk for diabetes, have this test once every three years after 46 years of age. ? If you are overweight and have a high risk for diabetes, consider being tested at a younger age or more often. Preventing infection Hepatitis B  If you have a higher risk for hepatitis B, you should be screened for this virus. You are considered at high risk for hepatitis B if: ? You were born in a country where hepatitis B is common. Ask your health care provider which countries are considered high risk. ? Your parents were born in a high-risk country, and you have not been immunized against hepatitis B (hepatitis B vaccine). ? You have HIV or AIDS. ? You use needles to inject street drugs. ? You live with someone who has hepatitis B. ? You have had sex with someone who has hepatitis B. ? You get hemodialysis treatment. ? You take certain medicines for conditions, including cancer, organ transplantation, and autoimmune conditions.  Hepatitis C  Blood testing is recommended for: ? Everyone born from [birth year] through 1965. ? Anyone with known risk factors for hepatitis C.  Sexually transmitted infections (STIs)  You should be screened for sexually transmitted infections (STIs) including gonorrhea and chlamydia if: ? You are sexually active and are younger than 46 years of age. ? You are older than 46 years of age and your health care provider tells you that you are at risk for this type of infection. ? Your sexual activity has changed since you were last screened and you are at an increased risk for chlamydia or gonorrhea. Ask your health care provider if you are at risk.  If you do not have HIV, but are at risk, it may be recommended that you take a prescription medicine daily to prevent HIV infection. This  is called pre-exposure prophylaxis (PrEP). You are considered at risk if: ? You are sexually active and do not regularly use condoms or know the HIV status of your partner(s). ? You take drugs by injection. ? You are sexually active with a partner who has HIV.  Talk with your health care provider about whether you are at high risk of being infected with HIV. If you choose to begin PrEP, you should first  be tested for HIV. You should then be tested every 3 months for as long as you are taking PrEP. Pregnancy  If you are premenopausal and you may become pregnant, ask your health care provider about preconception counseling.  If you may become pregnant, take 400 to 800 micrograms (mcg) of folic acid every day.  If you want to prevent pregnancy, talk to your health care provider about birth control (contraception). Osteoporosis and menopause  Osteoporosis is a disease in which the bones lose minerals and strength with aging. This can result in serious bone fractures. Your risk for osteoporosis can be identified using a bone density scan.  If you are 69 years of age or older, or if you are at risk for osteoporosis and fractures, ask your health care provider if you should be screened.  Ask your health care provider whether you should take a calcium or vitamin D supplement to lower your risk for osteoporosis.  Menopause may have certain physical symptoms and risks.  Hormone replacement therapy may reduce some of these symptoms and risks. Talk to your health care provider about whether hormone replacement therapy is right for you. Follow these instructions at home:  Schedule regular health, dental, and eye exams.  Stay current with your immunizations.  Do not use any tobacco products including cigarettes, chewing tobacco, or electronic cigarettes.  If you are pregnant, do not drink alcohol.  If you are breastfeeding, limit how much and how often you drink alcohol.  Limit alcohol intake  to no more than 1 drink per day for nonpregnant women. One drink equals 12 ounces of beer, 5 ounces of wine, or 1 ounces of hard liquor.  Do not use street drugs.  Do not share needles.  Ask your health care provider for help if you need support or information about quitting drugs.  Tell your health care provider if you often feel depressed.  Tell your health care provider if you have ever been abused or do not feel safe at home. This information is not intended to replace advice given to you by your health care provider. Make sure you discuss any questions you have with your health care provider. Document Released: 05/22/2011 Document Revised: 04/13/2016 Document Reviewed: 08/10/2015 Elsevier Interactive Patient Education  [employer].

## 2017-09-27 NOTE — Progress Notes (Signed)
ANNUAL PREVENTATIVE CARE GYN  ENCOUNTER NOTE  Subjective:       Laura [name] is a 46 y.o. G5P0020 female here for a routine annual gynecologic exam.  Current complaints: 1.   none  Using IUD, Mirena, for contraception Occasional vasomotor symptoms are noted. Still having occasional spotting without regular cycles. Patient has lost 21 pounds in the past year.  She feels very healthy upbeat. Bowel function is notable for mild constipation which is managed with a stool softener.  Bladder function is normal.   Gynecologic History No LMP recorded (lmp unknown). Patient is not currently having periods (Reason: IUD). Contraception: IUD Last Pap: lgisl/neg- 09/2016 Results were: abn Last mammogram: 09/2016  birad 1. Results were: normal  Obstetric History OB History  Gravida Para Term Preterm AB Living  2       2    SAB TAB Ectopic Multiple Live Births  0            # Outcome Date GA Lbr Len/2nd Weight Sex Delivery Anes PTL Lv  2 AB              Complications: Missed abortion  1 AB              Complications: Missed abortion      Past Medical History:  Diagnosis Date  . Allergy   . Dysplasia of cervix   . Herpes genitalia   . Hypertension    HX of high readings   . Increased BMI   . Menstrual migraine     Past Surgical History:  Procedure Laterality Date  . DILATION AND CURETTAGE OF UTERUS  2014 FEB and August  . MOUTH SURGERY    . TONSILECTOMY/ADENOIDECTOMY WITH MYRINGOTOMY Bilateral 2011    Current Outpatient Medications on File Prior to Visit  Medication Sig Dispense Refill  . ALPRAZolam (XANAX) 0.5 MG tablet Take 1 tablet (0.5 mg total) by mouth at bedtime as needed for anxiety. 30 tablet 3  . cyanocobalamin (,VITAMIN B-12,) 1000 MCG/ML injection Inject 1 mL (1,000 mcg total) into the muscle every 30 (thirty) days. 10 mL 1  . hydrochlorothiazide (HYDRODIURIL) 25 MG tablet Take 1 tablet (25 mg total) by mouth daily. 90 tablet 3  . levonorgestrel (MIRENA) 20  MCG/24HR IUD 1 each by Intrauterine route once.    . Liraglutide -Weight Management (SAXENDA) 18 MG/3ML SOPN Inject 3 mg into the skin daily. 9 mL 5  . SYRINGE-NEEDLE, DISP, 3 ML 25G X 1" 3 ML MISC Use for b12 injections 50 each 0   No current facility-administered medications on file prior to visit.     No Known Allergies  Social History   Socioeconomic History  . Marital status: Married    Spouse name: Not on file  . Number of children: Not on file  . Years of education: Not on file  . Highest education level: Not on file  Social Needs  . Financial resource strain: Not on file  . Food insecurity - worry: Not on file  . Food insecurity - inability: Not on file  . Transportation needs - medical: Not on file  . Transportation needs - non-medical: Not on file  Occupational History  . Occupation: Solicitor: [employer]  Tobacco Use  . Smoking status: Never Smoker  . Smokeless tobacco: Never Used  Substance and Sexual Activity  . Alcohol use: Yes    Comment: occasioally  . Drug use: No  . Sexual activity: Yes  Birth control/protection: IUD  Other Topics Concern  . Not on file  Social History Narrative  . Not on file    Family History  Problem Relation Age of Onset  . Arthritis Mother   . Hypertension Mother   . Diabetes Mother   . Heart disease Father   . Hyperlipidemia Father   . Hypertension Father   . Cancer Neg Hx   . Breast cancer Neg Hx     The following portions of the patient's history were reviewed and updated as appropriate: allergies, current medications, past family history, past medical history, past social history, past surgical history and problem list.  Review of Systems Review of Systems  Constitutional: Negative.        Occasional hot flash  HENT: Negative.   Eyes: Negative.   Respiratory: Negative.   Cardiovascular: Negative.   Gastrointestinal: Negative.   Genitourinary: Negative.        Vaginal spotting only; no  regular cycles (Mirena IUD)  Musculoskeletal: Negative.   Skin: Negative.   Neurological: Negative.   Endo/Heme/Allergies: Negative.   Psychiatric/Behavioral: Negative.      Objective:  BP 115/74   Pulse 89   Ht 5\' 6"  (1.676 m)   Wt 230 lb 11.2 oz (104.6 kg)   LMP  (LMP Unknown)   BMI 37.24 kg/m  CONSTITUTIONAL: Well-developed, well-nourished female in no acute distress.  PSYCHIATRIC: Normal mood and affect. Normal behavior. Normal judgment and thought content. [place]: Alert and oriented to person, place, and time. Normal muscle tone coordination. No cranial nerve deficit noted. HENT:  Normocephalic, atraumatic, External right and left ear normal. Oropharynx is clear and moist EYES: Conjunctivae and EOM are normal. No scleral icterus.  NECK: Normal range of motion, supple, no masses.  Normal thyroid.  SKIN: Skin is warm and dry. No rash noted. Not diaphoretic. No erythema. No pallor. CARDIOVASCULAR: Normal heart rate noted, regular rhythm, no murmur. RESPIRATORY: Clear to auscultation bilaterally. Effort and breath sounds normal, no problems with respiration noted. BREASTS: Symmetric in size. No masses, skin changes, nipple drainage, or lymphadenopathy. ABDOMEN: Soft, normal bowel sounds, no distention noted.  No tenderness, rebound or guarding.  BLADDER: Normal PELVIC:  External Genitalia: Normal  BUS: Normal  Vagina: Normal  Cervix: Normal; IUD strings 3 cm, no cervical motion tenderness  Uterus: Normal; midplane, normal size and shape, mobile, nontender  Adnexa: Normal; nonpalpable and nontender  RV: External Exam NormaI, No Rectal Masses and Normal Sphincter tone  MUSCULOSKELETAL: Normal range of motion. No tenderness.  No cyanosis, clubbing, or edema.  2+ distal pulses. LYMPHATIC: No Axillary, Supraclavicular, or Inguinal Adenopathy.    Assessment:   Annual gynecologic examination 46 y.o. Contraception: IUD bmi-35; 30 pound weight gain this year 21 pound weight  loss   Plan:  Pap: pap/hpv Mammogram: Ordered Stool Guaiac Testing:  Not Indicated Labs: thru pcp Routine preventative health maintenance measures emphasized: Exercise/Diet/Weight control, Tobacco Warnings and Alcohol/Substance use risks Return to [place], CMA  Brayton Mars, MD   Note: This dictation was prepared with Dragon dictation along with smaller phrase technology. Any transcriptional errors that result from this process are unintentional.

## 2017-10-01 ENCOUNTER — Ambulatory Visit: Payer: BC Managed Care – PPO | Admitting: Internal Medicine

## 2017-10-01 ENCOUNTER — Encounter: Payer: Self-pay | Admitting: Internal Medicine

## 2017-10-01 VITALS — BP 118/62 | HR 75 | Temp 98.1°F | Resp 15 | Ht 66.0 in | Wt 231.0 lb

## 2017-10-01 DIAGNOSIS — E669 Obesity, unspecified: Secondary | ICD-10-CM | POA: Diagnosis not present

## 2017-10-01 DIAGNOSIS — I10 Essential (primary) hypertension: Secondary | ICD-10-CM

## 2017-10-01 DIAGNOSIS — Z23 Encounter for immunization: Secondary | ICD-10-CM

## 2017-10-01 LAB — COMPREHENSIVE METABOLIC PANEL
ALBUMIN: 4.1 g/dL (ref 3.5–5.2)
ALK PHOS: 46 U/L (ref 39–117)
ALT: 24 U/L (ref 0–35)
AST: 15 U/L (ref 0–37)
BUN: 13 mg/dL (ref 6–23)
CO2: 33 mEq/L — ABNORMAL HIGH (ref 19–32)
Calcium: 9.4 mg/dL (ref 8.4–10.5)
Chloride: 99 mEq/L (ref 96–112)
Creatinine, Ser: 0.81 mg/dL (ref 0.40–1.20)
GFR: 80.64 mL/min (ref >60.00)
Glucose, Bld: 90 mg/dL (ref 70–99)
POTASSIUM: 3.2 meq/L — AB (ref 3.5–5.1)
Sodium: 140 mEq/L (ref 135–145)
TOTAL PROTEIN: 7.1 g/dL (ref 6.0–8.3)
Total Bilirubin: 0.9 mg/dL (ref 0.2–1.2)

## 2017-10-01 MED ORDER — LIRAGLUTIDE -WEIGHT MANAGEMENT 18 MG/3ML ~~LOC~~ SOPN: 3.0000 mg | PEN_INJECTOR | Freq: Every day | SUBCUTANEOUS | 5 refills | Status: DC

## 2017-10-01 NOTE — Patient Instructions (Signed)
Your weight loss to date is 28 lbs!!!!  (over 10% of your body weight since January)  Add exercise to maintain a negative balance   You can continue the Saxenda as long as you are losing weight.  See you in 3 months

## 2017-10-01 NOTE — Progress Notes (Signed)
Subjective:  Patient ID: Laura [name], female    DOB: 07/19/1971  Age: 46 y.o. MRN: [id number]  CC: The primary encounter diagnosis was Essential hypertension. Diagnoses of Need for immunization against influenza and Obesity (BMI 35.0-39.9 without comorbidity) were also pertinent to this visit.  HPI Laura [name] presents for follow up on chronic issues including obesity managed with Saxenda .    since starting the medication in January she has achieved a weight loss of 28 lbs   (5 lbs since her last  visit ). Initial weight was 259 ,  Now 231 .  She has noted a slowing of the rate of change and reports that she is not exercising regularly due to her work schedule which frequently shifts between night and day shifts that are 12 hours long .    Outpatient Medications Prior to Visit  Medication Sig Dispense Refill  . ALPRAZolam (XANAX) 0.5 MG tablet Take 1 tablet (0.5 mg total) by mouth at bedtime as needed for anxiety. 30 tablet 3  . cyanocobalamin (,VITAMIN B-12,) 1000 MCG/ML injection Inject 1 mL (1,000 mcg total) into the muscle every 30 (thirty) days. 10 mL 1  . levonorgestrel (MIRENA) 20 MCG/24HR IUD 1 each by Intrauterine route once.    . SYRINGE-NEEDLE, DISP, 3 ML 25G X 1" 3 ML MISC Use for b12 injections 50 each 0  . hydrochlorothiazide (HYDRODIURIL) 25 MG tablet Take 1 tablet (25 mg total) by mouth daily. 90 tablet 3  . Liraglutide -Weight Management (SAXENDA) 18 MG/3ML SOPN Inject 3 mg into the skin daily. 9 mL 5   No facility-administered medications prior to visit.     Review of Systems;  Patient denies headache, fevers, malaise, unintentional weight loss, skin rash, eye pain, sinus congestion and sinus pain, sore throat, dysphagia,  hemoptysis , cough, dyspnea, wheezing, chest pain, palpitations, orthopnea, edema, abdominal pain, nausea, melena, diarrhea, constipation, flank pain, dysuria, hematuria, urinary  Frequency, nocturia, numbness, tingling, seizures,  Focal  weakness, Loss of consciousness,  Tremor, insomnia, depression, anxiety, and suicidal ideation.      Objective:  BP 118/62 (BP Location: Left Arm, Patient Position: Sitting, Cuff Size: Large)   Pulse 75   Temp 98.1 F (36.7 C) (Oral)   Resp 15   Ht 5\' 6"  (1.676 m)   Wt 231 lb (104.8 kg)   LMP  (LMP Unknown)   SpO2 97%   BMI 37.28 kg/m   BP Readings from Last 3 Encounters:  10/01/17 118/62  09/27/17 115/74  05/28/17 110/66    Wt Readings from Last 3 Encounters:  10/01/17 231 lb (104.8 kg)  09/27/17 230 lb 11.2 oz (104.6 kg)  05/28/17 236 lb 3.2 oz (107.1 kg)    General appearance: alert, cooperative and appears stated age Ears: normal TM's and external ear canals both ears Throat: lips, mucosa, and tongue normal; teeth and gums normal Neck: no adenopathy, no carotid bruit, supple, symmetrical, trachea midline and thyroid not enlarged, symmetric, no tenderness/mass/nodules Back: symmetric, no curvature. ROM normal. No CVA tenderness. Lungs: clear to auscultation bilaterally Heart: regular rate and rhythm, S1, S2 normal, no murmur, click, rub or gallop Abdomen: soft, non-tender; bowel sounds normal; no masses,  no organomegaly Pulses: 2+ and symmetric Skin: Skin color, texture, turgor normal. No rashes or lesions Lymph nodes: Cervical, supraclavicular, and axillary nodes normal.  Lab Results  Component Value Date   HGBA1C 5.6 11/22/2016    Lab Results  Component Value Date   CREATININE 0.81 10/01/2017  CREATININE 0.95 02/21/2017   CREATININE 0.84 11/22/2016    Lab Results  Component Value Date   WBC 5.7 05/28/2017   HGB 15.4 (H) 05/28/2017   HCT 44.9 05/28/2017   PLT 270.0 05/28/2017   GLUCOSE 90 10/01/2017   CHOL 162 11/22/2016   TRIG 139.0 11/22/2016   HDL 57.00 11/22/2016   LDLDIRECT 59.0 11/22/2016   LDLCALC 77 11/22/2016   ALT 24 10/01/2017   AST 15 10/01/2017   NA 140 10/01/2017   K 3.2 (L) 10/01/2017   CL 99 10/01/2017   CREATININE 0.81  10/01/2017   BUN 13 10/01/2017   CO2 33 (H) 10/01/2017   TSH 1.74 11/22/2016   HGBA1C 5.6 11/22/2016   MICROALBUR 0.9 11/22/2016    Mm Screening Breast Tomo Bilateral  Result Date: 09/28/2016 CLINICAL DATA:  Screening. EXAM: 2D DIGITAL SCREENING BILATERAL MAMMOGRAM WITH CAD AND ADJUNCT TOMO COMPARISON:  Previous exam(s). ACR Breast Density Category b: There are scattered areas of fibroglandular density. FINDINGS: There are no findings suspicious for malignancy. Images were processed with CAD. IMPRESSION: No mammographic evidence of malignancy. A result letter of this screening mammogram will be mailed directly to the patient. RECOMMENDATION: Screening mammogram in one year. (Code:SM-B-01Y) BI-RADS CATEGORY  1: Negative. Electronically Signed   By: Pamelia Hoit M.D.   On: 09/28/2016 18:15    Assessment & Plan:   Problem List Items Addressed This Visit    Essential hypertension - Primary    Well controlled on current regimen. Renal function stable, but potasssium is low on hctz,  Will change to maxzide  Lab Results  Component Value Date   CREATININE 0.81 10/01/2017   Lab Results  Component Value Date   NA 140 10/01/2017   K 3.2 (L) 10/01/2017   CL 99 10/01/2017   CO2 33 (H) 10/01/2017         Relevant Medications   triamterene-hydrochlorothiazide (MAXZIDE-25) 37.5-25 MG tablet   Other Relevant Orders   Comprehensive metabolic panel (Completed)   Obesity (BMI 35.0-39.9 without comorbidity)    I have congratulated her in reduction of   BMI.  She has lost 11% over 10 months and encouraged  Continued weight loss using Saxenda with a minimum  goal of 4%  Of body  weight  Or 10 lbs over the next  4  months using a low glycemic index diet and regular exercise a minimum of 5 days per week.        Relevant Medications   Liraglutide -Weight Management (SAXENDA) 18 MG/3ML SOPN    Other Visit Diagnoses    Need for immunization against influenza       Relevant Orders   Flu Vaccine QUAD  36+ mos IM (Completed)      I have discontinued Connor A. Bickford "Andrea"'s hydrochlorothiazide. I have also changed her Liraglutide -Weight Management. Additionally, I am having her start on triamterene-hydrochlorothiazide. Lastly, I am having her maintain her levonorgestrel, SYRINGE-NEEDLE (DISP) 3 ML, cyanocobalamin, and ALPRAZolam.  Meds ordered this encounter  Medications  . Liraglutide -Weight Management (SAXENDA) 18 MG/3ML SOPN    Sig: Inject 3 mg daily into the skin.    Dispense:  9 mL    Refill:  5    Keep on file for future refills  . triamterene-hydrochlorothiazide (MAXZIDE-25) 37.5-25 MG tablet    Sig: Take 0.5 tablets daily by mouth.    Dispense:  90 tablet    Refill:  0    Medications Discontinued During This Encounter  Medication Reason  .  Liraglutide -Weight Management (SAXENDA) 18 MG/3ML SOPN Reorder  . hydrochlorothiazide (HYDRODIURIL) 25 MG tablet     Follow-up: Return in about 3 months (around 01/01/2018) for saxenda use /hypertension.   Crecencio Mc, MD

## 2017-10-02 MED ORDER — TRIAMTERENE-HCTZ 37.5-25 MG PO TABS: 0.5000 | ORAL_TABLET | Freq: Every day | ORAL | 0 refills | Status: DC

## 2017-10-02 NOTE — Assessment & Plan Note (Signed)
Well controlled on current regimen. Renal function stable, but potasssium is low on hctz,  Will change to maxzide  Lab Results  Component Value Date   CREATININE 0.81 10/01/2017   Lab Results  Component Value Date   NA 140 10/01/2017   K 3.2 (L) 10/01/2017   CL 99 10/01/2017   CO2 33 (H) 10/01/2017

## 2017-10-02 NOTE — Assessment & Plan Note (Addendum)
I have congratulated her in reduction of   BMI.  She has lost 11% over 10 months and encouraged  Continued weight loss using Saxenda with a minimum  goal of 4%  Of body  weight  Or 10 lbs over the next  4  months using a low glycemic index diet and regular exercise a minimum of 5 days per week.

## 2017-10-06 LAB — IGP, COBASHPV16/18: PAP Smear Comment: 0

## 2017-10-06 LAB — SPECIMEN STATUS REPORT

## 2017-10-09 ENCOUNTER — Encounter: Payer: Self-pay | Admitting: Internal Medicine

## 2017-10-10 ENCOUNTER — Other Ambulatory Visit: Payer: Self-pay | Admitting: Internal Medicine

## 2017-10-10 MED ORDER — LOSARTAN POTASSIUM-HCTZ 50-12.5 MG PO TABS: 1.0000 | ORAL_TABLET | Freq: Every day | ORAL | 3 refills | Status: DC

## 2017-11-09 ENCOUNTER — Ambulatory Visit
Admission: RE | Admit: 2017-11-09 | Discharge: 2017-11-09 | Disposition: A | Discharge status: Home / Self Care | Payer: BC Managed Care – PPO | Source: Ambulatory Visit | Attending: Obstetrics and Gynecology | Admitting: Obstetrics and Gynecology

## 2017-11-09 DIAGNOSIS — Z1231 Encounter for screening mammogram for malignant neoplasm of breast: Secondary | ICD-10-CM | POA: Diagnosis present

## 2017-11-09 DIAGNOSIS — Z1239 Encounter for other screening for malignant neoplasm of breast: Secondary | ICD-10-CM

## 2017-12-26 ENCOUNTER — Ambulatory Visit: Payer: BC Managed Care – PPO | Admitting: Internal Medicine

## 2017-12-26 ENCOUNTER — Encounter: Payer: Self-pay | Admitting: Internal Medicine

## 2017-12-26 VITALS — BP 110/78 | HR 96 | Temp 98.3°F | Resp 16 | Ht 66.0 in | Wt 223.8 lb

## 2017-12-26 DIAGNOSIS — I10 Essential (primary) hypertension: Secondary | ICD-10-CM | POA: Diagnosis not present

## 2017-12-26 DIAGNOSIS — E669 Obesity, unspecified: Secondary | ICD-10-CM

## 2017-12-26 DIAGNOSIS — F411 Generalized anxiety disorder: Secondary | ICD-10-CM | POA: Diagnosis not present

## 2017-12-26 DIAGNOSIS — R1084 Generalized abdominal pain: Secondary | ICD-10-CM | POA: Diagnosis not present

## 2017-12-26 DIAGNOSIS — M25512 Pain in left shoulder: Secondary | ICD-10-CM

## 2017-12-26 DIAGNOSIS — Z630 Problems in relationship with spouse or partner: Secondary | ICD-10-CM | POA: Diagnosis not present

## 2017-12-26 LAB — COMPREHENSIVE METABOLIC PANEL
ALK PHOS: 44 U/L (ref 39–117)
ALT: 29 U/L (ref 0–35)
AST: 21 U/L (ref 0–37)
Albumin: 4 g/dL (ref 3.5–5.2)
BUN: 12 mg/dL (ref 6–23)
CHLORIDE: 100 meq/L (ref 96–112)
CO2: 33 mEq/L — ABNORMAL HIGH (ref 19–32)
Calcium: 9.1 mg/dL (ref 8.4–10.5)
Creatinine, Ser: 0.92 mg/dL (ref 0.40–1.20)
GFR: 69.55 mL/min (ref >60.00)
GLUCOSE: 99 mg/dL (ref 70–99)
Potassium: 3.8 mEq/L (ref 3.5–5.1)
SODIUM: 138 meq/L (ref 135–145)
TOTAL PROTEIN: 7 g/dL (ref 6.0–8.3)
Total Bilirubin: 1.2 mg/dL (ref 0.2–1.2)

## 2017-12-26 LAB — LIPASE: Lipase: 17 U/L (ref 11.0–59.0)

## 2017-12-26 MED ORDER — DESVENLAFAXINE SUCCINATE ER 50 MG PO TB24: 50.0000 mg | ORAL_TABLET | Freq: Every day | ORAL | 3 refills | Status: DC

## 2017-12-26 MED ORDER — FAMOTIDINE 20 MG PO TABS: 20.0000 mg | ORAL_TABLET | Freq: Two times a day (BID) | ORAL | 0 refills | Status: DC

## 2017-12-26 NOTE — Progress Notes (Signed)
Subjective:  Patient ID: Laura [name], female    DOB: [birth year]  Age: 47 y.o. MRN: [id number]  CC: The primary encounter diagnosis was Generalized abdominal pain. Diagnoses of Marital dysfunction, GAD (generalized anxiety disorder), Obesity (BMI 35.0-39.9 without comorbidity), Left shoulder pain, unspecified chronicity, and Essential hypertension were also pertinent to this visit.  HPI Laura [name] presents for follow up on GAD, obesity on Saxenda , and hypertension   Obesity:  She has lost 8 lbs since November .  Her highest weight was  259  Now 223   GERD worse. Symptoms occurring daily , worse at night, and she is having Left shoulder pain after eating.  This pain is  not occurring daily, but lasts for 30 minutes  And is not accompanied by nausea or change in bowel habits.   GAD:  Significant marital conflict with spouse of > 4 years.  A total of 25 minutes of face to face time was spent with patient more than half of which was spent in counselling about her current emotional estrangement , consideration of marriage vs individual counselling,  and coordination of care     Outpatient Medications Prior to Visit  Medication Sig Dispense Refill  . ALPRAZolam (XANAX) 0.5 MG tablet Take 1 tablet (0.5 mg total) by mouth at bedtime as needed for anxiety. 30 tablet 3  . cyanocobalamin (,VITAMIN B-12,) 1000 MCG/ML injection Inject 1 mL (1,000 mcg total) into the muscle every 30 (thirty) days. 10 mL 1  . levonorgestrel (MIRENA) 20 MCG/24HR IUD 1 each by Intrauterine route once.    . Liraglutide -Weight Management (SAXENDA) 18 MG/3ML SOPN Inject 3 mg daily into the skin. 9 mL 5  . losartan-hydrochlorothiazide (HYZAAR) 50-12.5 MG tablet Take 1 tablet by mouth daily. 90 tablet 3  . SYRINGE-NEEDLE, DISP, 3 ML 25G X 1" 3 ML MISC Use for b12 injections 50 each 0   No facility-administered medications prior to visit.     Review of Systems;  Patient denies headache, fevers, malaise,  unintentional weight loss, skin rash, eye pain, sinus congestion and sinus pain, sore throat, dysphagia,  hemoptysis , cough, dyspnea, wheezing, chest pain, palpitations, orthopnea, edema, abdominal pain, nausea, melena, diarrhea, constipation, flank pain, dysuria, hematuria, urinary  Frequency, nocturia, numbness, tingling, seizures,  Focal weakness, Loss of consciousness,  Tremor, insomnia, depression, anxiety, and suicidal ideation.      Objective:  BP 110/78 (BP Location: Left Arm, Patient Position: Sitting, Cuff Size: Large)   Pulse 96   Temp 98.3 F (36.8 C) (Oral)   Resp 16   Ht 5\' 6"  (1.676 m)   Wt 223 lb 12.8 oz (101.5 kg)   SpO2 96%   BMI 36.12 kg/m   BP Readings from Last 3 Encounters:  12/26/17 110/78  10/01/17 118/62  09/27/17 115/74    Wt Readings from Last 3 Encounters:  12/26/17 223 lb 12.8 oz (101.5 kg)  10/01/17 231 lb (104.8 kg)  09/27/17 230 lb 11.2 oz (104.6 kg)    General appearance: alert, cooperative and appears stated age Ears: normal TM's and external ear canals both ears Throat: lips, mucosa, and tongue normal; teeth and gums normal Neck: no adenopathy, no carotid bruit, supple, symmetrical, trachea midline and thyroid not enlarged, symmetric, no tenderness/mass/nodules Back: symmetric, no curvature. ROM normal. No CVA tenderness. Lungs: clear to auscultation bilaterally Heart: regular rate and rhythm, S1, S2 normal, no murmur, click, rub or gallop Abdomen: soft, non-tender; bowel sounds normal; no masses,  no organomegaly Pulses:  2+ and symmetric Skin: Skin color, texture, turgor normal. No rashes or lesions Lymph nodes: Cervical, supraclavicular, and axillary nodes normal.  Lab Results  Component Value Date   HGBA1C 5.6 11/22/2016    Lab Results  Component Value Date   CREATININE 0.92 12/26/2017   CREATININE 0.81 10/01/2017   CREATININE 0.95 02/21/2017    Lab Results  Component Value Date   WBC 5.7 05/28/2017   HGB 15.4 (H)  05/28/2017   HCT 44.9 05/28/2017   PLT 270.0 05/28/2017   GLUCOSE 99 12/26/2017   CHOL 162 11/22/2016   TRIG 139.0 11/22/2016   HDL 57.00 11/22/2016   LDLDIRECT 59.0 11/22/2016   LDLCALC 77 11/22/2016   ALT 29 12/26/2017   AST 21 12/26/2017   NA 138 12/26/2017   K 3.8 12/26/2017   CL 100 12/26/2017   CREATININE 0.92 12/26/2017   BUN 12 12/26/2017   CO2 33 (H) 12/26/2017   TSH 1.74 11/22/2016   HGBA1C 5.6 11/22/2016   MICROALBUR 0.9 11/22/2016    Mm Screening Breast Tomo Bilateral  Result Date: 11/09/2017 CLINICAL DATA:  Screening. EXAM: 2D DIGITAL SCREENING BILATERAL MAMMOGRAM WITH CAD AND ADJUNCT TOMO COMPARISON:  Previous exam(s). ACR Breast Density Category b: There are scattered areas of fibroglandular density. FINDINGS: There are no findings suspicious for malignancy. Images were processed with CAD. IMPRESSION: No mammographic evidence of malignancy. A result letter of this screening mammogram will be mailed directly to the patient. RECOMMENDATION: Screening mammogram in one year. (Code:SM-B-01Y) BI-RADS CATEGORY  1: Negative. Electronically Signed   By: Fidela Salisbury M.D.   On: 11/09/2017 16:47    Assessment & Plan:   Problem List Items Addressed This Visit    Obesity (BMI 35.0-39.9 without comorbidity)    I have congratulated her in reduction of   BMI and encouraged  Continued weight loss with goal of 10% of body weigh over the next 6 months using a low glycemic index diet and regular exercise a minimum of 5 days per week. Continue Saxenda       Left shoulder pain    Referred from GI suspected givne tempral relationship with eating.  abd ultrasound ordered.Lipase and LFts are normal .  Lab Results  Component Value Date   LIPASE 17.0 12/26/2017   Lab Results  Component Value Date   ALT 29 12/26/2017   AST 21 12/26/2017   ALKPHOS 44 12/26/2017   BILITOT 1.2 12/26/2017         GAD (generalized anxiety disorder)    Secondary to marital conflict, now with  impending separation vs divorce.  Trial of Pristiq ,  Referral to [place] for marriage counselling      Relevant Medications   desvenlafaxine (PRISTIQ) 50 MG 24 hr tablet   Essential hypertension    Well controlled on current regimen. Renal function stable, no changes today.  Lab Results  Component Value Date   CREATININE 0.92 12/26/2017   Lab Results  Component Value Date   NA 138 12/26/2017   K 3.8 12/26/2017   CL 100 12/26/2017   CO2 33 (H) 12/26/2017          Other Visit Diagnoses    Generalized abdominal pain    -  Primary   Relevant Orders   US Abdomen Limited RUQ   Comprehensive metabolic panel (Completed)   Lipase (Completed)   Marital dysfunction       Relevant Orders   Ambulatory referral to Psychology    A total of 25 minutes  of face to face time was spent with patient more than half of which was spent in counselling about the above mentioned conditions  and coordination of care   I am having Nyomie A. Corinna Capra "Seth Bake" start on famotidine and desvenlafaxine. I am also having her maintain her levonorgestrel, SYRINGE-NEEDLE (DISP) 3 ML, cyanocobalamin, ALPRAZolam, Liraglutide -Weight Management, and losartan-hydrochlorothiazide.  Meds ordered this encounter  Medications  . famotidine (PEPCID) 20 MG tablet    Sig: Take 1 tablet (20 mg total) by mouth 2 (two) times daily.    Dispense:  60 tablet    Refill:  0  . desvenlafaxine (PRISTIQ) 50 MG 24 hr tablet    Sig: Take 1 tablet (50 mg total) by mouth daily.    Dispense:  30 tablet    Refill:  3    There are no discontinued medications.  Follow-up: Return in about 3 months (around 03/25/2018).   Crecencio Mc, MD

## 2017-12-26 NOTE — Patient Instructions (Addendum)
You are still losing weight!  Here are somoe exercises you can do at home (25 of each, on each side)   [employer] man   [place] twists     For your reflux: famotdine 20 mg twice daily.  If sympotms persistent after 1 week, let me know    Your left shoulder pain may be referred from your GI tract You need a GB ultrasound    Referral to our psychologist,  And trial of generic Pristiq

## 2017-12-27 ENCOUNTER — Telehealth: Payer: Self-pay | Admitting: Internal Medicine

## 2017-12-27 DIAGNOSIS — R1013 Epigastric pain: Secondary | ICD-10-CM

## 2017-12-27 NOTE — Telephone Encounter (Signed)
Centralized scheduling stated the reason for pt exam needs to be corrected on pt order for US abdomen. Please and Thank you!

## 2017-12-29 ENCOUNTER — Encounter: Payer: Self-pay | Admitting: Internal Medicine

## 2017-12-29 DIAGNOSIS — M25512 Pain in left shoulder: Secondary | ICD-10-CM | POA: Insufficient documentation

## 2017-12-29 DIAGNOSIS — F411 Generalized anxiety disorder: Secondary | ICD-10-CM | POA: Insufficient documentation

## 2017-12-29 NOTE — Assessment & Plan Note (Signed)
I have congratulated her in reduction of   BMI and encouraged  Continued weight loss with goal of 10% of body weigh over the next 6 months using a low glycemic index diet and regular exercise a minimum of 5 days per week. Continue Saxenda

## 2017-12-29 NOTE — Assessment & Plan Note (Signed)
Referred from GI suspected givne tempral relationship with eating.  abd ultrasound ordered.Lipase and LFts are normal .  Lab Results  Component Value Date   LIPASE 17.0 12/26/2017   Lab Results  Component Value Date   ALT 29 12/26/2017   AST 21 12/26/2017   ALKPHOS 44 12/26/2017   BILITOT 1.2 12/26/2017

## 2017-12-29 NOTE — Assessment & Plan Note (Signed)
Well controlled on current regimen. Renal function stable, no changes today.  Lab Results  Component Value Date   CREATININE 0.92 12/26/2017   Lab Results  Component Value Date   NA 138 12/26/2017   K 3.8 12/26/2017   CL 100 12/26/2017   CO2 33 (H) 12/26/2017

## 2017-12-29 NOTE — Assessment & Plan Note (Signed)
Secondary to marital conflict, now with impending separation vs divorce.  Trial of Pristiq ,  Referral to [place] for marriage counselling

## 2017-12-31 NOTE — Telephone Encounter (Signed)
Copied from [place]. Topic: General - Other >> Dec 31, 2017 10:29 AM Bea Graff, NT wrote: Reason for CRM: Pt calling [facility] back. States that the voicemail left said her [place] was on 2/18 but the appt is scheduled on 2/13 and pt cannot do the 13th. Please call pt to r/s appt.

## 2018-01-02 ENCOUNTER — Ambulatory Visit: Payer: BC Managed Care – PPO

## 2018-01-07 ENCOUNTER — Ambulatory Visit
Admission: RE | Admit: 2018-01-07 | Discharge: 2018-01-07 | Disposition: A | Discharge status: Home / Self Care | Payer: BC Managed Care – PPO | Source: Ambulatory Visit | Attending: Internal Medicine | Admitting: Internal Medicine

## 2018-01-07 DIAGNOSIS — M25512 Pain in left shoulder: Secondary | ICD-10-CM | POA: Diagnosis not present

## 2018-01-07 DIAGNOSIS — R932 Abnormal findings on diagnostic imaging of liver and biliary tract: Secondary | ICD-10-CM | POA: Diagnosis not present

## 2018-01-07 DIAGNOSIS — R1013 Epigastric pain: Secondary | ICD-10-CM | POA: Diagnosis not present

## 2018-01-09 ENCOUNTER — Encounter: Payer: Self-pay | Admitting: Internal Medicine

## 2018-01-09 ENCOUNTER — Other Ambulatory Visit: Payer: Self-pay | Admitting: Internal Medicine

## 2018-01-09 DIAGNOSIS — K8689 Other specified diseases of pancreas: Secondary | ICD-10-CM

## 2018-01-09 DIAGNOSIS — R17 Unspecified jaundice: Secondary | ICD-10-CM

## 2018-01-09 DIAGNOSIS — K760 Fatty (change of) liver, not elsewhere classified: Secondary | ICD-10-CM

## 2018-01-09 DIAGNOSIS — R1013 Epigastric pain: Secondary | ICD-10-CM

## 2018-01-15 ENCOUNTER — Encounter: Payer: Self-pay | Admitting: Gastroenterology

## 2018-01-15 ENCOUNTER — Ambulatory Visit: Payer: BC Managed Care – PPO | Admitting: Gastroenterology

## 2018-01-15 VITALS — BP 130/83 | HR 78 | Temp 98.1°F | Ht 66.0 in | Wt 222.2 lb

## 2018-01-15 DIAGNOSIS — K219 Gastro-esophageal reflux disease without esophagitis: Secondary | ICD-10-CM

## 2018-01-15 NOTE — Patient Instructions (Signed)
You were given samples of Dexilant 60mg  today. If you feel improvement in your symptoms with this medication, please contact our office to request a prescription.

## 2018-01-15 NOTE — Progress Notes (Signed)
Gastroenterology Consultation  Referring Provider:     Crecencio Mc, MD Primary Care Physician:  Crecencio Mc, MD Primary Gastroenterologist:  Dr. Allen Norris     Reason for Consultation:     Left-sided chest pain and fatty liver        HPI:   Laura [name] is a 47 y.o. y/o female referred for consultation & management of Left-sided chest pain and fatty liver by Dr. Derrel Nip, Aris Everts, MD.  This patient comes in today with a history of pain that is in her left chest right below her clavicle when she eats.  The patient was sent for a right upper quadrant ultrasound due to this symptom and was found to have a fatty liver and a dilated pancreatic duct.  The patient states that her symptoms are usually associated with eating.  She also states that she does not abuse alcohol.  The patient reports that she has been having long-standing heartburn for which she was given Pepcid with very little relief of her heartburn.  The patient has also reported that she has lost 32 pounds in the last year with dieting.  The patient denies any change of bowel habits she also denies any black stools or bloody stools.  There is no report of any diabetes. The pain in her left side of her chest can come with all different types of food including popcorn and spaghetti sauces.  She also reports that it happened yesterday after drinking a [facility].  Past Medical History:  Diagnosis Date  . Allergy   . Dysplasia of cervix   . Herpes genitalia   . Hypertension    HX of high readings   . Increased BMI   . Menstrual migraine     Past Surgical History:  Procedure Laterality Date  . DILATION AND CURETTAGE OF UTERUS  2014 FEB and August  . MOUTH SURGERY    . TONSILECTOMY/ADENOIDECTOMY WITH MYRINGOTOMY Bilateral 2011    Prior to Admission medications   Medication Sig Start Date End Date Taking? Authorizing Provider  ALPRAZolam Duanne Moron) 0.5 MG tablet Take 1 tablet (0.5 mg total) by mouth at bedtime as needed for  anxiety. 05/28/17  Yes Crecencio Mc, MD  cyanocobalamin (,VITAMIN B-12,) 1000 MCG/ML injection Inject 1 mL (1,000 mcg total) into the muscle every 30 (thirty) days. 02/21/17  Yes Crecencio Mc, MD  desvenlafaxine (PRISTIQ) 50 MG 24 hr tablet Take 1 tablet (50 mg total) by mouth daily. 12/26/17  Yes Crecencio Mc, MD  famotidine (PEPCID) 20 MG tablet Take 1 tablet (20 mg total) by mouth 2 (two) times daily. 12/26/17  Yes Crecencio Mc, MD  levonorgestrel (MIRENA) 20 MCG/24HR IUD 1 each by Intrauterine route once.   Yes [provider]  Liraglutide -Weight Management (SAXENDA) 18 MG/3ML SOPN Inject 3 mg daily into the skin. 10/01/17  Yes Crecencio Mc, MD  losartan-hydrochlorothiazide (HYZAAR) 50-12.5 MG tablet Take 1 tablet by mouth daily. 10/10/17  Yes Crecencio Mc, MD  SYRINGE-NEEDLE, DISP, 3 ML 25G X 1" 3 ML MISC Use for b12 injections 11/24/16  Yes Crecencio Mc, MD    Family History  Problem Relation Age of Onset  . Arthritis Mother   . Hypertension Mother   . Diabetes Mother   . Heart disease Father   . Hyperlipidemia Father   . Hypertension Father   . Cancer Neg Hx   . Breast cancer Neg Hx      Social  History   Tobacco Use  . Smoking status: Never Smoker  . Smokeless tobacco: Never Used  Substance Use Topics  . Alcohol use: Yes    Comment: occasioally  . Drug use: No    Allergies as of 01/15/2018  . (No Known Allergies)    Review of Systems:    All systems reviewed and negative except where noted in HPI.   Physical Exam:  BP 130/83   Pulse 78   Temp 98.1 F (36.7 C) (Oral)   Ht 5\' 6"  (1.676 m)   Wt 222 lb 3.2 oz (100.8 kg)   BMI 35.86 kg/m  No LMP recorded. Patient is not currently having periods (Reason: IUD). Psych:  Alert and cooperative. Normal mood and affect. General:   Alert,  Well-developed, well-nourished, pleasant and cooperative in NAD Head:  Normocephalic and atraumatic. Eyes:  Sclera clear, no icterus.   Conjunctiva pink. Ears:   Normal auditory acuity. Nose:  No deformity, discharge, or lesions. Mouth:  No deformity or lesions,oropharynx pink & moist. Neck:  Supple; no masses or thyromegaly. Lungs:  Respirations even and unlabored.  Clear throughout to auscultation.   No wheezes, crackles, or rhonchi. No acute distress. Heart:  Regular rate and rhythm; no murmurs, clicks, rubs, or gallops. Abdomen:  Normal bowel sounds.  No bruits.  Soft, non-tender and non-distended without masses, hepatosplenomegaly or hernias noted.  No guarding or rebound tenderness.  Negative Carnett sign.   Rectal:  Deferred.  Msk:  Symmetrical without gross deformities.  Good, equal movement & strength bilaterally. Pulses:  Normal pulses noted. Extremities:  No clubbing or edema.  No cyanosis. Neurologic:  Alert and oriented x3;  grossly normal neurologically. Skin:  Intact without significant lesions or rashes.  No jaundice. Lymph Nodes:  No significant cervical adenopathy. Psych:  Alert and cooperative. Normal mood and affect.  Imaging Studies: US Abdomen Complete  Result Date: 01/07/2018 CLINICAL DATA:  47 year old female with epigastric pain for 6 months. Initial encounter. EXAM: ABDOMEN ULTRASOUND COMPLETE COMPARISON:  None. FINDINGS: Gallbladder: No gallstones or wall thickening visualized. No sonographic Murphy sign noted by sonographer. Common bile duct: Diameter: 2 mm Liver: Liver of increased echogenicity consistent with fatty infiltration and/or hepatocellular disease. No dominant mass or intrahepatic biliary duct dilation portal vein is patent on color Doppler imaging with normal direction of blood flow towards the liver. IVC: No abnormality visualized. Pancreas: Not well visualized secondary to bowel gas. Pancreatic duct appears enlarged measuring 4.5 mm. Spleen: Size and appearance within normal limits. Right Kidney: Length: 10.8 cm. Echogenicity within normal limits. No mass or hydronephrosis visualized. Left Kidney: Length: 10.7 cm.  Echogenicity within normal limits. No mass or hydronephrosis visualized. Abdominal aorta: Suboptimally evaluated secondary to habitus and bowel gas. Portions visualized unremarkable. Other findings: None. IMPRESSION: Liver of increased echogenicity consistent with fatty infiltration and/or hepatocellular disease. No dominant liver lesion noted. Gallbladder unremarkable. Pancreas not well visualized secondary to bowel gas. Pancreatic duct appears prominent measuring 4.5 mm. If further delineation of pancreas is clinically desired, CT or MR will be necessary. Electronically Signed   By: Genia Del M.D.   On: 01/07/2018 18:43    Assessment and Plan:   Jacquilyn Seldon is a 47 y.o. y/o female who has a history of left-sided chest pain with eating.  She also reports that she has reflux that has not been helped by Pepcid. The patient was also found to have a dilated pancreatic duct on ultrasound and is set up for a MRCP this  week.  The patient will be started on a trial of Dexilant to see if this helps her chest pain and reflux symptoms.  The patient has been told of the risks of chronic heartburn and acid damage and the risks and benefits of taking a PPI.  The patient will contact me if her symptoms do not improve.  Lucilla Lame, MD. Marval Regal   Note: This dictation was prepared with Dragon dictation along with smaller phrase technology. Any transcriptional errors that result from this process are unintentional.

## 2018-01-17 ENCOUNTER — Ambulatory Visit
Admission: RE | Admit: 2018-01-17 | Discharge: 2018-01-17 | Disposition: A | Discharge status: Home / Self Care | Payer: BC Managed Care – PPO | Source: Ambulatory Visit | Attending: Internal Medicine | Admitting: Internal Medicine

## 2018-01-17 DIAGNOSIS — R1013 Epigastric pain: Secondary | ICD-10-CM | POA: Insufficient documentation

## 2018-01-17 DIAGNOSIS — K760 Fatty (change of) liver, not elsewhere classified: Secondary | ICD-10-CM | POA: Diagnosis present

## 2018-01-17 DIAGNOSIS — K8689 Other specified diseases of pancreas: Secondary | ICD-10-CM | POA: Diagnosis present

## 2018-01-17 MED ORDER — GADOBENATE DIMEGLUMINE 529 MG/ML IV SOLN
20.0000 mL | Freq: Once | INTRAVENOUS | Status: AC | PRN
  Administered 2018-01-17: 20 mL via INTRAVENOUS

## 2018-01-21 ENCOUNTER — Encounter: Payer: Self-pay | Admitting: Internal Medicine

## 2018-01-21 DIAGNOSIS — K760 Fatty (change of) liver, not elsewhere classified: Secondary | ICD-10-CM | POA: Insufficient documentation

## 2018-01-22 ENCOUNTER — Other Ambulatory Visit: Payer: Self-pay | Admitting: Internal Medicine

## 2018-01-30 ENCOUNTER — Encounter: Payer: Self-pay | Admitting: Gastroenterology

## 2018-04-01 ENCOUNTER — Encounter: Payer: Self-pay | Admitting: Internal Medicine

## 2018-04-01 ENCOUNTER — Ambulatory Visit: Payer: BC Managed Care – PPO | Admitting: Internal Medicine

## 2018-04-01 VITALS — BP 120/84 | HR 72 | Temp 98.4°F | Resp 15 | Ht 66.0 in | Wt 225.6 lb

## 2018-04-01 DIAGNOSIS — M25512 Pain in left shoulder: Secondary | ICD-10-CM

## 2018-04-01 DIAGNOSIS — E559 Vitamin D deficiency, unspecified: Secondary | ICD-10-CM

## 2018-04-01 DIAGNOSIS — K760 Fatty (change of) liver, not elsewhere classified: Secondary | ICD-10-CM

## 2018-04-01 DIAGNOSIS — D582 Other hemoglobinopathies: Secondary | ICD-10-CM | POA: Diagnosis not present

## 2018-04-01 DIAGNOSIS — F411 Generalized anxiety disorder: Secondary | ICD-10-CM

## 2018-04-01 DIAGNOSIS — Z79899 Other long term (current) drug therapy: Secondary | ICD-10-CM | POA: Diagnosis not present

## 2018-04-01 DIAGNOSIS — R5383 Other fatigue: Secondary | ICD-10-CM

## 2018-04-01 DIAGNOSIS — R7301 Impaired fasting glucose: Secondary | ICD-10-CM

## 2018-04-01 LAB — COMPREHENSIVE METABOLIC PANEL
ALT: 29 U/L (ref 0–35)
AST: 22 U/L (ref 0–37)
Albumin: 3.6 g/dL (ref 3.5–5.2)
Alkaline Phosphatase: 51 U/L (ref 39–117)
BILIRUBIN TOTAL: 0.7 mg/dL (ref 0.2–1.2)
BUN: 10 mg/dL (ref 6–23)
CO2: 32 mEq/L (ref 19–32)
Calcium: 8.9 mg/dL (ref 8.4–10.5)
Chloride: 100 mEq/L (ref 96–112)
Creatinine, Ser: 0.83 mg/dL (ref 0.40–1.20)
GFR: 78.23 mL/min (ref >60.00)
GLUCOSE: 89 mg/dL (ref 70–99)
Potassium: 3.8 mEq/L (ref 3.5–5.1)
Sodium: 140 mEq/L (ref 135–145)
Total Protein: 6.6 g/dL (ref 6.0–8.3)

## 2018-04-01 LAB — VITAMIN D 25 HYDROXY (VIT D DEFICIENCY, FRACTURES): VITD: 35.42 ng/mL (ref 30.00–100.00)

## 2018-04-01 LAB — HEMOGLOBIN A1C: HEMOGLOBIN A1C: 5.3 % (ref 4.6–6.5)

## 2018-04-01 LAB — VITAMIN B12: Vitamin B-12: 406 pg/mL (ref 211–911)

## 2018-04-01 LAB — TSH: TSH: 2.26 u[IU]/mL (ref 0.35–4.50)

## 2018-04-01 MED ORDER — LIRAGLUTIDE -WEIGHT MANAGEMENT 18 MG/3ML ~~LOC~~ SOPN: 3.0000 mg | PEN_INJECTOR | Freq: Every day | SUBCUTANEOUS | 5 refills | Status: DC

## 2018-04-01 MED ORDER — ALPRAZOLAM 0.5 MG PO TABS: 0.5000 mg | ORAL_TABLET | Freq: Every evening | ORAL | 3 refills | Status: DC | PRN

## 2018-04-01 MED ORDER — DESVENLAFAXINE SUCCINATE ER 50 MG PO TB24: 50.0000 mg | ORAL_TABLET | Freq: Every day | ORAL | 3 refills | Status: DC

## 2018-04-01 NOTE — Progress Notes (Signed)
Subjective:  Patient ID: Laura [name], female    DOB: [birth year]  Age: 47 y.o. MRN: [id number]  CC: The primary encounter diagnosis was Long-term use of high-risk medication. Diagnoses of Impaired fasting glucose, Fatigue, unspecified type, Vitamin D deficiency, Left shoulder pain, unspecified chronicity, Hepatic steatosis, GAD (generalized anxiety disorder), and Elevated hemoglobin (HCC) were also pertinent to this visit. Lab Results  Component Value Date   HGBA1C 5.3 04/01/2018    HPI [name] presents for follow up on anxiety triggered by marital discord.  Since her last visit she has confronted her spouse and he has been has been making effort to improve his communication with her.  She reports that the conflicts are resolving and  things are much better at home .  She is  Tolerating pristiq with more even moods,  Less irritability,  Using alprazolam  prn    2) obesity management with saxenda . Weight in jan 2018 was 259. Nadir was 222 in Feb 2019 tolerating medication.    3) Saw GI for GERD and lpost prandial eft shoulder pain for  workup which included imaging with MRCP to rule out pancreatic and GB  Issues. No obstructive issues identified. Was given a trial of Dexilant.  Felt it did not work any better than the other PPIs.       Lab Results  Component Value Date   TSH 2.26 04/01/2018      Outpatient Medications Prior to Visit  Medication Sig Dispense Refill  . cyanocobalamin (,VITAMIN B-12,) 1000 MCG/ML injection Inject 1 mL (1,000 mcg total) into the muscle every 30 (thirty) days. 10 mL 1  . esomeprazole (NEXIUM) 20 MG capsule Take 20 mg by mouth daily at 12 noon.    Marland Kitchen levonorgestrel (MIRENA) 20 MCG/24HR IUD 1 each by Intrauterine route once.    Marland Kitchen losartan-hydrochlorothiazide (HYZAAR) 50-12.5 MG tablet Take 1 tablet by mouth daily. 90 tablet 3  . SYRINGE-NEEDLE, DISP, 3 ML 25G X 1" 3 ML MISC Use for b12 injections 50 each 0  . ALPRAZolam (XANAX) 0.5 MG tablet Take  1 tablet (0.5 mg total) by mouth at bedtime as needed for anxiety. 30 tablet 3  . desvenlafaxine (PRISTIQ) 50 MG 24 hr tablet Take 1 tablet (50 mg total) by mouth daily. 30 tablet 3  . Liraglutide -Weight Management (SAXENDA) 18 MG/3ML SOPN Inject 3 mg daily into the skin. 9 mL 5  . esomeprazole (NEXIUM) 40 MG capsule Take 40 mg by mouth daily at 12 noon.    . famotidine (PEPCID) 20 MG tablet TAKE 1 TABLET BY MOUTH TWICE A DAY (Patient not taking: Reported on 04/01/2018) 180 tablet 1   No facility-administered medications prior to visit.     Review of Systems;  Patient denies headache, fevers, malaise, unintentional weight loss, skin rash, eye pain, sinus congestion and sinus pain, sore throat, dysphagia,  hemoptysis , cough, dyspnea, wheezing, chest pain, palpitations, orthopnea, edema, abdominal pain, nausea, melena, diarrhea, constipation, flank pain, dysuria, hematuria, urinary  Frequency, nocturia, numbness, tingling, seizures,  Focal weakness, Loss of consciousness,  Tremor, insomnia, depression, anxiety, and suicidal ideation.      Objective:  BP 120/84 (BP Location: Left Arm, Patient Position: Sitting, Cuff Size: Large)   Pulse 72   Temp 98.4 F (36.9 C) (Oral)   Resp 15   Ht 5\' 6"  (1.676 m)   Wt 225 lb 9.6 oz (102.3 kg)   SpO2 97%   BMI 36.41 kg/m   BP Readings from  Last 3 Encounters:  04/01/18 120/84  01/15/18 130/83  12/26/17 110/78    Wt Readings from Last 3 Encounters:  04/01/18 225 lb 9.6 oz (102.3 kg)  01/15/18 222 lb 3.2 oz (100.8 kg)  12/26/17 223 lb 12.8 oz (101.5 kg)    General appearance: alert, cooperative and appears stated age Ears: normal TM's and external ear canals both ears Throat: lips, mucosa, and tongue normal; teeth and gums normal Neck: no adenopathy, no carotid bruit, supple, symmetrical, trachea midline and thyroid not enlarged, symmetric, no tenderness/mass/nodules Back: symmetric, no curvature. ROM normal. No CVA tenderness. Lungs: clear  to auscultation bilaterally Heart: regular rate and rhythm, S1, S2 normal, no murmur, click, rub or gallop Abdomen: soft, non-tender; bowel sounds normal; no masses,  no organomegaly Pulses: 2+ and symmetric Skin: Skin color, texture, turgor normal. No rashes or lesions Lymph nodes: Cervical, supraclavicular, and axillary nodes normal.  Lab Results  Component Value Date   HGBA1C 5.3 04/01/2018   HGBA1C 5.6 11/22/2016    Lab Results  Component Value Date   CREATININE 0.83 04/01/2018   CREATININE 0.92 12/26/2017   CREATININE 0.81 10/01/2017    Lab Results  Component Value Date   WBC 5.7 05/28/2017   HGB 15.4 (H) 05/28/2017   HCT 44.9 05/28/2017   PLT 270.0 05/28/2017   GLUCOSE 89 04/01/2018   CHOL 162 11/22/2016   TRIG 139.0 11/22/2016   HDL 57.00 11/22/2016   LDLDIRECT 59.0 11/22/2016   LDLCALC 77 11/22/2016   ALT 29 04/01/2018   AST 22 04/01/2018   NA 140 04/01/2018   K 3.8 04/01/2018   CL 100 04/01/2018   CREATININE 0.83 04/01/2018   BUN 10 04/01/2018   CO2 32 04/01/2018   TSH 2.26 04/01/2018   HGBA1C 5.3 04/01/2018   MICROALBUR 0.9 11/22/2016    Mr Abdomen Mrcp W Wo Contast  Result Date: 01/17/2018 CLINICAL DATA:  Abdominal pain. Fever. Dilated pancreatic duct on ultrasound. EXAM: MRI ABDOMEN WITHOUT AND WITH CONTRAST (INCLUDING MRCP) TECHNIQUE: Multiplanar multisequence MR imaging of the abdomen was performed both before and after the administration of intravenous contrast. Heavily T2-weighted images of the biliary and pancreatic ducts were obtained, and three-dimensional MRCP images were rendered by post processing. CONTRAST:  68mL MULTIHANCE GADOBENATE DIMEGLUMINE 529 MG/ML IV SOLN COMPARISON:  Ultrasound 01/07/2018. FINDINGS: Lower chest: No acute findings. Hepatobiliary: Mild hepatic steatosis. Following the IV administration of contrast material no enhancing liver abnormalities visualized. The gallbladder appears within normal limits. There is no biliary  dilatation. No choledocholithiasis or mass noted. Pancreas: No pancreatic ductal dilatation, mass or inflammation identified. Spleen:  The spleen is normal. Adrenals/Urinary Tract: The adrenal glands are unremarkable. Cyst arising from posterior cortex of the upper pole right kidney measures 1 cm. No mass or hydronephrosis identified. Stomach/Bowel: Visualized portions within the abdomen are unremarkable. Vascular/Lymphatic: Normal appearance of the abdominal aorta. No enlarged upper abdominal lymph nodes identified. Other:  None. Musculoskeletal: No suspicious bone lesions identified. IMPRESSION: 1. No acute findings identified within the abdomen. 2. Mild hepatic steatosis. Electronically Signed   By: Kerby Moors M.D.   On: 01/17/2018 09:25    Assessment & Plan:   Problem List Items Addressed This Visit    Fatigue   Relevant Orders   TSH (Completed)   Vitamin B12 (Completed)   Long-term use of high-risk medication - Primary   Relevant Orders   Comprehensive metabolic panel (Completed)   Left shoulder pain    GI workup negative for GB and pancreatic disease.  Hepatic steatosis    Hepatic enzymes are normalized and she has no signs of cirrhosis or synthetic dysfunction .  continue low glycemic index diet, weight loss with goal BMI < 30 , which he has done, and management of obesity.  Advised to consider Hep A/B vaccination series   Lab Results  Component Value Date   ALT 29 04/01/2018   AST 22 04/01/2018   ALKPHOS 51 04/01/2018   BILITOT 0.7 04/01/2018         GAD (generalized anxiety disorder)    Improved with Pristiq.  No weight gain.  Refills given       Relevant Medications   desvenlafaxine (PRISTIQ) 50 MG 24 hr tablet   ALPRAZolam (XANAX) 0.5 MG tablet   Elevated hemoglobin (HCC)    Mild, noticed in July 2018.  Iron studies normal.  Repeat next visit       Relevant Orders   CBC with Differential/Platelet    Other Visit Diagnoses    Impaired fasting glucose        Relevant Orders   Hemoglobin A1c (Completed)   Vitamin D deficiency       Relevant Orders   VITAMIN D 25 Hydroxy (Vit-D Deficiency, Fractures) (Completed)      I have discontinued Reeta A. Muldoon "Andrea"'s famotidine. I have also changed her Liraglutide -Weight Management. Additionally, I am having her maintain her levonorgestrel, SYRINGE-NEEDLE (DISP) 3 ML, cyanocobalamin, losartan-hydrochlorothiazide, esomeprazole, desvenlafaxine, and ALPRAZolam.  Meds ordered this encounter  Medications  . desvenlafaxine (PRISTIQ) 50 MG 24 hr tablet    Sig: Take 1 tablet (50 mg total) by mouth daily.    Dispense:  30 tablet    Refill:  3  . ALPRAZolam (XANAX) 0.5 MG tablet    Sig: Take 1 tablet (0.5 mg total) by mouth at bedtime as needed for anxiety.    Dispense:  30 tablet    Refill:  3  . Liraglutide -Weight Management (SAXENDA) 18 MG/3ML SOPN    Sig: Inject 3 mg into the skin daily.    Dispense:  9 mL    Refill:  5    A total of 25 minutes of face to face time was spent with patient more than half of which was spent in counselling about the above mentioned conditions  and coordination of care   Medications Discontinued During This Encounter  Medication Reason  . famotidine (PEPCID) 20 MG tablet Change in therapy  . esomeprazole (NEXIUM) 40 MG capsule Error  . desvenlafaxine (PRISTIQ) 50 MG 24 hr tablet Reorder  . ALPRAZolam (XANAX) 0.5 MG tablet Reorder  . Liraglutide -Weight Management (SAXENDA) 18 MG/3ML SOPN Reorder    Follow-up: Return in about 6 months (around 10/02/2018).   Crecencio Mc, MD

## 2018-04-01 NOTE — Patient Instructions (Signed)
I'm glad things are better  at home!  I 'll see you in 6 months

## 2018-04-02 NOTE — Assessment & Plan Note (Signed)
Mild, noticed in July 2018.  Iron studies normal.  Repeat next visit

## 2018-04-02 NOTE — Assessment & Plan Note (Signed)
Improved with Pristiq.  No weight gain.  Refills given  

## 2018-04-02 NOTE — Assessment & Plan Note (Signed)
GI workup negative for GB and pancreatic disease.

## 2018-04-02 NOTE — Assessment & Plan Note (Addendum)
Hepatic enzymes are normalized and she has no signs of cirrhosis or synthetic dysfunction .  continue low glycemic index diet, weight loss with goal BMI < 30 , which he has done, and management of obesity.  Advised to consider Hep A/B vaccination series   Lab Results  Component Value Date   ALT 29 04/01/2018   AST 22 04/01/2018   ALKPHOS 51 04/01/2018   BILITOT 0.7 04/01/2018

## 2018-05-03 ENCOUNTER — Telehealth: Payer: Self-pay

## 2018-05-03 NOTE — Telephone Encounter (Signed)
PA for Saxenda has been submitted on covermymeds.  

## 2018-05-04 ENCOUNTER — Encounter: Payer: Self-pay | Admitting: Obstetrics and Gynecology

## 2018-05-06 ENCOUNTER — Encounter: Payer: Self-pay | Admitting: Internal Medicine

## 2018-05-07 ENCOUNTER — Encounter: Payer: Self-pay | Admitting: Internal Medicine

## 2018-05-10 ENCOUNTER — Telehealth: Payer: Self-pay | Admitting: Internal Medicine

## 2018-05-10 NOTE — Telephone Encounter (Signed)
Copied from [place] [id number]. Topic: Quick Communication - Rx Refill/Question >> May 10, 2018 12:48 PM Margot Ables wrote: Medication: Liraglutide -Weight Management ([place]) 18 MG/3ML SOPN - sent over appeal information and calling to confirm it was received and notify that a letter of consent from the patient is required as well to file the appeal REF #: [id number]

## 2018-05-15 ENCOUNTER — Encounter: Payer: Self-pay | Admitting: Obstetrics and Gynecology

## 2018-05-15 ENCOUNTER — Ambulatory Visit (INDEPENDENT_AMBULATORY_CARE_PROVIDER_SITE_OTHER): Payer: BC Managed Care – PPO | Admitting: Obstetrics and Gynecology

## 2018-05-15 VITALS — BP 130/75 | HR 73 | Ht 66.0 in | Wt 226.8 lb

## 2018-05-15 DIAGNOSIS — Z30433 Encounter for removal and reinsertion of intrauterine contraceptive device: Secondary | ICD-10-CM | POA: Diagnosis not present

## 2018-05-15 NOTE — Progress Notes (Signed)
Chief complaint: 1.  IUD removal/reinsertion  OBJECTIVE: BP 130/75   Pulse 73   Ht 5\' 6"  (1.676 m)   Wt 226 lb 12.8 oz (102.9 kg)   LMP  (LMP Unknown)   BMI 36.61 kg/m   Pleasant well-appearing female no acute distress.  Alert and oriented.  Affect is appropriate. Back: No CVA tenderness Abdomen: Soft, nontender without organomegaly Pelvic exam: External genitalia-normal BUS-normal Vagina-normal estrogen effect; no discharge Cervix-no lesions; no cervical motion tenderness; IUD strings visible; IUD is removed with [place] forceps Uterus-midplane, normal size and shape, mobile, nontender  IUD Insertion Procedure Note  Pre-operative Diagnosis: Contraception  Post-operative Diagnosis: same  Indications: contraception  Procedure Details  Urine pregnancy test was not done.  Previous IUD was removed.  The risks (including infection, bleeding, pain, and uterine perforation) and benefits of the procedure were explained to the patient and Verbal informed consent was obtained.    Cervix cleansed with Betadine. Uterus sounded to 8.5 cm. IUD inserted without difficulty. String visible and trimmed to 3 cm length. Patient tolerated procedure well.  IUD Information: Verdis Frederickson, Lot #[id number], Expiration date November 2021.  Condition: Stable  Complications: None  Plan:  The patient was advised to call for any fever or for prolonged or severe pain or bleeding. She was advised to use OTC acetaminophen and OTC ibuprofen as needed for mild to moderate pain.   Attending Physician Documentation: Brayton Mars, MD   ASSESSMENT: 1.  Contraception-Mirena IUD removed, reinserted  PLAN: 1.  Mirena IUD replaced as noted 2.  Return in 4 weeks for string assessment  Brayton Mars, MD  Note: This dictation was prepared with Dragon dictation along with smaller phrase technology. Any transcriptional errors that result from this process are unintentional.

## 2018-05-15 NOTE — Patient Instructions (Signed)
1. Return in 4 weeks for IUD string check   Intrauterine Device Insertion, Care After This sheet gives you information about how to care for yourself after your procedure. Your health care provider may also give you more specific instructions. If you have problems or questions, contact your health care provider. What can I expect after the procedure? After the procedure, it is common to have:  Cramps and pain in the abdomen.  Light bleeding (spotting) or heavier bleeding that is like your menstrual period. This may last for up to a few days.  Lower back pain.  Dizziness.  Headaches.  Nausea. Follow these instructions at home:  Before resuming sexual activity, check to make sure that you can feel the IUD string(s). You should be able to feel the end of the string(s) below the opening of your cervix. If your IUD string is in place, you may resume sexual activity.  If you had a hormonal IUD inserted more than 7 days after your most recent period started, you will need to use a backup method of birth control for 7 days after IUD insertion. Ask your health care provider whether this applies to you.  Continue to check that the IUD is still in place by feeling for the string(s) after every menstrual period, or once a month.  Take over-the-counter and prescription medicines only as told by your health care provider.  Do not drive or use heavy machinery while taking prescription pain medicine.  Keep all follow-up visits as told by your health care provider. This is important. Contact a health care provider if:  You have bleeding that is heavier or lasts longer than a normal menstrual cycle.  You have a fever.  You have cramps or abdominal pain that get worse or do not get better with medicine.  You develop abdominal pain that is new or is not in the same area of earlier cramping and pain.  You feel lightheaded or weak.  You have abnormal or bad-smelling discharge from your  vagina.  You have pain during sexual activity.  You have any of the following problems with your IUD string(s):  The string bothers or hurts you or your sexual partner.  You cannot feel the string.  The string has gotten longer.  You can feel the IUD in your vagina.  You think you may be pregnant, or you miss your menstrual period.  You think you may have an STI (sexually transmitted infection). Get help right away if:  You have flu-like symptoms.  You have a fever and chills.  You can feel that your IUD has slipped out of place. Summary  After the procedure, it is common to have cramps and pain in the abdomen. It is also common to have light bleeding (spotting) or heavier bleeding that is like your menstrual period.  Continue to check that the IUD is still in place by feeling for the string(s) after every menstrual period, or once a month.  Keep all follow-up visits as told by your health care provider. This is important.  Contact your health care provider if you have problems with your IUD string(s), such as the string getting longer or bothering you or your sexual partner. This information is not intended to replace advice given to you by your health care provider. Make sure you discuss any questions you have with your health care provider. Document Released: 07/05/2011 Document Revised: 09/27/2016 Document Reviewed: 09/27/2016 Elsevier Interactive Patient Education  2017 [employer]  

## 2018-06-11 ENCOUNTER — Ambulatory Visit: Payer: BC Managed Care – PPO | Admitting: Internal Medicine

## 2018-06-11 ENCOUNTER — Encounter: Payer: Self-pay | Admitting: Internal Medicine

## 2018-06-11 DIAGNOSIS — E669 Obesity, unspecified: Secondary | ICD-10-CM | POA: Diagnosis not present

## 2018-06-11 DIAGNOSIS — Z8719 Personal history of other diseases of the digestive system: Secondary | ICD-10-CM | POA: Diagnosis not present

## 2018-06-11 MED ORDER — PHENTERMINE HCL 37.5 MG PO TABS: 37.5000 mg | ORAL_TABLET | Freq: Every day | ORAL | 2 refills | Status: DC

## 2018-06-11 MED ORDER — LIDOCAINE VISCOUS HCL 2 % MT SOLN: 15.0000 mL | OROMUCOSAL | 0 refills | Status: DC | PRN

## 2018-06-11 MED ORDER — "SYRINGE 25G X 1"" 3 ML MISC": 0 refills | Status: DC

## 2018-06-11 MED ORDER — CYANOCOBALAMIN 1000 MCG/ML IJ SOLN: 1000.0000 ug | INTRAMUSCULAR | 6 refills | Status: DC

## 2018-06-11 NOTE — Assessment & Plan Note (Signed)
Viscous lidocaine prescribed

## 2018-06-11 NOTE — Patient Instructions (Signed)
viscous lidocaine solution for aphthous ulcers in mouth.  Swish and spit as needed This is just a pain reliever . If they keep coming back,  We can try a week of acyclovir.    Phentermine trial for 3 months  Goal is 12 lbs by retrn visit in 3 months   b12 injections weekly if needed.

## 2018-06-11 NOTE — Progress Notes (Signed)
Subjective:  Patient ID: Laura [name], female    DOB: 1971/07/12  Age: 47 y.o. MRN: [id number]  CC: Diagnoses of Obesity (BMI 35.0-39.9 without comorbidity) and H/O oral aphthous ulcers were pertinent to this visit.  HPI Laura [name] presents for evaluation for inability to lose weight.  Has stopped taking Saxenda due to cost  On or around early July.  Her insurance refused to continue payment since she was not able to sustain a 4% reduction in body weight. She is requesting alternative therapy.  Her weight was  259 prior to starting therapy and her low was 222 in feb,  Now weighs 227 lbs/  Previous success with phentermine. Works third shift so energy level wanes a little.  Uses the company exercise gym on days she works third shift,  MWF .  Diet reviewed   Outpatient Medications Prior to Visit  Medication Sig Dispense Refill  . ALPRAZolam (XANAX) 0.5 MG tablet Take 1 tablet (0.5 mg total) by mouth at bedtime as needed for anxiety. 30 tablet 3  . desvenlafaxine (PRISTIQ) 50 MG 24 hr tablet Take 1 tablet (50 mg total) by mouth daily. 30 tablet 3  . esomeprazole (NEXIUM) 20 MG capsule Take 20 mg by mouth daily at 12 noon.    Marland Kitchen levonorgestrel (MIRENA) 20 MCG/24HR IUD 1 each by Intrauterine route once.    Marland Kitchen losartan-hydrochlorothiazide (HYZAAR) 50-12.5 MG tablet Take 1 tablet by mouth daily. 90 tablet 3  . cyanocobalamin (,VITAMIN B-12,) 1000 MCG/ML injection Inject 1 mL (1,000 mcg total) into the muscle every 30 (thirty) days. (Patient not taking: Reported on 06/11/2018) 10 mL 1  . Liraglutide -Weight Management (SAXENDA) 18 MG/3ML SOPN Inject 3 mg into the skin daily. (Patient not taking: Reported on 06/11/2018) 9 mL 5  . SYRINGE-NEEDLE, DISP, 3 ML 25G X 1" 3 ML MISC Use for b12 injections (Patient not taking: Reported on 06/11/2018) 50 each 0   No facility-administered medications prior to visit.     Review of Systems;  Patient denies headache, fevers, malaise, unintentional weight  loss, skin rash, eye pain, sinus congestion and sinus pain, sore throat, dysphagia,  hemoptysis , cough, dyspnea, wheezing, chest pain, palpitations, orthopnea, edema, abdominal pain, nausea, melena, diarrhea, constipation, flank pain, dysuria, hematuria, urinary  Frequency, nocturia, numbness, tingling, seizures,  Focal weakness, Loss of consciousness,  Tremor, insomnia, depression, anxiety, and suicidal ideation.      Objective:  BP 110/76 (BP Location: Left Arm, Patient Position: Sitting, Cuff Size: Large)   Pulse 77   Temp 98.2 F (36.8 C) (Oral)   Resp 16   Ht 5\' 6"  (1.676 m)   Wt 227 lb (103 kg)   LMP  (LMP Unknown)   SpO2 98%   BMI 36.64 kg/m   BP Readings from Last 3 Encounters:  06/11/18 110/76  05/15/18 130/75  04/01/18 120/84    Wt Readings from Last 3 Encounters:  06/11/18 227 lb (103 kg)  05/15/18 226 lb 12.8 oz (102.9 kg)  04/01/18 225 lb 9.6 oz (102.3 kg)    General appearance: alert, cooperative and appears stated age Ears: normal TM's and external ear canals both ears Throat: lips, mucosa, and tongue normal; teeth and gums normal Neck: no adenopathy, no carotid bruit, supple, symmetrical, trachea midline and thyroid not enlarged, symmetric, no tenderness/mass/nodules Back: symmetric, no curvature. ROM normal. No CVA tenderness. Lungs: clear to auscultation bilaterally Heart: regular rate and rhythm, S1, S2 normal, no murmur, click, rub or gallop Abdomen: soft, non-tender;  bowel sounds normal; no masses,  no organomegaly Pulses: 2+ and symmetric Skin: Skin color, texture, turgor normal. No rashes or lesions Lymph nodes: Cervical, supraclavicular, and axillary nodes normal.  Lab Results  Component Value Date   HGBA1C 5.3 04/01/2018   HGBA1C 5.6 11/22/2016    Lab Results  Component Value Date   CREATININE 0.83 04/01/2018   CREATININE 0.92 12/26/2017   CREATININE 0.81 10/01/2017    Lab Results  Component Value Date   WBC 5.7 05/28/2017   HGB 15.4  (H) 05/28/2017   HCT 44.9 05/28/2017   PLT 270.0 05/28/2017   GLUCOSE 89 04/01/2018   CHOL 162 11/22/2016   TRIG 139.0 11/22/2016   HDL 57.00 11/22/2016   LDLDIRECT 59.0 11/22/2016   LDLCALC 77 11/22/2016   ALT 29 04/01/2018   AST 22 04/01/2018   NA 140 04/01/2018   K 3.8 04/01/2018   CL 100 04/01/2018   CREATININE 0.83 04/01/2018   BUN 10 04/01/2018   CO2 32 04/01/2018   TSH 2.26 04/01/2018   HGBA1C 5.3 04/01/2018   MICROALBUR 0.9 11/22/2016    Mr Abdomen Mrcp W Wo Contast  Result Date: 01/17/2018 CLINICAL DATA:  Abdominal pain. Fever. Dilated pancreatic duct on ultrasound. EXAM: MRI ABDOMEN WITHOUT AND WITH CONTRAST (INCLUDING MRCP) TECHNIQUE: Multiplanar multisequence MR imaging of the abdomen was performed both before and after the administration of intravenous contrast. Heavily T2-weighted images of the biliary and pancreatic ducts were obtained, and three-dimensional MRCP images were rendered by post processing. CONTRAST:  73mL MULTIHANCE GADOBENATE DIMEGLUMINE 529 MG/ML IV SOLN COMPARISON:  Ultrasound 01/07/2018. FINDINGS: Lower chest: No acute findings. Hepatobiliary: Mild hepatic steatosis. Following the IV administration of contrast material no enhancing liver abnormalities visualized. The gallbladder appears within normal limits. There is no biliary dilatation. No choledocholithiasis or mass noted. Pancreas: No pancreatic ductal dilatation, mass or inflammation identified. Spleen:  The spleen is normal. Adrenals/Urinary Tract: The adrenal glands are unremarkable. Cyst arising from posterior cortex of the upper pole right kidney measures 1 cm. No mass or hydronephrosis identified. Stomach/Bowel: Visualized portions within the abdomen are unremarkable. Vascular/Lymphatic: Normal appearance of the abdominal aorta. No enlarged upper abdominal lymph nodes identified. Other:  None. Musculoskeletal: No suspicious bone lesions identified. IMPRESSION: 1. No acute findings identified within  the abdomen. 2. Mild hepatic steatosis. Electronically Signed   By: Kerby Moors M.D.   On: 01/17/2018 09:25    Assessment & Plan:   Problem List Items Addressed This Visit    Obesity (BMI 35.0-39.9 without comorbidity)    She has had difficulty losing weight due to increased appetite and is requesting a trial of  Phentermine.  She is aware of the possible side effects and risks and understands that    The medication will be discontinued if she has not lost 5% of her body weight over the next 3 months, which , based on today's weight is 12lbs.   Strongly advised to increase exercis to 5 dyasper week       Relevant Medications   phentermine (ADIPEX-P) 37.5 MG tablet   H/O oral aphthous ulcers    Viscous lidocaine prescribed         I have discontinued Janeth A. Schubert "Andra"'s SYRINGE-NEEDLE (DISP) 3 ML, cyanocobalamin, and Liraglutide -Weight Management. I am also having her start on cyanocobalamin, SYRINGE 3CC/25GX1", phentermine, and lidocaine. Additionally, I am having her maintain her levonorgestrel, losartan-hydrochlorothiazide, esomeprazole, desvenlafaxine, and ALPRAZolam.  Meds ordered this encounter  Medications  . cyanocobalamin (,VITAMIN B-12,) 1000  MCG/ML injection    Sig: Inject 1 mL (1,000 mcg total) into the muscle once a week.    Dispense:  10 mL    Refill:  6  . Syringe/Needle, Disp, (SYRINGE 3CC/25GX1") 25G X 1" 3 ML MISC    Sig: Use for b12 injections    Dispense:  50 each    Refill:  0  . phentermine (ADIPEX-P) 37.5 MG tablet    Sig: Take 1 tablet (37.5 mg total) by mouth daily before breakfast.    Dispense:  30 tablet    Refill:  2  . lidocaine (XYLOCAINE) 2 % solution    Sig: Use as directed 15 mLs in the mouth or throat as needed for mouth pain.    Dispense:  15 mL    Refill:  0    Medications Discontinued During This Encounter  Medication Reason  . Liraglutide -Weight Management (SAXENDA) 18 MG/3ML SOPN Patient has not taken in last 30 days  .  cyanocobalamin (,VITAMIN B-12,) 1000 MCG/ML injection Patient Preference  . SYRINGE-NEEDLE, DISP, 3 ML 25G X 1" 3 ML MISC Patient Preference    Follow-up: Return in about 3 months (around 09/12/2018) for weight management .   Crecencio Mc, MD

## 2018-06-11 NOTE — Assessment & Plan Note (Addendum)
She has had difficulty losing weight due to increased appetite and is requesting a trial of  Phentermine.  She is aware of the possible side effects and risks and understands that    The medication will be discontinued if she has not lost 5% of her body weight over the next 3 months, which , based on today's weight is 12lbs.   Strongly advised to increase exercis to 5 dyasper week

## 2018-06-13 ENCOUNTER — Ambulatory Visit (INDEPENDENT_AMBULATORY_CARE_PROVIDER_SITE_OTHER): Payer: BC Managed Care – PPO | Admitting: Obstetrics and Gynecology

## 2018-06-13 ENCOUNTER — Encounter: Payer: Self-pay | Admitting: Obstetrics and Gynecology

## 2018-06-13 VITALS — BP 129/70 | HR 72 | Ht 66.0 in | Wt 227.5 lb

## 2018-06-13 DIAGNOSIS — Z975 Presence of (intrauterine) contraceptive device: Secondary | ICD-10-CM

## 2018-06-13 NOTE — Progress Notes (Signed)
Pt stated that she is doing well no complaints.  

## 2018-06-13 NOTE — Patient Instructions (Signed)
1.  Return in November 2019 for annual exam

## 2018-06-13 NOTE — Progress Notes (Signed)
GYN ENCOUNTER NOTE  Subjective:       Laura [name] is a 47 y.o. G49P0020 female is here for gynecologic evaluation of the following issues:  1.  IUD string check  Patient reports mild spotting since insertion.  She is not experiencing any significant pelvic pain or cramping. She denies fevers chills or sweats.  She denies UTI symptoms.  She denies pelvic pain.  Bowel function is normal.   Gynecologic History No LMP recorded (lmp unknown). (Menstrual status: IUD). Contraception: IUD  Obstetric History OB History  Gravida Para Term Preterm AB Living  2       2    SAB TAB Ectopic Multiple Live Births  0            # Outcome Date GA Lbr Len/2nd Weight Sex Delivery Anes PTL Lv  2 AB              Complications: Missed abortion  1 AB              Complications: Missed abortion    Past Medical History:  Diagnosis Date  . Allergy   . Dysplasia of cervix   . Herpes genitalia   . Hypertension    HX of high readings   . Increased BMI   . Menstrual migraine     Past Surgical History:  Procedure Laterality Date  . DILATION AND CURETTAGE OF UTERUS  2014 FEB and August  . MOUTH SURGERY    . TONSILECTOMY/ADENOIDECTOMY WITH MYRINGOTOMY Bilateral 2011    Current Outpatient Medications on File Prior to Visit  Medication Sig Dispense Refill  . ALPRAZolam (XANAX) 0.5 MG tablet Take 1 tablet (0.5 mg total) by mouth at bedtime as needed for anxiety. 30 tablet 3  . cyanocobalamin (,VITAMIN B-12,) 1000 MCG/ML injection Inject 1 mL (1,000 mcg total) into the muscle once a week. 10 mL 6  . desvenlafaxine (PRISTIQ) 50 MG 24 hr tablet Take 1 tablet (50 mg total) by mouth daily. 30 tablet 3  . esomeprazole (NEXIUM) 20 MG capsule Take 20 mg by mouth daily at 12 noon.    Marland Kitchen levonorgestrel (MIRENA) 20 MCG/24HR IUD 1 each by Intrauterine route once.    . lidocaine (XYLOCAINE) 2 % solution Use as directed 15 mLs in the mouth or throat as needed for mouth pain. 15 mL 0  .  losartan-hydrochlorothiazide (HYZAAR) 50-12.5 MG tablet Take 1 tablet by mouth daily. 90 tablet 3  . phentermine (ADIPEX-P) 37.5 MG tablet Take 1 tablet (37.5 mg total) by mouth daily before breakfast. 30 tablet 2  . Syringe/Needle, Disp, (SYRINGE 3CC/25GX1") 25G X 1" 3 ML MISC Use for b12 injections 50 each 0   No current facility-administered medications on file prior to visit.     Allergies  Allergen Reactions  . Bee Venom     Social History   Socioeconomic History  . Marital status: Married    Spouse name: Not on file  . Number of children: Not on file  . Years of education: Not on file  . Highest education level: Not on file  Occupational History  . Occupation: Solicitor: [employer]  . Financial resource strain: Not on file  . Food insecurity:    Worry: Not on file    Inability: Not on file  . Transportation needs:    Medical: Not on file    Non-medical: Not on file  Tobacco Use  . Smoking status: Never Smoker  .  Smokeless tobacco: Never Used  Substance and Sexual Activity  . Alcohol use: Yes    Comment: occasioally  . Drug use: No  . Sexual activity: Yes    Birth control/protection: IUD  Lifestyle  . Physical activity:    Days per week: 2 days    Minutes per session: 30 min  . Stress: Not at all  Relationships  . Social connections:    Talks on phone: Not on file    Gets together: Not on file    Attends religious service: Not on file    Active member of club or organization: Not on file    Attends meetings of clubs or organizations: Not on file    Relationship status: Not on file  . Intimate partner violence:    Fear of current or ex partner: No    Emotionally abused: No    Physically abused: No    Forced sexual activity: No  Other Topics Concern  . Not on file  Social History Narrative  . Not on file    Family History  Problem Relation Age of Onset  . Arthritis Mother   . Hypertension Mother   . Diabetes Mother    . Heart disease Father   . Hyperlipidemia Father   . Hypertension Father   . Cancer Neg Hx   . Breast cancer Neg Hx     The following portions of the patient's history were reviewed and updated as appropriate: allergies, current medications, past family history, past medical history, past social history, past surgical history and problem list.  Review of Systems Review of Systems -complete review of systems is negative including that  as noted in the HPI  Objective:   BP 129/70   Pulse 72   Ht 5\' 6"  (1.676 m)   Wt 227 lb 8 oz (103.2 kg)   LMP  (LMP Unknown)   BMI 36.72 kg/m  CONSTITUTIONAL: Well-developed, well-nourished female in no acute distress.  HENT:  Normocephalic, atraumatic.  NECK: Not examined SKIN: Skin is warm and dry. No rash noted. Not diaphoretic. No erythema. No pallor. [place]: Alert and oriented to person, place, and time. PSYCHIATRIC: Normal mood and affect. Normal behavior. Normal judgment and thought content. CARDIOVASCULAR:Not Examined RESPIRATORY: Not Examined BREASTS: Not Examined ABDOMEN: Soft, non distended; Non tender.  No Organomegaly. PELVIC:  External Genitalia: Normal  BUS: Normal  Vagina: Normal estrogen effect; no discharge; IUD strings 3 cm within vaginal vault  Cervix: Normal; no cervical motion tenderness  Uterus: Normal size, shape,consistency, mobile, midplane, nontender  Adnexa: Normal; nonpalpable nontender  RV: Normal external exam  Bladder: Nontender MUSCULOSKELETAL: Normal range of motion. No tenderness.  No cyanosis, clubbing, or edema.     Assessment:   1.  Normal IUD string check 4 weeks status post insertion   Plan:   Return for annual exam as scheduled in November 2019  Brayton Mars, MD  Note: This dictation was prepared with Dragon dictation along with smaller phrase technology. Any transcriptional errors that result from this process are unintentional.

## 2018-08-02 ENCOUNTER — Other Ambulatory Visit: Payer: Self-pay | Admitting: Internal Medicine

## 2018-09-10 ENCOUNTER — Other Ambulatory Visit: Payer: Self-pay | Admitting: Internal Medicine

## 2018-09-10 MED ORDER — PHENTERMINE HCL 37.5 MG PO TABS: 37.5000 mg | ORAL_TABLET | Freq: Every day | ORAL | 0 refills | Status: DC

## 2018-09-10 NOTE — Telephone Encounter (Signed)
Refilled: 06/11/2018 Last OV: 06/11/2018 Next OV: 09/18/2018

## 2018-09-18 ENCOUNTER — Ambulatory Visit: Payer: BC Managed Care – PPO | Admitting: Internal Medicine

## 2018-09-18 ENCOUNTER — Encounter: Payer: Self-pay | Admitting: Internal Medicine

## 2018-09-18 VITALS — BP 120/74 | HR 88 | Temp 98.4°F | Resp 14 | Ht 66.0 in | Wt 218.0 lb

## 2018-09-18 DIAGNOSIS — F411 Generalized anxiety disorder: Secondary | ICD-10-CM

## 2018-09-18 DIAGNOSIS — D582 Other hemoglobinopathies: Secondary | ICD-10-CM | POA: Diagnosis not present

## 2018-09-18 DIAGNOSIS — I10 Essential (primary) hypertension: Secondary | ICD-10-CM | POA: Diagnosis not present

## 2018-09-18 DIAGNOSIS — K760 Fatty (change of) liver, not elsewhere classified: Secondary | ICD-10-CM

## 2018-09-18 DIAGNOSIS — E669 Obesity, unspecified: Secondary | ICD-10-CM

## 2018-09-18 DIAGNOSIS — Z23 Encounter for immunization: Secondary | ICD-10-CM | POA: Diagnosis not present

## 2018-09-18 LAB — CBC WITH DIFFERENTIAL/PLATELET
Basophils Absolute: 0 10*3/uL (ref 0.0–0.1)
Basophils Relative: 0.5 % (ref 0.0–3.0)
EOS PCT: 0.8 % (ref 0.0–5.0)
Eosinophils Absolute: 0.1 10*3/uL (ref 0.0–0.7)
HEMATOCRIT: 44.1 % (ref 36.0–46.0)
HEMOGLOBIN: 15 g/dL (ref 12.0–15.0)
LYMPHS ABS: 1.9 10*3/uL (ref 0.7–4.0)
LYMPHS PCT: 28 % (ref 12.0–46.0)
MCHC: 34.1 g/dL (ref 30.0–36.0)
MCV: 92.9 fl (ref 78.0–100.0)
MONOS PCT: 6.9 % (ref 3.0–12.0)
Monocytes Absolute: 0.5 10*3/uL (ref 0.1–1.0)
Neutro Abs: 4.4 10*3/uL (ref 1.4–7.7)
Neutrophils Relative %: 63.8 % (ref 43.0–77.0)
Platelets: 251 10*3/uL (ref 150.0–400.0)
RBC: 4.74 Mil/uL (ref 3.87–5.11)
RDW: 12.9 % (ref 11.5–15.5)
WBC: 6.9 10*3/uL (ref 4.0–10.5)

## 2018-09-18 LAB — COMPREHENSIVE METABOLIC PANEL
ALT: 30 U/L (ref 0–35)
AST: 23 U/L (ref 0–37)
Albumin: 4 g/dL (ref 3.5–5.2)
Alkaline Phosphatase: 44 U/L (ref 39–117)
BUN: 10 mg/dL (ref 6–23)
CHLORIDE: 100 meq/L (ref 96–112)
CO2: 31 meq/L (ref 19–32)
CREATININE: 0.97 mg/dL (ref 0.40–1.20)
Calcium: 9 mg/dL (ref 8.4–10.5)
GFR: 65.22 mL/min (ref >60.00)
Glucose, Bld: 100 mg/dL — ABNORMAL HIGH (ref 70–99)
Potassium: 3.7 mEq/L (ref 3.5–5.1)
SODIUM: 138 meq/L (ref 135–145)
Total Bilirubin: 1.1 mg/dL (ref 0.2–1.2)
Total Protein: 6.7 g/dL (ref 6.0–8.3)

## 2018-09-18 LAB — LIPID PANEL
CHOL/HDL RATIO: 2
Cholesterol: 138 mg/dL (ref 0–200)
HDL: 56.1 mg/dL (ref >39.00)
LDL CALC: 59 mg/dL (ref 0–99)
NONHDL: 81.5
Triglycerides: 115 mg/dL (ref 0.0–149.0)
VLDL: 23 mg/dL (ref 0.0–40.0)

## 2018-09-18 LAB — HEMOGLOBIN A1C: HEMOGLOBIN A1C: 5.5 % (ref 4.6–6.5)

## 2018-09-18 MED ORDER — PHENTERMINE HCL 37.5 MG PO TABS: 37.5000 mg | ORAL_TABLET | Freq: Every day | ORAL | 2 refills | Status: DC

## 2018-09-18 NOTE — Progress Notes (Signed)
Subjective:  Patient ID: Laura [name], female    DOB: [birth year]  Age: 47 y.o. MRN: [id number]  CC: The primary encounter diagnosis was Elevated hemoglobin ([place]). Diagnoses of Need for immunization against influenza, Obesity (BMI 35.0-39.9 without comorbidity), Hepatic steatosis, Essential hypertension, and GAD (generalized anxiety disorder) were also pertinent to this visit.  HPI Laura [name] presents for folow up on  Hypertension , GAD,  And obesity managed with phentermine.  She has had 9 lb loss since July .  She is tolerating the medication without side effects.  Diet reviewed.  She has been considering the KETO diet,  Discussed the pros and cons of various popular diets.  She is exercising 4 time s weekly , 60 minutes per session using aerobics and weight lifting      Outpatient Medications Prior to Visit  Medication Sig Dispense Refill  . ALPRAZolam (XANAX) 0.5 MG tablet Take 1 tablet (0.5 mg total) by mouth at bedtime as needed for anxiety. 30 tablet 3  . cyanocobalamin (,VITAMIN B-12,) 1000 MCG/ML injection Inject 1 mL (1,000 mcg total) into the muscle once a week. 10 mL 6  . desvenlafaxine (PRISTIQ) 50 MG 24 hr tablet TAKE 1 TABLET BY MOUTH EVERY DAY 90 tablet 1  . esomeprazole (NEXIUM) 20 MG capsule Take 20 mg by mouth daily at 12 noon.    Marland Kitchen levonorgestrel (MIRENA) 20 MCG/24HR IUD 1 each by Intrauterine route once.    . lidocaine (XYLOCAINE) 2 % solution Use as directed 15 mLs in the mouth or throat as needed for mouth pain. 15 mL 0  . losartan-hydrochlorothiazide (HYZAAR) 50-12.5 MG tablet Take 1 tablet by mouth daily. 90 tablet 3  . Syringe/Needle, Disp, (SYRINGE 3CC/25GX1") 25G X 1" 3 ML MISC Use for b12 injections 50 each 0  . phentermine (ADIPEX-P) 37.5 MG tablet Take 1 tablet (37.5 mg total) by mouth daily before breakfast. 30 tablet 0   No facility-administered medications prior to visit.     Review of Systems;  Patient denies headache, fevers, malaise,  unintentional weight loss, skin rash, eye pain, sinus congestion and sinus pain, sore throat, dysphagia,  hemoptysis , cough, dyspnea, wheezing, chest pain, palpitations, orthopnea, edema, abdominal pain, nausea, melena, diarrhea, constipation, flank pain, dysuria, hematuria, urinary  Frequency, nocturia, numbness, tingling, seizures,  Focal weakness, Loss of consciousness,  Tremor, insomnia, depression, anxiety, and suicidal ideation.      Objective:  BP 120/74 (BP Location: Left Arm, Patient Position: Sitting, Cuff Size: Large)   Pulse 88   Temp 98.4 F (36.9 C) (Oral)   Resp 14   Ht 5\' 6"  (1.676 m)   Wt 218 lb (98.9 kg)   SpO2 96%   BMI 35.19 kg/m   BP Readings from Last 3 Encounters:  09/18/18 120/74  06/13/18 129/70  06/11/18 110/76    Wt Readings from Last 3 Encounters:  09/18/18 218 lb (98.9 kg)  06/13/18 227 lb 8 oz (103.2 kg)  06/11/18 227 lb (103 kg)    General appearance: alert, cooperative and appears stated age Ears: normal TM's and external ear canals both ears Throat: lips, mucosa, and tongue normal; teeth and gums normal Neck: no adenopathy, no carotid bruit, supple, symmetrical, trachea midline and thyroid not enlarged, symmetric, no tenderness/mass/nodules Back: symmetric, no curvature. ROM normal. No CVA tenderness. Lungs: clear to auscultation bilaterally Heart: regular rate and rhythm, S1, S2 normal, no murmur, click, rub or gallop Abdomen: soft, non-tender; bowel sounds normal; no masses,  no organomegaly Pulses:  2+ and symmetric Skin: Skin color, texture, turgor normal. No rashes or lesions Lymph nodes: Cervical, supraclavicular, and axillary nodes normal.  Lab Results  Component Value Date   HGBA1C 5.5 09/18/2018   HGBA1C 5.3 04/01/2018   HGBA1C 5.6 11/22/2016    Lab Results  Component Value Date   CREATININE 0.97 09/18/2018   CREATININE 0.83 04/01/2018   CREATININE 0.92 12/26/2017    Lab Results  Component Value Date   WBC 6.9  09/18/2018   HGB 15.0 09/18/2018   HCT 44.1 09/18/2018   PLT 251.0 09/18/2018   GLUCOSE 100 (H) 09/18/2018   CHOL 138 09/18/2018   TRIG 115.0 09/18/2018   HDL 56.10 09/18/2018   LDLDIRECT 59.0 11/22/2016   LDLCALC 59 09/18/2018   ALT 30 09/18/2018   AST 23 09/18/2018   NA 138 09/18/2018   K 3.7 09/18/2018   CL 100 09/18/2018   CREATININE 0.97 09/18/2018   BUN 10 09/18/2018   CO2 31 09/18/2018   TSH 2.26 04/01/2018   HGBA1C 5.5 09/18/2018   MICROALBUR 0.9 11/22/2016     Assessment & Plan:   Problem List Items Addressed This Visit    Elevated hemoglobin ([place]) - Primary   Relevant Orders   CBC with Differential/Platelet (Completed)   Essential hypertension    Well controlled on current regimen. Renal function stable, no changes today.  Lab Results  Component Value Date   CREATININE 0.97 09/18/2018   Lab Results  Component Value Date   NA 138 09/18/2018   K 3.7 09/18/2018   CL 100 09/18/2018   CO2 31 09/18/2018         GAD (generalized anxiety disorder)    Improved with Pristiq.  No weight gain.  Refills given       Hepatic steatosis   Relevant Orders   Lipid panel (Completed)   Comprehensive metabolic panel (Completed)   Obesity (BMI 35.0-39.9 without comorbidity)    I have congratulated her in reduction of   BMI and encouraged  Continued weight loss with goal of  5% of body weight over the next 3 months using a low glycemic index diet , phentermine for appetite suppression, and regular exercise a minimum of 5 days per week.I have authorized the refill of phentermine for 3 months. Goal is 11 lbs wt loss   Lab Results  Component Value Date   HGBA1C 5.5 09/18/2018         Relevant Medications   phentermine (ADIPEX-P) 37.5 MG tablet (Start on 10/10/2018)   Other Relevant Orders   Hemoglobin A1c (Completed)    Other Visit Diagnoses    Need for immunization against influenza       Relevant Orders   Flu Vaccine QUAD 36+ mos IM (Completed)     A total  of 25 minutes of face to face time was spent with patient more than half of which was spent in counselling about the above mentioned conditions  and coordination of care   I am having Carreen A. Corinna Capra "Orson Slick" maintain her levonorgestrel, losartan-hydrochlorothiazide, esomeprazole, ALPRAZolam, cyanocobalamin, SYRINGE 3CC/25GX1", lidocaine, desvenlafaxine, and phentermine.  Meds ordered this encounter  Medications  . phentermine (ADIPEX-P) 37.5 MG tablet    Sig: Take 1 tablet (37.5 mg total) by mouth daily before breakfast.    Dispense:  30 tablet    Refill:  2    Medications Discontinued During This Encounter  Medication Reason  . phentermine (ADIPEX-P) 37.5 MG tablet Reorder    Follow-up: Return in about 3  months (around 12/19/2018).   Crecencio Mc, MD

## 2018-09-18 NOTE — Patient Instructions (Signed)
You are doing great!  KETO diet is ok if you choose more plant proteins as opposed to animal   See yo in 3 months

## 2018-09-21 NOTE — Assessment & Plan Note (Signed)
Improved with Pristiq.  No weight gain.  Refills given  

## 2018-09-21 NOTE — Assessment & Plan Note (Signed)
Well controlled on current regimen. Renal function stable, no changes today.  Lab Results  Component Value Date   CREATININE 0.97 09/18/2018   Lab Results  Component Value Date   NA 138 09/18/2018   K 3.7 09/18/2018   CL 100 09/18/2018   CO2 31 09/18/2018

## 2018-09-21 NOTE — Assessment & Plan Note (Addendum)
I have congratulated her in reduction of   BMI and encouraged  Continued weight loss with goal of  5% of body weight over the next 3 months using a low glycemic index diet , phentermine for appetite suppression, and regular exercise a minimum of 5 days per week.I have authorized the refill of phentermine for 3 months. Goal is 11 lbs wt loss   Lab Results  Component Value Date   HGBA1C 5.5 09/18/2018

## 2018-09-29 ENCOUNTER — Other Ambulatory Visit: Payer: Self-pay | Admitting: Internal Medicine

## 2018-09-30 NOTE — Progress Notes (Signed)
ANNUAL PREVENTATIVE CARE GYN  ENCOUNTER NOTE  Subjective:       Laura [name] is a 47 y.o. G85P0020 female here for a routine annual gynecologic exam.  Current complaints: 1.   None  Using IUD, Mirena, for contraception. Decreased libido is noted; is willing to try new medication on the market. Occasional vasomotor symptoms are noted. Still having occasional spotting without regular cycles. Patient has lost 30  pounds in the past year.  She feels very healthy and upbeat. Bowel function is notable for mild constipation which is managed with a stool softener.  Bladder function is normal.   Gynecologic History No LMP recorded. (Menstrual status: IUD). Contraception: IUD Last Pap:  09/2017 Results were: normal Last mammogram: 11/09/2017  birad 1. Results were: normal  Obstetric History OB History  Gravida Para Term Preterm AB Living  2       2    SAB TAB Ectopic Multiple Live Births  0            # Outcome Date GA Lbr Len/2nd Weight Sex Delivery Anes PTL Lv  2 AB              Complications: Missed abortion  1 AB              Complications: Missed abortion    Past Medical History:  Diagnosis Date  . Allergy   . Dysplasia of cervix   . Herpes genitalia   . Hypertension    HX of high readings   . Increased BMI   . Menstrual migraine     Past Surgical History:  Procedure Laterality Date  . DILATION AND CURETTAGE OF UTERUS  2014 FEB and August  . MOUTH SURGERY    . TONSILECTOMY/ADENOIDECTOMY WITH MYRINGOTOMY Bilateral 2011    Current Outpatient Medications on File Prior to Visit  Medication Sig Dispense Refill  . ALPRAZolam (XANAX) 0.5 MG tablet Take 1 tablet (0.5 mg total) by mouth at bedtime as needed for anxiety. 30 tablet 3  . cyanocobalamin (,VITAMIN B-12,) 1000 MCG/ML injection Inject 1 mL (1,000 mcg total) into the muscle once a week. 10 mL 6  . desvenlafaxine (PRISTIQ) 50 MG 24 hr tablet TAKE 1 TABLET BY MOUTH EVERY DAY 90 tablet 1  . esomeprazole (NEXIUM) 20  MG capsule Take 20 mg by mouth daily at 12 noon.    Marland Kitchen levonorgestrel (MIRENA) 20 MCG/24HR IUD 1 each by Intrauterine route once.    . lidocaine (XYLOCAINE) 2 % solution Use as directed 15 mLs in the mouth or throat as needed for mouth pain. 15 mL 0  . losartan-hydrochlorothiazide (HYZAAR) 50-12.5 MG tablet TAKE 1 TABLET BY MOUTH EVERY DAY 90 tablet 1  . [START ON 10/10/2018] phentermine (ADIPEX-P) 37.5 MG tablet Take 1 tablet (37.5 mg total) by mouth daily before breakfast. 30 tablet 2  . Syringe/Needle, Disp, (SYRINGE 3CC/25GX1") 25G X 1" 3 ML MISC Use for b12 injections 50 each 0   No current facility-administered medications on file prior to visit.     Allergies  Allergen Reactions  . Bee Venom     Social History   Socioeconomic History  . Marital status: Married    Spouse name: Not on file  . Number of children: Not on file  . Years of education: Not on file  . Highest education level: Not on file  Occupational History  . Occupation: Solicitor: [employer]  . Financial resource strain: Not on  file  . Food insecurity:    Worry: Not on file    Inability: Not on file  . Transportation needs:    Medical: Not on file    Non-medical: Not on file  Tobacco Use  . Smoking status: Never Smoker  . Smokeless tobacco: Never Used  Substance and Sexual Activity  . Alcohol use: Yes    Comment: occasioally  . Drug use: No  . Sexual activity: Yes    Birth control/protection: IUD  Lifestyle  . Physical activity:    Days per week: 2 days    Minutes per session: 30 min  . Stress: Not at all  Relationships  . Social connections:    Talks on phone: Not on file    Gets together: Not on file    Attends religious service: Not on file    Active member of club or organization: Not on file    Attends meetings of clubs or organizations: Not on file    Relationship status: Not on file  . Intimate partner violence:    Fear of current or ex partner: No     Emotionally abused: No    Physically abused: No    Forced sexual activity: No  Other Topics Concern  . Not on file  Social History Narrative  . Not on file    Family History  Problem Relation Age of Onset  . Arthritis Mother   . Hypertension Mother   . Diabetes Mother   . Heart disease Father   . Hyperlipidemia Father   . Hypertension Father   . Cancer Neg Hx   . Breast cancer Neg Hx     The following portions of the patient's history were reviewed and updated as appropriate: allergies, current medications, past family history, past medical history, past social history, past surgical history and problem list.  Review of Systems Review of Systems  Constitutional: Negative.   Respiratory: Negative.   Cardiovascular: Negative.   Gastrointestinal: Negative.   Genitourinary:       Slight decreased libido  Musculoskeletal: Negative.   Skin: Negative.   Psychiatric/Behavioral: Negative.   All other systems reviewed and are negative.     Objective:  BP (!) 142/84   Ht 5\' 6"  (1.676 m)   Wt 221 lb 6.4 oz (100.4 kg)   BMI 35.73 kg/m  CONSTITUTIONAL: Well-developed, well-nourished female in no acute distress.  PSYCHIATRIC: Normal mood and affect. Normal behavior. Normal judgment and thought content. [place]: Alert and oriented to person, place, and time. Normal muscle tone coordination. No cranial nerve deficit noted. HENT:  Normocephalic, atraumatic, External right and left ear normal. EYES: Conjunctivae and EOM are normal. No scleral icterus.  NECK: Normal range of motion, supple, no masses.  Normal thyroid.  SKIN: Skin is warm and dry. No rash noted. Not diaphoretic. No erythema. No pallor. CARDIOVASCULAR: Normal heart rate noted, regular rhythm, no murmur. RESPIRATORY: Clear to auscultation bilaterally. Effort and breath sounds normal, no problems with respiration noted. BREASTS: Symmetric in size. No masses, skin changes, nipple drainage, or lymphadenopathy. ABDOMEN:  Soft, normal bowel sounds, no distention noted.  No tenderness, rebound or guarding.  BLADDER: Normal PELVIC:  External Genitalia: Normal  BUS: Normal  Vagina: Normal  Cervix: Normal; IUD strings 2 cm, no cervical motion tenderness  Uterus: Normal; midplane, normal size and shape, mobile, nontender  Adnexa: Normal; nonpalpable and nontender  RV: External Exam NormaI, No Rectal Masses and Normal Sphincter tone  MUSCULOSKELETAL: Normal range of motion. No tenderness.  No cyanosis, clubbing, or edema.  2+ distal pulses. LYMPHATIC: No Axillary, Supraclavicular, or Inguinal Adenopathy.    Assessment:   Annual gynecologic examination 47 y.o. Contraception: IUD  Weight loss, desired BMI: 35.7 Per PCP records patient has lost 30 pounds in the last year.  Decreased libido, willing to try medication to help with sex drive.   Plan:  Pap: 2021 Mammogram: Ordered Stool Guaiac Testing:  Not Indicated Labs: thru pcp Routine preventative health maintenance measures emphasized: Exercise/Diet/Weight control, Tobacco Warnings and Alcohol/Substance use risks  Vileesy prescription given. Return to [place], CMA  Brayton Mars, MD   Shaune Pascal CMA acting as scribe for Dr Enzo Bi. I have reviewed, updated, and concur with the information scribed by Shaune Pascal, CMA  Note: This dictation was prepared with Dragon dictation along with smaller phrase technology. Any transcriptional errors that result from this process are unintentional.

## 2018-10-01 ENCOUNTER — Ambulatory Visit (INDEPENDENT_AMBULATORY_CARE_PROVIDER_SITE_OTHER): Payer: BC Managed Care – PPO | Admitting: Obstetrics and Gynecology

## 2018-10-01 VITALS — BP 142/84 | Ht 66.0 in | Wt 221.4 lb

## 2018-10-01 DIAGNOSIS — Z01419 Encounter for gynecological examination (general) (routine) without abnormal findings: Secondary | ICD-10-CM | POA: Diagnosis not present

## 2018-10-01 DIAGNOSIS — E669 Obesity, unspecified: Secondary | ICD-10-CM | POA: Diagnosis not present

## 2018-10-01 DIAGNOSIS — Z975 Presence of (intrauterine) contraceptive device: Secondary | ICD-10-CM

## 2018-10-01 DIAGNOSIS — Z1231 Encounter for screening mammogram for malignant neoplasm of breast: Secondary | ICD-10-CM

## 2018-10-01 DIAGNOSIS — R6882 Decreased libido: Secondary | ICD-10-CM

## 2018-10-01 NOTE — Patient Instructions (Signed)
1.  Pap smear is not done.  Next Pap smear is due in 2021 2.  Mammogram is ordered. 3.  Screening labs are obtained through primary care 4.  Continue with healthy eating, exercise, and control weight loss. 5.  Consider adding calcium 600 mg twice a day and vitamin D 400 international units twice a day for helping to prevent osteoporosis development at menopause 6.  Trial of Vileesy for low libido-prescription is given 7.  Return in 1 year for annual exam-Dr. Andrews Maintenance, Female Adopting a healthy lifestyle and getting preventive care can go a long way to promote health and wellness. Talk with your health care provider about what schedule of regular examinations is right for you. This is a good chance for you to check in with your provider about disease prevention and staying healthy. In between checkups, there are plenty of things you can do on your own. Experts have done a lot of research about which lifestyle changes and preventive measures are most likely to keep you healthy. Ask your health care provider for more information. Weight and diet Eat a healthy diet  Be sure to include plenty of vegetables, fruits, low-fat dairy products, and lean protein.  Do not eat a lot of foods high in solid fats, added sugars, or salt.  Get regular exercise. This is one of the most important things you can do for your health. ? Most adults should exercise for at least 150 minutes each week. The exercise should increase your heart rate and make you sweat (moderate-intensity exercise). ? Most adults should also do strengthening exercises at least twice a week. This is in addition to the moderate-intensity exercise.  Maintain a healthy weight  Body mass index (BMI) is a measurement that can be used to identify possible weight problems. It estimates body fat based on height and weight. Your health care provider can help determine your BMI and help you achieve or maintain a healthy weight.  For  females 18 years of age and older: ? A BMI below 18.5 is considered underweight. ? A BMI of 18.5 to 24.9 is normal. ? A BMI of 25 to 29.9 is considered overweight. ? A BMI of 30 and above is considered obese.  Watch levels of cholesterol and blood lipids  You should start having your blood tested for lipids and cholesterol at 47 years of age, then have this test every 5 years.  You may need to have your cholesterol levels checked more often if: ? Your lipid or cholesterol levels are high. ? You are older than 47 years of age. ? You are at high risk for heart disease.  Cancer screening Lung Cancer  Lung cancer screening is recommended for adults 52-62 years old who are at high risk for lung cancer because of a history of smoking.  A yearly low-dose CT scan of the lungs is recommended for people who: ? Currently smoke. ? Have quit within the past 15 years. ? Have at least a 30-pack-year history of smoking. A pack year is smoking an average of one pack of cigarettes a day for 1 year.  Yearly screening should continue until it has been 15 years since you quit.  Yearly screening should stop if you develop a health problem that would prevent you from having lung cancer treatment.  Breast Cancer  Practice breast self-awareness. This means understanding how your breasts normally appear and feel.  It also means doing regular breast self-exams. Let your health care  provider know about any changes, no matter how small.  If you are in your 20s or 30s, you should have a clinical breast exam (CBE) by a health care provider every 1-3 years as part of a regular health exam.  If you are 66 or older, have a CBE every year. Also consider having a breast X-ray (mammogram) every year.  If you have a family history of breast cancer, talk to your health care provider about genetic screening.  If you are at high risk for breast cancer, talk to your health care provider about having an MRI and a  mammogram every year.  Breast cancer gene (BRCA) assessment is recommended for women who have family members with BRCA-related cancers. BRCA-related cancers include: ? Breast. ? Ovarian. ? Tubal. ? Peritoneal cancers.  Results of the assessment will determine the need for genetic counseling and BRCA1 and BRCA2 testing.  Cervical Cancer Your health care provider may recommend that you be screened regularly for cancer of the pelvic organs (ovaries, uterus, and vagina). This screening involves a pelvic examination, including checking for microscopic changes to the surface of your cervix (Pap test). You may be encouraged to have this screening done every 3 years, beginning at age 90.  For women ages 41-65, health care providers may recommend pelvic exams and Pap testing every 3 years, or they may recommend the Pap and pelvic exam, combined with testing for human papilloma virus (HPV), every 5 years. Some types of HPV increase your risk of cervical cancer. Testing for HPV may also be done on women of any age with unclear Pap test results.  Other health care providers may not recommend any screening for nonpregnant women who are considered low risk for pelvic cancer and who do not have symptoms. Ask your health care provider if a screening pelvic exam is right for you.  If you have had past treatment for cervical cancer or a condition that could lead to cancer, you need Pap tests and screening for cancer for at least 20 years after your treatment. If Pap tests have been discontinued, your risk factors (such as having a new sexual partner) need to be reassessed to determine if screening should resume. Some women have medical problems that increase the chance of getting cervical cancer. In these cases, your health care provider may recommend more frequent screening and Pap tests.  Colorectal Cancer  This type of cancer can be detected and often prevented.  Routine colorectal cancer screening usually  begins at 47 years of age and continues through 47 years of age.  Your health care provider may recommend screening at an earlier age if you have risk factors for colon cancer.  Your health care provider may also recommend using home test kits to check for hidden blood in the stool.  A small camera at the end of a tube can be used to examine your colon directly (sigmoidoscopy or colonoscopy). This is done to check for the earliest forms of colorectal cancer.  Routine screening usually begins at age 54.  Direct examination of the colon should be repeated every 5-10 years through 47 years of age. However, you may need to be screened more often if early forms of precancerous polyps or small growths are found.  Skin Cancer  Check your skin from head to toe regularly.  Tell your health care provider about any new moles or changes in moles, especially if there is a change in a mole's shape or color.  Also tell your  health care provider if you have a mole that is larger than the size of a pencil eraser.  Always use sunscreen. Apply sunscreen liberally and repeatedly throughout the day.  Protect yourself by wearing long sleeves, pants, a wide-brimmed hat, and sunglasses whenever you are outside.  Heart disease, diabetes, and high blood pressure  High blood pressure causes heart disease and increases the risk of stroke. High blood pressure is more likely to develop in: ? People who have blood pressure in the high end of the normal range (130-139/85-89 mm Hg). ? People who are overweight or obese. ? People who are African American.  If you are 14-32 years of age, have your blood pressure checked every 3-5 years. If you are 70 years of age or older, have your blood pressure checked every year. You should have your blood pressure measured twice-once when you are at a hospital or clinic, and once when you are not at a hospital or clinic. Record the average of the two measurements. To check your  blood pressure when you are not at a hospital or clinic, you can use: ? An automated blood pressure machine at a pharmacy. ? A home blood pressure monitor.  If you are between 53 years and 13 years old, ask your health care provider if you should take aspirin to prevent strokes.  Have regular diabetes screenings. This involves taking a blood sample to check your fasting blood sugar level. ? If you are at a normal weight and have a low risk for diabetes, have this test once every three years after 47 years of age. ? If you are overweight and have a high risk for diabetes, consider being tested at a younger age or more often. Preventing infection Hepatitis B  If you have a higher risk for hepatitis B, you should be screened for this virus. You are considered at high risk for hepatitis B if: ? You were born in a country where hepatitis B is common. Ask your health care provider which countries are considered high risk. ? Your parents were born in a high-risk country, and you have not been immunized against hepatitis B (hepatitis B vaccine). ? You have HIV or AIDS. ? You use needles to inject street drugs. ? You live with someone who has hepatitis B. ? You have had sex with someone who has hepatitis B. ? You get hemodialysis treatment. ? You take certain medicines for conditions, including cancer, organ transplantation, and autoimmune conditions.  Hepatitis C  Blood testing is recommended for: ? Everyone born from [birth year] through 1965. ? Anyone with known risk factors for hepatitis C.  Sexually transmitted infections (STIs)  You should be screened for sexually transmitted infections (STIs) including gonorrhea and chlamydia if: ? You are sexually active and are younger than 47 years of age. ? You are older than 47 years of age and your health care provider tells you that you are at risk for this type of infection. ? Your sexual activity has changed since you were last screened and you are at  an increased risk for chlamydia or gonorrhea. Ask your health care provider if you are at risk.  If you do not have HIV, but are at risk, it may be recommended that you take a prescription medicine daily to prevent HIV infection. This is called pre-exposure prophylaxis (PrEP). You are considered at risk if: ? You are sexually active and do not regularly use condoms or know the HIV status of your partner(s). ?  You take drugs by injection. ? You are sexually active with a partner who has HIV.  Talk with your health care provider about whether you are at high risk of being infected with HIV. If you choose to begin PrEP, you should first be tested for HIV. You should then be tested every 3 months for as long as you are taking PrEP. Pregnancy  If you are premenopausal and you may become pregnant, ask your health care provider about preconception counseling.  If you may become pregnant, take 400 to 800 micrograms (mcg) of folic acid every day.  If you want to prevent pregnancy, talk to your health care provider about birth control (contraception). Osteoporosis and menopause  Osteoporosis is a disease in which the bones lose minerals and strength with aging. This can result in serious bone fractures. Your risk for osteoporosis can be identified using a bone density scan.  If you are 11 years of age or older, or if you are at risk for osteoporosis and fractures, ask your health care provider if you should be screened.  Ask your health care provider whether you should take a calcium or vitamin D supplement to lower your risk for osteoporosis.  Menopause may have certain physical symptoms and risks.  Hormone replacement therapy may reduce some of these symptoms and risks. Talk to your health care provider about whether hormone replacement therapy is right for you. Follow these instructions at home:  Schedule regular health, dental, and eye exams.  Stay current with your immunizations.  Do not  use any tobacco products including cigarettes, chewing tobacco, or electronic cigarettes.  If you are pregnant, do not drink alcohol.  If you are breastfeeding, limit how much and how often you drink alcohol.  Limit alcohol intake to no more than 1 drink per day for nonpregnant women. One drink equals 12 ounces of beer, 5 ounces of wine, or 1 ounces of hard liquor.  Do not use street drugs.  Do not share needles.  Ask your health care provider for help if you need support or information about quitting drugs.  Tell your health care provider if you often feel depressed.  Tell your health care provider if you have ever been abused or do not feel safe at home. This information is not intended to replace advice given to you by your health care provider. Make sure you discuss any questions you have with your health care provider. Document Released: 05/22/2011 Document Revised: 04/13/2016 Document Reviewed: 08/10/2015 Elsevier Interactive Patient Education  [employer].

## 2018-10-20 HISTORY — PX: BREAST BIOPSY: SHX20

## 2018-10-31 ENCOUNTER — Other Ambulatory Visit: Payer: Self-pay

## 2018-10-31 ENCOUNTER — Emergency Department: Payer: BC Managed Care – PPO

## 2018-10-31 ENCOUNTER — Inpatient Hospital Stay: Payer: BC Managed Care – PPO | Admitting: Anesthesiology

## 2018-10-31 ENCOUNTER — Encounter
Admission: EM | Disposition: A | Discharge status: Home / Self Care | Payer: Self-pay | Source: Home / Self Care | Attending: Internal Medicine

## 2018-10-31 ENCOUNTER — Ambulatory Visit: Payer: Self-pay

## 2018-10-31 ENCOUNTER — Ambulatory Visit: Payer: BC Managed Care – PPO | Admitting: Family Medicine

## 2018-10-31 ENCOUNTER — Encounter: Payer: Self-pay | Admitting: Family Medicine

## 2018-10-31 ENCOUNTER — Inpatient Hospital Stay
Admission: EM | Admit: 2018-10-31 | Discharge: 2018-11-03 | DRG: 585 | Disposition: A | Discharge status: Home / Self Care | Payer: BC Managed Care – PPO | Attending: Internal Medicine | Admitting: Internal Medicine

## 2018-10-31 VITALS — BP 130/78 | HR 100 | Temp 98.4°F | Ht 66.0 in | Wt 222.0 lb

## 2018-10-31 DIAGNOSIS — K5900 Constipation, unspecified: Secondary | ICD-10-CM | POA: Diagnosis present

## 2018-10-31 DIAGNOSIS — I10 Essential (primary) hypertension: Secondary | ICD-10-CM | POA: Diagnosis present

## 2018-10-31 DIAGNOSIS — E876 Hypokalemia: Secondary | ICD-10-CM | POA: Diagnosis present

## 2018-10-31 DIAGNOSIS — Z8261 Family history of arthritis: Secondary | ICD-10-CM | POA: Diagnosis not present

## 2018-10-31 DIAGNOSIS — M7989 Other specified soft tissue disorders: Secondary | ICD-10-CM | POA: Diagnosis not present

## 2018-10-31 DIAGNOSIS — L0291 Cutaneous abscess, unspecified: Secondary | ICD-10-CM | POA: Diagnosis not present

## 2018-10-31 DIAGNOSIS — Z91030 Bee allergy status: Secondary | ICD-10-CM | POA: Diagnosis not present

## 2018-10-31 DIAGNOSIS — Z6835 Body mass index (BMI) 35.0-35.9, adult: Secondary | ICD-10-CM | POA: Diagnosis not present

## 2018-10-31 DIAGNOSIS — Z8249 Family history of ischemic heart disease and other diseases of the circulatory system: Secondary | ICD-10-CM | POA: Diagnosis not present

## 2018-10-31 DIAGNOSIS — E669 Obesity, unspecified: Secondary | ICD-10-CM | POA: Diagnosis present

## 2018-10-31 DIAGNOSIS — F329 Major depressive disorder, single episode, unspecified: Secondary | ICD-10-CM | POA: Diagnosis present

## 2018-10-31 DIAGNOSIS — Z833 Family history of diabetes mellitus: Secondary | ICD-10-CM | POA: Diagnosis not present

## 2018-10-31 DIAGNOSIS — N611 Abscess of the breast and nipple: Secondary | ICD-10-CM | POA: Diagnosis present

## 2018-10-31 DIAGNOSIS — L03313 Cellulitis of chest wall: Secondary | ICD-10-CM

## 2018-10-31 DIAGNOSIS — F419 Anxiety disorder, unspecified: Secondary | ICD-10-CM | POA: Diagnosis present

## 2018-10-31 DIAGNOSIS — Z975 Presence of (intrauterine) contraceptive device: Secondary | ICD-10-CM | POA: Diagnosis not present

## 2018-10-31 DIAGNOSIS — K219 Gastro-esophageal reflux disease without esophagitis: Secondary | ICD-10-CM | POA: Diagnosis present

## 2018-10-31 DIAGNOSIS — Z8349 Family history of other endocrine, nutritional and metabolic diseases: Secondary | ICD-10-CM

## 2018-10-31 DIAGNOSIS — L0390 Cellulitis, unspecified: Secondary | ICD-10-CM | POA: Diagnosis present

## 2018-10-31 DIAGNOSIS — N61 Mastitis without abscess: Secondary | ICD-10-CM

## 2018-10-31 DIAGNOSIS — Z79899 Other long term (current) drug therapy: Secondary | ICD-10-CM

## 2018-10-31 HISTORY — PX: INCISION AND DRAINAGE ABSCESS: SHX5864

## 2018-10-31 LAB — CBC WITH DIFFERENTIAL/PLATELET
Abs Immature Granulocytes: 0.04 10*3/uL (ref 0.00–0.07)
BASOS ABS: 0 10*3/uL (ref 0.0–0.1)
Basophils Relative: 0 %
Eosinophils Absolute: 0.1 10*3/uL (ref 0.0–0.5)
Eosinophils Relative: 1 %
HCT: 44.8 % (ref 36.0–46.0)
Hemoglobin: 14.8 g/dL (ref 12.0–15.0)
Immature Granulocytes: 0 %
LYMPHS ABS: 2.3 10*3/uL (ref 0.7–4.0)
Lymphocytes Relative: 22 %
MCH: 30.8 pg (ref 26.0–34.0)
MCHC: 33 g/dL (ref 30.0–36.0)
MCV: 93.1 fL (ref 80.0–100.0)
Monocytes Absolute: 0.6 10*3/uL (ref 0.1–1.0)
Monocytes Relative: 6 %
NEUTROS ABS: 7.5 10*3/uL (ref 1.7–7.7)
Neutrophils Relative %: 71 %
PLATELETS: 258 10*3/uL (ref 150–400)
RBC: 4.81 MIL/uL (ref 3.87–5.11)
RDW: 12.6 % (ref 11.5–15.5)
WBC: 10.6 10*3/uL — AB (ref 4.0–10.5)
nRBC: 0 % (ref 0.0–0.2)

## 2018-10-31 LAB — COMPREHENSIVE METABOLIC PANEL
ALBUMIN: 3.9 g/dL (ref 3.5–5.0)
ALT: 22 U/L (ref 0–44)
AST: 21 U/L (ref 15–41)
Alkaline Phosphatase: 50 U/L (ref 38–126)
Anion gap: 9 (ref 5–15)
BILIRUBIN TOTAL: 0.9 mg/dL (ref 0.3–1.2)
BUN: 13 mg/dL (ref 6–20)
CALCIUM: 8.8 mg/dL — AB (ref 8.9–10.3)
CHLORIDE: 103 mmol/L (ref 98–111)
CO2: 26 mmol/L (ref 22–32)
Creatinine, Ser: 0.75 mg/dL (ref 0.44–1.00)
GFR calc Af Amer: >60 mL/min (ref >60)
GLUCOSE: 114 mg/dL — AB (ref 70–99)
Potassium: 3.4 mmol/L — ABNORMAL LOW (ref 3.5–5.1)
SODIUM: 138 mmol/L (ref 135–145)
Total Protein: 7.2 g/dL (ref 6.5–8.1)

## 2018-10-31 LAB — SURGICAL PCR SCREEN
MRSA, PCR: NEGATIVE
Staphylococcus aureus: NEGATIVE

## 2018-10-31 SURGERY — INCISION AND DRAINAGE, ABSCESS
Anesthesia: General | Laterality: Left

## 2018-10-31 MED ORDER — ONDANSETRON HCL 4 MG/2ML IJ SOLN: 4.0000 mg | Freq: Four times a day (QID) | INTRAMUSCULAR | Status: DC | PRN

## 2018-10-31 MED ORDER — HYDROMORPHONE HCL 1 MG/ML IJ SOLN: 0.5000 mg | INTRAMUSCULAR | Status: DC | PRN

## 2018-10-31 MED ORDER — FENTANYL CITRATE (PF) 100 MCG/2ML IJ SOLN
INTRAMUSCULAR | Status: DC | PRN
  Administered 2018-10-31 (×2): 50 ug via INTRAVENOUS

## 2018-10-31 MED ORDER — ALPRAZOLAM 0.5 MG PO TABS: 0.5000 mg | ORAL_TABLET | Freq: Every evening | ORAL | Status: DC | PRN

## 2018-10-31 MED ORDER — ONDANSETRON HCL 4 MG/2ML IJ SOLN
INTRAMUSCULAR | Status: AC
  Filled 2018-10-31: qty 2

## 2018-10-31 MED ORDER — ACETAMINOPHEN 650 MG RE SUPP: 650.0000 mg | Freq: Four times a day (QID) | RECTAL | Status: DC | PRN

## 2018-10-31 MED ORDER — ENOXAPARIN SODIUM 40 MG/0.4ML ~~LOC~~ SOLN
40.0000 mg | SUBCUTANEOUS | Status: DC
  Administered 2018-11-01 – 2018-11-02 (×2): 40 mg via SUBCUTANEOUS
  Filled 2018-10-31 (×2): qty 0.4

## 2018-10-31 MED ORDER — PROMETHAZINE HCL 25 MG/ML IJ SOLN: 6.2500 mg | INTRAMUSCULAR | Status: DC | PRN

## 2018-10-31 MED ORDER — MIDAZOLAM HCL 5 MG/5ML IJ SOLN
INTRAMUSCULAR | Status: DC | PRN
  Administered 2018-10-31: 2 mg via INTRAVENOUS

## 2018-10-31 MED ORDER — OXYCODONE-ACETAMINOPHEN 5-325 MG PO TABS
1.0000 | ORAL_TABLET | Freq: Once | ORAL | Status: DC
  Filled 2018-10-31: qty 1

## 2018-10-31 MED ORDER — PROPOFOL 10 MG/ML IV BOLUS
INTRAVENOUS | Status: DC | PRN
  Administered 2018-10-31: 170 mg via INTRAVENOUS

## 2018-10-31 MED ORDER — LEVONORGESTREL 20 MCG/24HR IU IUD: 1.0000 | INTRAUTERINE_SYSTEM | Freq: Once | INTRAUTERINE | Status: DC

## 2018-10-31 MED ORDER — GLYCOPYRROLATE 0.2 MG/ML IJ SOLN
INTRAMUSCULAR | Status: DC | PRN
  Administered 2018-10-31: 0.2 mg via INTRAVENOUS

## 2018-10-31 MED ORDER — MIDAZOLAM HCL 2 MG/2ML IJ SOLN
INTRAMUSCULAR | Status: AC
  Filled 2018-10-31: qty 2

## 2018-10-31 MED ORDER — KETOROLAC TROMETHAMINE 15 MG/ML IJ SOLN
30.0000 mg | Freq: Four times a day (QID) | INTRAMUSCULAR | Status: DC
  Administered 2018-11-01 – 2018-11-03 (×10): 30 mg via INTRAVENOUS
  Filled 2018-10-31 (×10): qty 2

## 2018-10-31 MED ORDER — KETOROLAC TROMETHAMINE 15 MG/ML IJ SOLN
15.0000 mg | Freq: Four times a day (QID) | INTRAMUSCULAR | Status: DC | PRN
  Administered 2018-10-31: 15 mg via INTRAVENOUS
  Filled 2018-10-31 (×2): qty 1

## 2018-10-31 MED ORDER — LIDOCAINE HCL (PF) 2 % IJ SOLN
INTRAMUSCULAR | Status: DC | PRN
  Administered 2018-10-31: 50 mg

## 2018-10-31 MED ORDER — ONDANSETRON HCL 4 MG PO TABS: 4.0000 mg | ORAL_TABLET | Freq: Four times a day (QID) | ORAL | Status: DC | PRN

## 2018-10-31 MED ORDER — HYDROCODONE-ACETAMINOPHEN 5-325 MG PO TABS
1.0000 | ORAL_TABLET | ORAL | Status: DC | PRN
  Administered 2018-10-31: 2 via ORAL
  Filled 2018-10-31: qty 2

## 2018-10-31 MED ORDER — KETOROLAC TROMETHAMINE 30 MG/ML IJ SOLN
INTRAMUSCULAR | Status: DC | PRN
  Administered 2018-10-31: 30 mg via INTRAVENOUS

## 2018-10-31 MED ORDER — PANTOPRAZOLE SODIUM 40 MG PO TBEC
40.0000 mg | DELAYED_RELEASE_TABLET | Freq: Every day | ORAL | Status: DC
  Administered 2018-10-31 – 2018-11-03 (×4): 40 mg via ORAL
  Filled 2018-10-31 (×4): qty 1

## 2018-10-31 MED ORDER — BUPIVACAINE-EPINEPHRINE (PF) 0.25% -1:200000 IJ SOLN
INTRAMUSCULAR | Status: AC
  Filled 2018-10-31: qty 30

## 2018-10-31 MED ORDER — VANCOMYCIN HCL 10 G IV SOLR
1250.0000 mg | Freq: Two times a day (BID) | INTRAVENOUS | Status: DC
  Administered 2018-11-01 (×3): 1250 mg via INTRAVENOUS
  Filled 2018-10-31 (×6): qty 1250

## 2018-10-31 MED ORDER — DEXAMETHASONE SODIUM PHOSPHATE 10 MG/ML IJ SOLN
INTRAMUSCULAR | Status: DC | PRN
  Administered 2018-10-31: 10 mg via INTRAVENOUS

## 2018-10-31 MED ORDER — FENTANYL CITRATE (PF) 100 MCG/2ML IJ SOLN
INTRAMUSCULAR | Status: AC
  Filled 2018-10-31: qty 2

## 2018-10-31 MED ORDER — HYDROMORPHONE HCL 1 MG/ML IJ SOLN: 0.2500 mg | INTRAMUSCULAR | Status: DC | PRN

## 2018-10-31 MED ORDER — ACETAMINOPHEN 10 MG/ML IV SOLN
INTRAVENOUS | Status: AC
  Filled 2018-10-31: qty 100

## 2018-10-31 MED ORDER — SULFAMETHOXAZOLE-TRIMETHOPRIM 800-160 MG PO TABS: 1.0000 | ORAL_TABLET | Freq: Two times a day (BID) | ORAL | 0 refills | Status: DC

## 2018-10-31 MED ORDER — DEXAMETHASONE SODIUM PHOSPHATE 10 MG/ML IJ SOLN
INTRAMUSCULAR | Status: AC
  Filled 2018-10-31: qty 1

## 2018-10-31 MED ORDER — ACETAMINOPHEN 325 MG PO TABS: 650.0000 mg | ORAL_TABLET | Freq: Four times a day (QID) | ORAL | Status: DC | PRN

## 2018-10-31 MED ORDER — OXYCODONE HCL 5 MG PO TABS
5.0000 mg | ORAL_TABLET | ORAL | Status: DC | PRN
  Administered 2018-11-01 – 2018-11-03 (×7): 10 mg via ORAL
  Filled 2018-10-31 (×7): qty 2

## 2018-10-31 MED ORDER — VANCOMYCIN HCL IN DEXTROSE 1-5 GM/200ML-% IV SOLN
1000.0000 mg | Freq: Once | INTRAVENOUS | Status: AC
  Administered 2018-10-31: 1000 mg via INTRAVENOUS
  Filled 2018-10-31: qty 200

## 2018-10-31 MED ORDER — PHENTERMINE HCL 37.5 MG PO TABS: 37.5000 mg | ORAL_TABLET | Freq: Every day | ORAL | Status: DC

## 2018-10-31 MED ORDER — LACTATED RINGERS IV SOLN
INTRAVENOUS | Status: DC | PRN
  Administered 2018-10-31: 21:00:00 via INTRAVENOUS

## 2018-10-31 MED ORDER — HYDROCHLOROTHIAZIDE 12.5 MG PO CAPS
12.5000 mg | ORAL_CAPSULE | Freq: Every day | ORAL | Status: DC
  Administered 2018-11-01 – 2018-11-03 (×3): 12.5 mg via ORAL
  Filled 2018-10-31 (×3): qty 1

## 2018-10-31 MED ORDER — ACETAMINOPHEN 500 MG PO TABS: 1000.0000 mg | ORAL_TABLET | Freq: Four times a day (QID) | ORAL | Status: DC | PRN

## 2018-10-31 MED ORDER — LOSARTAN POTASSIUM 50 MG PO TABS
50.0000 mg | ORAL_TABLET | Freq: Every day | ORAL | Status: DC
  Administered 2018-11-01 – 2018-11-03 (×3): 50 mg via ORAL
  Filled 2018-10-31 (×3): qty 1

## 2018-10-31 MED ORDER — ONDANSETRON HCL 4 MG/2ML IJ SOLN
INTRAMUSCULAR | Status: DC | PRN
  Administered 2018-10-31: 4 mg via INTRAVENOUS

## 2018-10-31 MED ORDER — LOSARTAN POTASSIUM-HCTZ 50-12.5 MG PO TABS: 1.0000 | ORAL_TABLET | Freq: Every day | ORAL | Status: DC

## 2018-10-31 MED ORDER — KETOROLAC TROMETHAMINE 60 MG/2ML IM SOLN
60.0000 mg | Freq: Once | INTRAMUSCULAR | Status: DC
  Administered 2018-10-31: 60 mg via INTRAMUSCULAR

## 2018-10-31 MED ORDER — BUPIVACAINE-EPINEPHRINE (PF) 0.25% -1:200000 IJ SOLN
INTRAMUSCULAR | Status: DC | PRN
  Administered 2018-10-31: 30 mL

## 2018-10-31 MED ORDER — KETOROLAC TROMETHAMINE 30 MG/ML IJ SOLN
INTRAMUSCULAR | Status: AC
  Filled 2018-10-31: qty 1

## 2018-10-31 MED ORDER — OXYCODONE-ACETAMINOPHEN 5-325 MG PO TABS
1.0000 | ORAL_TABLET | Freq: Once | ORAL | Status: AC
  Administered 2018-10-31: 1 via ORAL

## 2018-10-31 MED ORDER — CEFTRIAXONE SODIUM 1 G IJ SOLR
1.0000 g | Freq: Once | INTRAMUSCULAR | Status: DC
  Administered 2018-10-31: 1 g via INTRAMUSCULAR

## 2018-10-31 MED ORDER — ACETAMINOPHEN 10 MG/ML IV SOLN
INTRAVENOUS | Status: DC | PRN
  Administered 2018-10-31: 1000 mg via INTRAVENOUS

## 2018-10-31 MED ORDER — MUPIROCIN 2 % EX OINT
1.0000 "application " | TOPICAL_OINTMENT | Freq: Two times a day (BID) | CUTANEOUS | Status: DC
  Filled 2018-10-31: qty 22

## 2018-10-31 SURGICAL SUPPLY — 25 items
BNDG GAUZE 4.5X4.1 6PLY STRL (MISCELLANEOUS) IMPLANT
CHLORAPREP W/TINT 26ML (MISCELLANEOUS) IMPLANT
COVER WAND RF STERILE (DRAPES) IMPLANT
DRAIN PENROSE 1/4X12 LTX (DRAIN) IMPLANT
ELECT REM PT RETURN 9FT ADLT (ELECTROSURGICAL) ×3
ELECTRODE REM PT RTRN 9FT ADLT (ELECTROSURGICAL) ×1 IMPLANT
GAUZE SPONGE 4X4 12PLY STRL (GAUZE/BANDAGES/DRESSINGS) ×3 IMPLANT
GLOVE SURG SYN 7.0 (GLOVE) ×6 IMPLANT
GLOVE SURG SYN 7.5  E (GLOVE) ×2
GLOVE SURG SYN 7.5 E (GLOVE) ×1 IMPLANT
GOWN STRL REUS W/ TWL LRG LVL3 (GOWN DISPOSABLE) ×2 IMPLANT
GOWN STRL REUS W/TWL LRG LVL3 (GOWN DISPOSABLE) ×4
KIT TURNOVER KIT A (KITS) ×3 IMPLANT
LABEL OR SOLS (LABEL) IMPLANT
NEEDLE HYPO 22GX1.5 SAFETY (NEEDLE) ×3 IMPLANT
NS IRRIG 500ML POUR BTL (IV SOLUTION) ×3 IMPLANT
PACK BASIN MINOR ARMC (MISCELLANEOUS) ×3 IMPLANT
PAD ABD DERMACEA PRESS 5X9 (GAUZE/BANDAGES/DRESSINGS) ×3 IMPLANT
SLEEVE SCD COMPRESS THIGH MED (MISCELLANEOUS) ×3 IMPLANT
SOL PREP PVP 2OZ (MISCELLANEOUS) ×3
SOLUTION PREP PVP 2OZ (MISCELLANEOUS) ×1 IMPLANT
SWAB CULTURE AMIES ANAERIB BLU (MISCELLANEOUS) ×3 IMPLANT
SYR 10ML LL (SYRINGE) ×3 IMPLANT
SYR BULB 3OZ (MISCELLANEOUS) ×3 IMPLANT
TAPE TRANSPORE STRL 2 31045 (GAUZE/BANDAGES/DRESSINGS) ×3 IMPLANT

## 2018-10-31 NOTE — ED Provider Notes (Signed)
I evaluated the patient at the bedside at the request of the PA, Letitia Neri.  Patient has been on Keflex for 2 days with worsening of the redness as well as swelling and pain in the left breast.  She is now having drainage to the left breast.  On my evaluation the left breast is swollen, tender and warm.  There is an area of induration approximately 5 cm in diameter.  There is a central open area where there is exudate coming from this area which is easily expressed.  Patient will be admitted to the hospital for IV antibiotics.  Likely MRSA since she has failed Keflex as an outpatient.  Slightly elevated white blood cell count.  Patient appears nontoxic and is afebrile.  PA Summers to admit patient to the hospitalist.    Orbie Pyo, MD 10/31/18 [phone]

## 2018-10-31 NOTE — Transfer of Care (Signed)
Immediate Anesthesia Transfer of Care Note  Patient: Laura [name]  Procedure(s) Performed: INCISION AND DRAINAGE LEFT BREAST ABSCESS (Left )  Patient Location: PACU  Anesthesia Type:General  Level of Consciousness: sedated  Airway & Oxygen Therapy: Patient Spontanous Breathing and Patient connected to face mask oxygen  Post-op Assessment: Report given to RN and Post -op Vital signs reviewed and stable  Post vital signs: Reviewed  Last Vitals:  Vitals Value Taken Time  BP 106/61 10/31/2018 10:18 PM  Temp    Pulse 73 10/31/2018 10:19 PM  Resp 15 10/31/2018 10:19 PM  SpO2 96 % 10/31/2018 10:19 PM  Vitals shown include unvalidated device data.  Last Pain:  Vitals:   10/31/18 2043  TempSrc:   PainSc: 1          Complications: No apparent anesthesia complications

## 2018-10-31 NOTE — Patient Instructions (Signed)
If area not improving in next 1-2 days, please go to ER or Urgent care for evaluation

## 2018-10-31 NOTE — Telephone Encounter (Signed)
Pt saw Philis Nettle today.

## 2018-10-31 NOTE — Telephone Encounter (Signed)
FYI

## 2018-10-31 NOTE — Anesthesia Post-op Follow-up Note (Signed)
Anesthesia QCDR form completed.        

## 2018-10-31 NOTE — ED Notes (Signed)
Attempted report. RN will call back for report

## 2018-10-31 NOTE — ED Notes (Signed)
Patient has abscess that started on Tuesday 10/29/18 that has gradually gotten worse

## 2018-10-31 NOTE — Progress Notes (Signed)
Pharmacy Antibiotic Note  Laura [name] is a 47 y.o. female admitted on 10/31/2018 with breast cellulitis w/ abscess.  Pharmacy has been consulted for Vancomycin dosing.  Plan: Patient received Vancomycin 1 gram IV x 1 in ER. Will continue with stacked dosing of Vancomycin Vancomycin 1250 IV every 12 hours.  Goal trough 15-20 mcg/mL. Ke 0.072  t 1/2 9.63  Vd 52.8  Adj BW= 75.5 kg  Scr 0.80 (0.75) Will check trough prior to 5th total dose on 12/14.    Height: 5\' 6"  (167.6 cm) Weight: 220 lb (99.8 kg) IBW/kg (Calculated) : 59.3  Temp (24hrs), Avg:98.2 F (36.8 C), Min:97.8 F (36.6 C), Max:98.4 F (36.9 C)  Recent Labs  Lab 10/31/18 1137  WBC 10.6*  CREATININE 0.75    Estimated Creatinine Clearance: 103.6 mL/min (by C-G formula based on SCr of 0.75 mg/dL).    Allergies  Allergen Reactions  . Bee Venom     Antimicrobials this admission: 12/12 Vanc   >>       >>    Dose adjustments this admission:    Microbiology results:   BCx:     UCx:      Sputum:      MRSA PCR:   Wound cx 12/12:   Thank you for allowing pharmacy to be a part of this patient's care.  Quincy Boy A 10/31/2018 5:57 PM

## 2018-10-31 NOTE — Progress Notes (Signed)
Subjective:    Patient ID: Laura [name], female    DOB: January 18, 1971, 47 y.o.   MRN: [id number]  HPI  Presents to clinic due to boil of skin on left breast.  Currently on keflex prescribed via [facility] &  has taken 8 doses total of 500mg  capsule  Area seems worse, is now open and draining yellow/white discharge. Very tender. Patient wonders if it is a brown recluse spider bite. She does not remember being bitten.   Denies fever or chills.   Patient Active Problem List   Diagnosis Date Noted  . H/O oral aphthous ulcers 06/11/2018  . Hepatic steatosis 01/21/2018  . GAD (generalized anxiety disorder) 12/29/2017  . Left shoulder pain 12/29/2017  . Elevated hemoglobin ([place]) 05/29/2017  . Fatigue 11/25/2016  . IUD contraception 11/25/2016  . 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 B12 deficiency 11/23/2016  . 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 Essential hypertension 01/03/2015  . Long-term use of high-risk medication 09/09/2014  . Polyarthritis of multiple sites 02/21/2014  . Obesity (BMI 35.0-39.9 without comorbidity) 02/21/2014   Social History   Tobacco Use  . Smoking status: Never Smoker  . Smokeless tobacco: Never Used  Substance Use Topics  . Alcohol use: Yes    Comment: occasioally   Review of Systems   Constitutional: Negative for chills, fatigue and fever.  HENT: Negative for congestion, ear pain, sinus pain and sore throat.   Eyes: Negative.   Respiratory: Negative for cough, shortness of breath and wheezing.   Cardiovascular: Negative for chest pain, palpitations and leg swelling.  Gastrointestinal: Negative for abdominal pain, diarrhea, nausea and vomiting.  Genitourinary: Negative for dysuria, frequency and urgency.  Musculoskeletal: Negative for arthralgias and myalgias.  Skin: boil on left breast, surrounding skin red, tender Neurological: Negative for syncope, light-headedness and headaches.    Psychiatric/Behavioral: The patient is not nervous/anxious.       Objective:   Physical Exam Constitutional:      General: She is in acute distress.  HENT:     Head: Normocephalic and atraumatic.     Mouth/Throat:     Mouth: Mucous membranes are moist.  Eyes:     General: No scleral icterus.    Extraocular Movements: Extraocular movements intact.     Conjunctiva/sclera: Conjunctivae normal.  Neck:     Musculoskeletal: Normal range of motion and neck supple.  Cardiovascular:     Rate and Rhythm: Normal rate and regular rhythm.  Pulmonary:     Effort: Pulmonary effort is normal. No respiratory distress.     Breath sounds: Normal breath sounds.  Chest:       Comments: Very tender, red skin with open area about the size of a dime with tunneling under skin draining yellow/white discharge. Wound culture collected and sent to lab.  Musculoskeletal:        General: No swelling.     Comments: Gait normal  Skin:    General: Skin is warm and dry.     Findings: Erythema (left breast) present.  Neurological:     Mental Status: She is alert and oriented to person, place, and time.  Psychiatric:        Mood and Affect: Mood normal.        Behavior: Behavior normal.     Vitals:   10/31/18 1025  BP: 130/78  Pulse: 100  Temp: 98.4 F (36.9 C)  SpO2: 97%  Assessment & Plan:   Cellulitis/abscess of left female breast: IM rocephin 1 gram in clinic x1 NOW  IM toradol 60 mg in clinic x1 NOW  Continue keflex as prescribed 500 mg QID and add Bactrim DS BID  Area is quite tender and wound does tunnel, I was able to put full wound culture q-tip head in wound  Patient concerned due to area worsening even with antibiotics. Area is very tender, open and draining. Discussed options and patient will go to ER to see if IV antibiotics can be given.   She will follow up here on 11/04/18 for recheck of area.

## 2018-10-31 NOTE — ED Triage Notes (Addendum)
A&O, ambulatory. Thinks she could have been bit by spider to L breast. States swelling. States possible abscess or ingrown hair. States PCP sent her here for IV antibiotics. States red, swollen, draining. Started Friday. Started on keflex on Tuesday. Denies fever at home. States PCP gave antibiotic and toradol IM shots before coming to ED.

## 2018-10-31 NOTE — Anesthesia Preprocedure Evaluation (Addendum)
Anesthesia Evaluation  Patient identified by MRN, date of birth, ID band Patient awake    Reviewed: Allergy & Precautions, H&P , NPO status , Patient's Chart, lab work & pertinent test results  Airway Mallampati: III       Dental  (+) Teeth Intact   Pulmonary neg pulmonary ROS,           Cardiovascular hypertension,      Neuro/Psych  Headaches, PSYCHIATRIC DISORDERS Anxiety    GI/Hepatic negative GI ROS, Neg liver ROS,   Endo/Other  negative endocrine ROS  Renal/GU      Musculoskeletal   Abdominal   Peds  Hematology negative hematology ROS (+)   Anesthesia Other Findings Past Medical History: No date: Allergy No date: Dysplasia of cervix No date: Herpes genitalia No date: Hypertension     Comment:  HX of high readings  No date: Increased BMI No date: Menstrual migraine  Past Surgical History: 2014 FEB and August: DILATION AND CURETTAGE OF UTERUS No date: MOUTH SURGERY 2011: TONSILECTOMY/ADENOIDECTOMY WITH MYRINGOTOMY; Bilateral  BMI    Body Mass Index:  35.51 kg/m      Reproductive/Obstetrics negative OB ROS                            Anesthesia Physical Anesthesia Plan  ASA: II  Anesthesia Plan: General LMA   Post-op Pain Management:    Induction:   PONV Risk Score and Plan: Ondansetron, Dexamethasone, Midazolam and Treatment may vary due to age or medical condition  Airway Management Planned:   Additional Equipment:   Intra-op Plan:   Post-operative Plan:   Informed Consent: I have reviewed the patients History and Physical, chart, labs and discussed the procedure including the risks, benefits and alternatives for the proposed anesthesia with the patient or authorized representative who has indicated his/her understanding and acceptance.   Dental Advisory Given  Plan Discussed with: Anesthesiologist, CRNA and Surgeon  Anesthesia Plan Comments:         Anesthesia Quick Evaluation

## 2018-10-31 NOTE — Consult Note (Signed)
Date of Consultation:  10/31/2018  Requesting Physician:  Fritzi Mandes, MD  Reason for Consultation:  Left breast abscess  History of Present Illness: Laura [name] is a 47 y.o. female presenting with a worsening left breast abscess.  Patient reports that she noticed what started as a pimple 4 days ago, and has quickly worsened.  Erythema started spreading and becoming more tender.  Two days ago she had an E-visit and was started on Keflex.  Today continued worsening and went to her PCP who gave her dose of IM rocephin and based on her tenderness and purulent drainage, sent her to the ED for further evaluation.  She had an ultrasound which shows a 5 cm long by 1 cm deep abscess.  I have independently viewed the patient's imaging study.  Lab workup shows a WBC of 10.6.  She has been started on IV Vancomycin and admitted to medical team.  She denies any fevers or chills at home.  Reports the erythema has spread surrounding the entire breast now.  The abscess started spontaneously draining yesterday.  Surgery was consulted for possible I&D of abscess.  Past Medical History: Past Medical History:  Diagnosis Date  . Allergy   . Dysplasia of cervix   . Herpes genitalia   . Hypertension    HX of high readings   . Increased BMI   . Menstrual migraine      Past Surgical History: Past Surgical History:  Procedure Laterality Date  . DILATION AND CURETTAGE OF UTERUS  2014 FEB and August  . MOUTH SURGERY    . TONSILECTOMY/ADENOIDECTOMY WITH MYRINGOTOMY Bilateral 2011    Home Medications: Prior to Admission medications   Medication Sig Start Date End Date Taking? Authorizing Provider  ALPRAZolam Duanne Moron) 0.5 MG tablet Take 1 tablet (0.5 mg total) by mouth at bedtime as needed for anxiety. 04/01/18  Yes Crecencio Mc, MD  cephALEXin (KEFLEX) 500 MG capsule Take 500 mg by mouth 4 (four) times daily. 10/29/18 11/08/18 Yes [provider]  cyanocobalamin (,VITAMIN B-12,) 1000 MCG/ML  injection Inject 1 mL (1,000 mcg total) into the muscle once a week. 06/11/18  Yes Crecencio Mc, MD  desvenlafaxine (PRISTIQ) 50 MG 24 hr tablet TAKE 1 TABLET BY MOUTH EVERY DAY Patient taking differently: Take 50 mg by mouth every evening.  08/02/18  Yes Crecencio Mc, MD  esomeprazole (NEXIUM) 20 MG capsule Take 20 mg by mouth daily at 12 noon.   Yes [provider]  levonorgestrel (MIRENA) 20 MCG/24HR IUD 1 each by Intrauterine route once.   Yes [provider]  losartan-hydrochlorothiazide (HYZAAR) 50-12.5 MG tablet TAKE 1 TABLET BY MOUTH EVERY DAY Patient taking differently: Take 1 tablet by mouth daily.  09/30/18  Yes Crecencio Mc, MD  phentermine (ADIPEX-P) 37.5 MG tablet Take 1 tablet (37.5 mg total) by mouth daily before breakfast. 10/10/18  Yes Crecencio Mc, MD  Syringe/Needle, Disp, (SYRINGE 3CC/25GX1") 25G X 1" 3 ML MISC Use for b12 injections 06/11/18  Yes Crecencio Mc, MD  lidocaine (XYLOCAINE) 2 % solution Use as directed 15 mLs in the mouth or throat as needed for mouth pain. 06/11/18   Crecencio Mc, MD  sulfamethoxazole-trimethoprim (BACTRIM DS,SEPTRA DS) 800-160 MG tablet Take 1 tablet by mouth 2 (two) times daily. 10/31/18   Jodelle Green, FNP    Allergies: Allergies  Allergen Reactions  . Bee Venom     Social History:  reports that she has never smoked. She has  never used smokeless tobacco. She reports current alcohol use. She reports that she does not use drugs.   Family History: Family History  Problem Relation Age of Onset  . Arthritis Mother   . Hypertension Mother   . Diabetes Mother   . Heart disease Father   . Hyperlipidemia Father   . Hypertension Father   . Cancer Neg Hx   . Breast cancer Neg Hx     Review of Systems: Review of Systems  Constitutional: Negative for chills and fever.  HENT: Negative for hearing loss.   Eyes: Negative for blurred vision.  Respiratory: Negative for shortness of breath.    Cardiovascular: Negative for chest pain.  Gastrointestinal: Negative for abdominal pain, nausea and vomiting.  Genitourinary: Negative for dysuria.  Musculoskeletal: Negative for myalgias.  Skin:       Left breast abscess and erythema  Neurological: Negative for dizziness.  Psychiatric/Behavioral: Negative for depression.    Physical Exam BP 139/74 (BP Location: Right Arm)   Pulse 76   Temp 98.4 F (36.9 C) (Oral)   Resp 16   Ht 5\' 6"  (1.676 m)   Wt 99.8 kg   SpO2 96%   BMI 35.51 kg/m  CONSTITUTIONAL: No acute distress HEENT:  Normocephalic, atraumatic, extraocular motion intact. NECK: Trachea is midline, and there is no jugular venous distension. RESPIRATORY:  Lungs are clear, and breath sounds are equal bilaterally. Normal respiratory effort without pathologic use of accessory muscles. CARDIOVASCULAR: Heart is regular without murmurs, gallops, or rubs. BREAST:  Left breast with a 1.5 cm opening with purulent drainage and fibrinous material consistent with abscess.  There is significant induration of the skin around the opening about 5 cm expansion.  The skin surrounding the opening also shows some ulceration.  There is erythema involving the entirely of the left breast.  Very tender to palpation. GI: The abdomen is soft, nondistended, nontender.  MUSCULOSKELETAL:  Normal muscle strength and tone in all four extremities.  No peripheral edema or cyanosis. SKIN: Skin turgor is normal. There are no pathologic skin lesions.  NEUROLOGIC:  Motor and sensation is grossly normal.  Cranial nerves are grossly intact. PSYCH:  Alert and oriented to person, place and time. Affect is normal.  Laboratory Analysis: Results for orders placed or performed during the hospital encounter of 10/31/18 (from the past 24 hour(s))  Comprehensive metabolic panel     Status: Abnormal   Collection Time: 10/31/18 11:37 AM  Result Value Ref Range   Sodium 138 135 - 145 mmol/L   Potassium 3.4 (L) 3.5 - 5.1  mmol/L   Chloride 103 98 - 111 mmol/L   CO2 26 22 - 32 mmol/L   Glucose, Bld 114 (H) 70 - 99 mg/dL   BUN 13 6 - 20 mg/dL   Creatinine, Ser 0.75 0.44 - 1.00 mg/dL   Calcium 8.8 (L) 8.9 - 10.3 mg/dL   Total Protein 7.2 6.5 - 8.1 g/dL   Albumin 3.9 3.5 - 5.0 g/dL   AST 21 15 - 41 U/L   ALT 22 0 - 44 U/L   Alkaline Phosphatase 50 38 - 126 U/L   Total Bilirubin 0.9 0.3 - 1.2 mg/dL   GFR calc non Af Amer >60 >60 mL/min   GFR calc Af Amer >60 >60 mL/min   Anion gap 9 5 - 15  CBC with Differential     Status: Abnormal   Collection Time: 10/31/18 11:37 AM  Result Value Ref Range   WBC 10.6 (H) 4.0 -  10.5 K/uL   RBC 4.81 3.87 - 5.11 MIL/uL   Hemoglobin 14.8 12.0 - 15.0 g/dL   HCT 44.8 36.0 - 46.0 %   MCV 93.1 80.0 - 100.0 fL   MCH 30.8 26.0 - 34.0 pg   MCHC 33.0 30.0 - 36.0 g/dL   RDW 12.6 11.5 - 15.5 %   Platelets 258 150 - 400 K/uL   nRBC 0.0 0.0 - 0.2 %   Neutrophils Relative % 71 %   Neutro Abs 7.5 1.7 - 7.7 K/uL   Lymphocytes Relative 22 %   Lymphs Abs 2.3 0.7 - 4.0 K/uL   Monocytes Relative 6 %   Monocytes Absolute 0.6 0.1 - 1.0 K/uL   Eosinophils Relative 1 %   Eosinophils Absolute 0.1 0.0 - 0.5 K/uL   Basophils Relative 0 %   Basophils Absolute 0.0 0.0 - 0.1 K/uL   Immature Granulocytes 0 %   Abs Immature Granulocytes 0.04 0.00 - 0.07 K/uL    Imaging: US Breast Ltd Uni Left Inc Axilla  Result Date: 10/31/2018 CLINICAL DATA:  LEFT breast open wound.  Evaluate size of abscess. EXAM: ULTRASOUND OF THE LEFT BREAST COMPARISON:  Previous exam(s). FINDINGS: On physical exam, there is a draining wound within the upper LEFT breast, 1 o'clock axis region, with surrounding erythema. Targeted ultrasound is performed, showing a complex collection which appears to be confined to the skin of the upper LEFT breast, measuring 5.5 cm in length by 1 cm depth. No obvious extension of the collection into the underlying breast tissues. IMPRESSION: Complex collection that appears to be confined  to the skin of the upper LEFT breast, measuring 5.5 cm in length and 1 cm depth. RECOMMENDATION: 1. Clinical follow-up. 2. Annual screening mammograms. Per our records, patient will be due for annual bilateral screening mammogram at the end of this month. Findings discussed with the emergency room physician at the conclusion of the exam. I have discussed the findings and recommendations with the patient. Results were also provided in writing at the conclusion of the visit. If applicable, a reminder letter will be sent to the patient regarding the next appointment. BI-RADS CATEGORY  2: Benign. Electronically Signed   By: Franki Cabot M.D.   On: 10/31/2018 14:26    Assessment and Plan: This is a 47 y.o. female with a worsening left breast abscess.  After examining the patient in the ED, she is too tender to try to do an I&D at bedside.  Thus, we will do her I&D in the OR tonight.  She will be added on for I&D of left breast abscess.  She is currently getting IV vancomycin.  She'll remain NPO with IV fluid hydration and on IV antibiotics.  We'll obtain a new culture of the purulent fluid in the OR.  Discussed with her the risks of bleeding, infection, injury to surrounding structures and need for further procedures.  She's willing to proceed.  Discussed also that she should be in hospital for at least 24 hours of IV abx and more likely 48 hours based on how much the erythema is spreading.  Then would transition to oral antibiotics based on cultures.  She will need wet to dry dressing changes post-op.    Face-to-face time spent with the patient and care providers was 80 minutes, with more than 50% of the time spent counseling, educating, and coordinating care of the patient.     Melvyn Neth, MD [facility] Pg:  [phone]

## 2018-10-31 NOTE — ED Notes (Signed)
Dr. Piscoya at bedside  

## 2018-10-31 NOTE — Progress Notes (Signed)
[name] NAME: Laura [name]    MR#:  [id number]  DATE OF BIRTH:  31-Aug-1971  DATE OF ADMISSION:  10/31/2018  PRIMARY CARE PHYSICIAN: Crecencio Mc, MD   REQUESTING/REFERRING PHYSICIAN: Dr. Clearnce Hasten  CHIEF COMPLAINT:   Left breast infection, pain, worsening redness and drainage as two days HISTORY OF PRESENT ILLNESS:  Laura [name]  is a 47 y.o. female with a known history of hypertension, of migraines comes to the emergency room accompanied by husband and father with worsening redness drainage from left breast abscess. Patient said she started noticing some discomfort and redness around the left breast on Tuesday. She got Keflex prescribed through [facility] E visit. Continue to get worse saw her nurse practitioner today who started on Bactrim DS BID. She was however feeling very uncomfortable with significant pain and lot of drainage and decided to come to the emergency room.  Ultrasound shows 5 cm complex fluid collection on the left breast. She is very tender. IV vancomycin is initiated. I discussed with Dr. Hampton Abbot to see patient in the ER.  PAST MEDICAL HISTORY:   Past Medical History:  Diagnosis Date  . Allergy   . Dysplasia of cervix   . Herpes genitalia   . Hypertension    HX of high readings   . Increased BMI   . Menstrual migraine     PAST SURGICAL HISTOIRY:   Past Surgical History:  Procedure Laterality Date  . DILATION AND CURETTAGE OF UTERUS  2014 FEB and August  . MOUTH SURGERY    . TONSILECTOMY/ADENOIDECTOMY WITH MYRINGOTOMY Bilateral 2011    SOCIAL HISTORY:   Social History   Tobacco Use  . Smoking status: Never Smoker  . Smokeless tobacco: Never Used  Substance Use Topics  . Alcohol use: Yes    Comment: occasioally    FAMILY HISTORY:   Family History  Problem Relation Age of Onset  . Arthritis Mother   . Hypertension Mother   . Diabetes Mother   . Heart disease Father   . Hyperlipidemia  Father   . Hypertension Father   . Cancer Neg Hx   . Breast cancer Neg Hx     DRUG ALLERGIES:   Allergies  Allergen Reactions  . Bee Venom     REVIEW OF SYSTEMS:  Review of Systems  Constitutional: Negative for chills, fever and weight loss.  HENT: Negative for ear discharge, ear pain and nosebleeds.   Eyes: Negative for blurred vision, pain and discharge.  Respiratory: Negative for sputum production, shortness of breath, wheezing and stridor.   Cardiovascular: Negative for chest pain, palpitations, orthopnea and PND.  Gastrointestinal: Negative for abdominal pain, diarrhea, nausea and vomiting.  Genitourinary: Negative for frequency and urgency.       Left breast redness pain swelling and drainage from an abscess  Musculoskeletal: Negative for back pain and joint pain.  Neurological: Negative for sensory change, speech change, focal weakness and weakness.  Psychiatric/Behavioral: Negative for depression and hallucinations. The patient is not nervous/anxious.      MEDICATIONS AT HOME:   Prior to Admission medications   Medication Sig Start Date End Date Taking? Authorizing Provider  ALPRAZolam Duanne Moron) 0.5 MG tablet Take 1 tablet (0.5 mg total) by mouth at bedtime as needed for anxiety. 04/01/18   Crecencio Mc, MD  cyanocobalamin (,VITAMIN B-12,) 1000 MCG/ML injection Inject 1 mL (1,000 mcg total) into the muscle once a week. 06/11/18   Crecencio Mc,  MD  desvenlafaxine (PRISTIQ) 50 MG 24 hr tablet TAKE 1 TABLET BY MOUTH EVERY DAY 08/02/18   Crecencio Mc, MD  esomeprazole (NEXIUM) 20 MG capsule Take 20 mg by mouth daily at 12 noon.    [provider]  levonorgestrel (MIRENA) 20 MCG/24HR IUD 1 each by Intrauterine route once.    [provider]  lidocaine (XYLOCAINE) 2 % solution Use as directed 15 mLs in the mouth or throat as needed for mouth pain. 06/11/18   Crecencio Mc, MD  losartan-hydrochlorothiazide (HYZAAR) 50-12.5 MG tablet TAKE 1 TABLET BY MOUTH  EVERY DAY 09/30/18   Crecencio Mc, MD  phentermine (ADIPEX-P) 37.5 MG tablet Take 1 tablet (37.5 mg total) by mouth daily before breakfast. 10/10/18   Crecencio Mc, MD  sulfamethoxazole-trimethoprim (BACTRIM DS,SEPTRA DS) 800-160 MG tablet Take 1 tablet by mouth 2 (two) times daily. 10/31/18   Jodelle Green, FNP  Syringe/Needle, Disp, (SYRINGE 3CC/25GX1") 25G X 1" 3 ML MISC Use for b12 injections 06/11/18   Crecencio Mc, MD      VITAL SIGNS:  Blood pressure (!) 161/89, pulse 80, temperature 98 F (36.7 C), temperature source Oral, resp. rate 16, height 5\' 6"  (1.676 m), weight 99.8 kg, SpO2 100 %.  PHYSICAL EXAMINATION:  GENERAL:  47 y.o.-year-old patient lying in the bed with no acute distress.  EYES: Pupils equal, round, reactive to light and accommodation. No scleral icterus. Extraocular muscles intact.  HEENT: Head atraumatic, normocephalic. Oropharynx and nasopharynx clear.  NECK:  Supple, no jugular venous distention. No thyroid enlargement, no tenderness.  LUNGS: Normal breath sounds bilaterally, no wheezing, rales,rhonchi or crepitation. No use of accessory muscles of respiration.  Left breast abscess with significant drainage redness tenderness. Around 5 cm area on the left outer part of the breast. Skin marking done. CARDIOVASCULAR: S1, S2 normal. No murmurs, rubs, or gallops.  ABDOMEN: Soft, nontender, nondistended. Bowel sounds present. No organomegaly or mass.  EXTREMITIES: No pedal edema, cyanosis, or clubbing.  NEUROLOGIC: Cranial nerves II through XII are intact. Muscle strength 5/5 in all extremities. Sensation intact. Gait not checked.  PSYCHIATRIC: The patient is alert and oriented x 3.  SKIN: No obvious rash, lesion, or ulcer.   LABORATORY PANEL:   CBC Recent Labs  Lab 10/31/18 1137  WBC 10.6*  HGB 14.8  HCT 44.8  PLT 258   ------------------------------------------------------------------------------------------------------------------  Chemistries   Recent Labs  Lab 10/31/18 1137  NA 138  K 3.4*  CL 103  CO2 26  GLUCOSE 114*  BUN 13  CREATININE 0.75  CALCIUM 8.8*  AST 21  ALT 22  ALKPHOS 50  BILITOT 0.9   ------------------------------------------------------------------------------------------------------------------  Cardiac Enzymes No results for input(s): TROPONINI in the last 168 hours. ------------------------------------------------------------------------------------------------------------------  RADIOLOGY:  US Breast Ltd Uni Left Inc Axilla  Result Date: 10/31/2018 CLINICAL DATA:  LEFT breast open wound.  Evaluate size of abscess. EXAM: ULTRASOUND OF THE LEFT BREAST COMPARISON:  Previous exam(s). FINDINGS: On physical exam, there is a draining wound within the upper LEFT breast, 1 o'clock axis region, with surrounding erythema. Targeted ultrasound is performed, showing a complex collection which appears to be confined to the skin of the upper LEFT breast, measuring 5.5 cm in length by 1 cm depth. No obvious extension of the collection into the underlying breast tissues. IMPRESSION: Complex collection that appears to be confined to the skin of the upper LEFT breast, measuring 5.5 cm in length and 1 cm depth. RECOMMENDATION: 1. Clinical follow-up. 2.  Annual screening mammograms. Per our records, patient will be due for annual bilateral screening mammogram at the end of this month. Findings discussed with the emergency room physician at the conclusion of the exam. I have discussed the findings and recommendations with the patient. Results were also provided in writing at the conclusion of the visit. If applicable, a reminder letter will be sent to the patient regarding the next appointment. BI-RADS CATEGORY  2: Benign. Electronically Signed   By: Franki Cabot M.D.   On: 10/31/2018 14:26    EKG:    IMPRESSION AND PLAN:   Laura [name]  is a 47 y.o. female with a known history of hypertension, of migraines comes to the  emergency room accompanied by husband and father with worsening redness drainage from left breast abscess. Patient said she started noticing some discomfort and redness around the left breast on Tuesday.  1. Left breast Abscess -admit patient to surgery floor. Case discussed with Dr. Hampton Abbot who will come see patient in the ER -patient will need IND. -Continue IV vancomycin -PRN IV and oral pain meds  2. Hypertension continue home meds  3. DVT prophylaxis subcu Lovenox   All the records are reviewed and case discussed with ED provider. Management plans discussed with the patient, family and they are in agreement.  CODE STATUS: full  TOTAL TIME TAKING CARE OF THIS PATIENT: *45* minutes.    Fritzi Mandes M.D on 10/31/2018 at 3:51 PM  Between 7am to 6pm - Pager - [phone]  After 6pm go to [email or web address] - password [email or web address] [facility]  [facility]  Office  [phone]  CC: Primary care physician; Crecencio Mc, MD  Patient ID: Laurel Dimmer, female   DOB: 1971-01-30, 47 y.o.   MRN: [id number]

## 2018-10-31 NOTE — Telephone Encounter (Signed)
Pt. Reports she has a "boil" on her left breast - on the top towards the left. Not under her arm. Had an e-visit with [facility] and was started on Keflex Monday. The area has become worse and is draining.Reports the breast is reddened and warm to touch. No fever. Appointment made by agent for this morning.  Reason for Disposition . Boil > 2 inches across (> 5 cm; larger than a golf ball or ping pong ball)  Answer Assessment - Initial Assessment Questions 1. APPEARANCE of BOIL: "What does the boil look like?"      Red,black and white 2. LOCATION: "Where is the boil located?"      Left breast - at the top of the breast 3. NUMBER: "How many boils are there?"      1 4. SIZE: "How big is the boil?" (e.g., inches, cm; compare to size of a coin or other object)     50 cent piece 5. ONSET: "When did the boil start?"     Monday 6. PAIN: "Is there any pain?" If so, ask: "How bad is the pain?"   (Scale 1-10; or mild, moderate, severe)     8 7. FEVER: "Do you have a fever?" If so, ask: "What is it, how was it measured, and when did it start?"      No 8. SOURCE: "Have you been around anyone with boils or other Staph infections?" "Have you ever had boils before?"     No 9. OTHER SYMPTOMS: "Do you have any other symptoms?" (e.g., shaking chills, weakness, rash elsewhere on body)     No 10. PREGNANCY: "Is there any chance you are pregnant?" "When was your last menstrual period?"       No  Protocols used: BOIL (SKIN ABSCESS)-A-AH

## 2018-10-31 NOTE — Anesthesia Procedure Notes (Signed)
Procedure Name: LMA Insertion Performed by: Pascuala Klutts, CRNA Pre-anesthesia Checklist: Patient identified, Patient being monitored, Timeout performed, Emergency Drugs available and Suction available Patient Re-evaluated:Patient Re-evaluated prior to induction Oxygen Delivery Method: Circle system utilized Preoxygenation: Pre-oxygenation with 100% oxygen Induction Type: IV induction Ventilation: Mask ventilation without difficulty LMA: LMA inserted LMA Size: 3.5 Tube type: Oral Number of attempts: 1 Placement Confirmation: positive ETCO2 and breath sounds checked- equal and bilateral Tube secured with: Tape Dental Injury: Teeth and Oropharynx as per pre-operative assessment        

## 2018-10-31 NOTE — ED Provider Notes (Signed)
[facility] Emergency Department Provider Note  ____________________________________________   None    (approximate)  I have reviewed the triage vital signs and the nursing notes.   HISTORY  Chief Complaint Cellulitis   HPI Laura [name] is a 47 y.o. female presents to the ED with abscess to her left breast.  Patient states that she done a visit earlier in the week and was prescribed Keflex which she has taken 4 times daily for the last 2 days.  Today she saw her PCP where they obtained a culture and gave her a Rocephin injection.  After that it was decided that she should come to the ED for IV antibiotics.  Patient denies any known fever and chills.  Currently she denies any nausea.  She states that the area on her breast became large quickly in the last 48 hours.  Is now opened and draining.  She denies any previous history with MRSA.  She rates her pain as an 8 out of 10.   Past Medical History:  Diagnosis Date  . Allergy   . Dysplasia of cervix   . Herpes genitalia   . Hypertension    HX of high readings   . Increased BMI   . Menstrual migraine     Patient Active Problem List   Diagnosis Date Noted  . H/O oral aphthous ulcers 06/11/2018  . Hepatic steatosis 01/21/2018  . GAD (generalized anxiety disorder) 12/29/2017  . Left shoulder pain 12/29/2017  . Elevated hemoglobin ([place]) 05/29/2017  . Fatigue 11/25/2016  . IUD contraception 11/25/2016  . 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 B12 deficiency 11/23/2016  . 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 Essential hypertension 01/03/2015  . Long-term use of high-risk medication 09/09/2014  . Polyarthritis of multiple sites 02/21/2014  . Obesity (BMI 35.0-39.9 without comorbidity) 02/21/2014    Past Surgical History:  Procedure Laterality Date  . DILATION AND CURETTAGE OF UTERUS  2014 FEB and August  . MOUTH SURGERY    .  TONSILECTOMY/ADENOIDECTOMY WITH MYRINGOTOMY Bilateral 2011    Prior to Admission medications   Medication Sig Start Date End Date Taking? Authorizing Provider  ALPRAZolam Duanne Moron) 0.5 MG tablet Take 1 tablet (0.5 mg total) by mouth at bedtime as needed for anxiety. 04/01/18   Crecencio Mc, MD  cyanocobalamin (,VITAMIN B-12,) 1000 MCG/ML injection Inject 1 mL (1,000 mcg total) into the muscle once a week. 06/11/18   Crecencio Mc, MD  desvenlafaxine (PRISTIQ) 50 MG 24 hr tablet TAKE 1 TABLET BY MOUTH EVERY DAY 08/02/18   Crecencio Mc, MD  esomeprazole (NEXIUM) 20 MG capsule Take 20 mg by mouth daily at 12 noon.    [provider]  levonorgestrel (MIRENA) 20 MCG/24HR IUD 1 each by Intrauterine route once.    [provider]  lidocaine (XYLOCAINE) 2 % solution Use as directed 15 mLs in the mouth or throat as needed for mouth pain. 06/11/18   Crecencio Mc, MD  losartan-hydrochlorothiazide (HYZAAR) 50-12.5 MG tablet TAKE 1 TABLET BY MOUTH EVERY DAY 09/30/18   Crecencio Mc, MD  phentermine (ADIPEX-P) 37.5 MG tablet Take 1 tablet (37.5 mg total) by mouth daily before breakfast. 10/10/18   Crecencio Mc, MD  sulfamethoxazole-trimethoprim (BACTRIM DS,SEPTRA DS) 800-160 MG tablet Take 1 tablet by mouth 2 (two) times daily. 10/31/18   Jodelle Green, FNP  Syringe/Needle, Disp, (SYRINGE 3CC/25GX1") 25G X 1" 3 ML MISC Use  for b12 injections 06/11/18   Crecencio Mc, MD    Allergies Bee venom  Family History  Problem Relation Age of Onset  . Arthritis Mother   . Hypertension Mother   . Diabetes Mother   . Heart disease Father   . Hyperlipidemia Father   . Hypertension Father   . Cancer Neg Hx   . Breast cancer Neg Hx     Social History Social History   Tobacco Use  . Smoking status: Never Smoker  . Smokeless tobacco: Never Used  Substance Use Topics  . Alcohol use: Yes    Comment: occasioally  . Drug use: No    Review of Systems Constitutional: No  fever/chills Cardiovascular: Denies chest pain. Respiratory: Denies shortness of breath. Gastrointestinal:   No nausea, no vomiting.  Musculoskeletal: Negative for back pain. Skin: Positive erythema and drainage from the left breast. Neurological: Negative for headaches, focal weakness or numbness. ___________________________________________   PHYSICAL EXAM:  VITAL SIGNS: ED Triage Vitals  Enc Vitals Group     BP 10/31/18 1134 (!) 161/89     Pulse Rate 10/31/18 1134 80     Resp 10/31/18 1134 16     Temp 10/31/18 1134 98 F (36.7 C)     Temp Source 10/31/18 1134 Oral     SpO2 10/31/18 1134 100 %     Weight 10/31/18 1136 220 lb (99.8 kg)     Height 10/31/18 1136 5' 6"  (1.676 m)     Head Circumference --      Peak Flow --      Pain Score 10/31/18 1135 8     Pain Loc --      Pain Edu? --      Excl. in [place]? --    Constitutional: Alert and oriented. Well appearing and in no acute distress. Eyes: Conjunctivae are normal.  Head: Atraumatic. Neck: No stridor.   Cardiovascular: Normal rate, regular rhythm. Grossly normal heart sounds.  Good peripheral circulation. Respiratory: Normal respiratory effort.  No retractions. Lungs CTAB. Gastrointestinal: Soft and nontender. No distention.  Musculoskeletal: Moves upper and lower extremities without any difficulty.  No joint swelling present.  Patient is ambulatory without any assistance. Neurologic:  Normal speech and language. No gross focal neurologic deficits are appreciated. No gait instability. Skin:  Skin is warm.  Left breast lateral upper aspect there is a large erythematous area with abscess formation lateral aspect.   Area is open and actively draining.  Surrounding cellulitis is present.  Markedly tender to palpation. Psychiatric: Mood and affect are normal. Speech and behavior are normal.  ____________________________________________   LABS (all labs ordered are listed, but only abnormal results are displayed)  Labs  Reviewed  COMPREHENSIVE METABOLIC PANEL - Abnormal; Notable for the following components:      Result Value   Potassium 3.4 (*)    Glucose, Bld 114 (*)    Calcium 8.8 (*)    All other components within normal limits  CBC WITH DIFFERENTIAL/PLATELET - Abnormal; Notable for the following components:   WBC 10.6 (*)    All other components within normal limits  AEROBIC/ANAEROBIC CULTURE (SURGICAL/DEEP WOUND)    RADIOLOGY  Official radiology report(s): US Breast Ltd Uni Left Inc Axilla  Result Date: 10/31/2018 CLINICAL DATA:  LEFT breast open wound.  Evaluate size of abscess. EXAM: ULTRASOUND OF THE LEFT BREAST COMPARISON:  Previous exam(s). FINDINGS: On physical exam, there is a draining wound within the upper LEFT breast, 1 o'clock axis region, with surrounding  erythema. Targeted ultrasound is performed, showing a complex collection which appears to be confined to the skin of the upper LEFT breast, measuring 5.5 cm in length by 1 cm depth. No obvious extension of the collection into the underlying breast tissues. IMPRESSION: Complex collection that appears to be confined to the skin of the upper LEFT breast, measuring 5.5 cm in length and 1 cm depth. RECOMMENDATION: 1. Clinical follow-up. 2. Annual screening mammograms. Per our records, patient will be due for annual bilateral screening mammogram at the end of this month. Findings discussed with the emergency room physician at the conclusion of the exam. I have discussed the findings and recommendations with the patient. Results were also provided in writing at the conclusion of the visit. If applicable, a reminder letter will be sent to the patient regarding the next appointment. BI-RADS CATEGORY  2: Benign. Electronically Signed   By: Franki Cabot M.D.   On: 10/31/2018 14:26    ____________________________________________   PROCEDURES  Procedure(s) performed: None  Procedures  Critical Care performed:  No  ____________________________________________   INITIAL IMPRESSION / ASSESSMENT AND PLAN / ED COURSE  As part of my medical decision making, I reviewed the following data within the electronic MEDICAL RECORD NUMBER Notes from prior ED visits and [place] Controlled Substance Database  ----------------------------------------- 3:49 PM on 10/31/2018 -----------------------------------------  Discussed patient with Dr. Clearnce Hasten who came back to the ED to evaluate patient also.  Patient has been on 2 days of Keflex 500 mg 4 times daily without any relief or improvement.  There is a large cellulitic area on the left breast with an open abscess that is draining.  Left breast is swollen in comparison to the right and moderately tender to palpation.  Patient is to be admitted for hospital IV antibiotics as she had failed on kit flex as an outpatient.  White count slightly elevated at 10.6.  IV vancomycin was ordered for the patient.  Patient is nontoxic and stable at this time. ____________________________________________   FINAL CLINICAL IMPRESSION(S) / ED DIAGNOSES  Final diagnoses:  Cellulitis of female breast  Abscess of breast, left     ED Discharge Orders    None       Note:  This document was prepared using Dragon voice recognition software and may include unintentional dictation errors.    Johnn Hai, PA-C 10/31/18 [place], Randall An, MD 11/01/18 [id number]

## 2018-10-31 NOTE — Op Note (Signed)
  Procedure Date:  10/31/2018  Pre-operative Diagnosis:  Left breast abscess  Post-operative Diagnosis:  Left breast abscess  Procedure:  Incision and Drainage of left breast abscess  Surgeon:  Melvyn Neth, MD  Anesthesia:  General endotracheal  Estimated Blood Loss:  10 ml  Specimens:  Culture swab  Complications:  None  Indications for Procedure:  This is a 47 y.o. female with diagnosis of left breast abscess, requiring drainage procedure.  The risks of bleeding, abscess or infection, injury to surrounding structures, and need for further procedures were all discussed with the patient and was willing to proceed.  Description of Procedure: The patient was correctly identified in the preoperative area and brought into the operating room.  The patient was placed supine with VTE prophylaxis in place.  Appropriate time-outs were performed.  Anesthesia was induced and the patient was intubated.  Appropriate antibiotics were infused.  The patient's left breast was prepped and draped in usual sterile fashion.   The abscess had already been spontaneously draining via a 2 cm opening and the cavity was accessed via its opening and culture swabs were obtained of the purulent fluid.  Yankauer and Kelly forceps were used to dissect around the abscess tissue to open any remaining pockets of purulent fluid.  The abscess cavity extended for approximately 7 cm in size.  The skin edges were necrotic and had to be sharply debrided.  After drainage was completed, the cavity was irrigated and cleaned.  Local anesthetic was infused intradermally.  The wound was packed with 4x4 gauze and covered with dry gauze and tape.  The patient was emerged from anesthesia, extubated, and brought to the recovery room for further management.  The patient tolerated the procedure well and all counts were correct at the end of the case.   Melvyn Neth, MD

## 2018-11-01 ENCOUNTER — Encounter: Payer: Self-pay | Admitting: Surgery

## 2018-11-01 MED ORDER — DOCUSATE SODIUM 100 MG PO CAPS
100.0000 mg | ORAL_CAPSULE | Freq: Every day | ORAL | Status: DC | PRN
  Administered 2018-11-01: 100 mg via ORAL
  Filled 2018-11-01: qty 1

## 2018-11-01 MED ORDER — POLYETHYLENE GLYCOL 3350 17 G PO PACK
17.0000 g | PACK | Freq: Every day | ORAL | Status: DC
  Administered 2018-11-01 – 2018-11-03 (×3): 17 g via ORAL
  Filled 2018-11-01 (×3): qty 1

## 2018-11-01 MED ORDER — SODIUM CHLORIDE 0.9 % IV SOLN
INTRAVENOUS | Status: DC | PRN
  Administered 2018-11-01: 250 mL via INTRAVENOUS

## 2018-11-01 MED ORDER — POTASSIUM CHLORIDE CRYS ER 20 MEQ PO TBCR
40.0000 meq | EXTENDED_RELEASE_TABLET | Freq: Once | ORAL | Status: AC
  Administered 2018-11-01: 40 meq via ORAL
  Filled 2018-11-01: qty 2

## 2018-11-01 NOTE — Progress Notes (Signed)
[facility] Day(s): 1.   Post op day(s): 1 Day Post-Op.   Interval History: Patient seen and examined, no acute events or new complaints overnight. Patient reports significant improvement in her pain in the left breast. No complaints of fever or chills. Has not tried to eat post-operative because "she was just excited to sleep." Plans for mobilization and formal dressing change this afternoon. No additional complaints.   Review of Systems:  Constitutional: denies fever, chills  Respiratory: denies any shortness of breath  Cardiovascular: denies chest pain or palpitations  Gastrointestinal: denies abdominal pain, N/V, or diarrhea/and bowel function as per interval history Musculoskeletal: denies pain, decreased motor or sensation Integumentary: denies any other rashes or skin discolorations except left breast abscess  Vital signs in last 24 hours: [min-max] current  Temp:  [97.1 F (36.2 C)-98.4 F (36.9 C)] 98.4 F (36.9 C) (12/13 0506) Pulse Rate:  [68-100] 72 (12/13 0506) Resp:  [9-19] 16 (12/13 0506) BP: (105-161)/(61-89) 123/63 (12/13 0506) SpO2:  [93 %-100 %] 96 % (12/13 0506) Weight:  [99.8 kg-100.7 kg] 99.8 kg (12/12 1136)     Height: 5\' 6"  (167.6 cm) Weight: 99.8 kg BMI (Calculated): 35.53   Intake/Output this shift:  No intake/output data recorded.     Physical Exam:  Constitutional: alert, cooperative and no distress  Respiratory: breathing non-labored at rest  Integumentary: Left breast erythema significantly improved, non-tender, dressing in place in the upper outer breast quadrant.   Labs:  CBC Latest Ref Rng & Units 10/31/2018 09/18/2018 05/28/2017  WBC 4.0 - 10.5 K/uL 10.6(H) 6.9 5.7  Hemoglobin 12.0 - 15.0 g/dL 14.8 15.0 15.4(H)  Hematocrit 36.0 - 46.0 % 44.8 44.1 44.9  Platelets 150 - 400 K/uL 258 251.0 270.0   CMP Latest Ref Rng & Units 10/31/2018 09/18/2018 04/01/2018  Glucose 70 - 99 mg/dL 114(H) 100(H)  89  BUN 6 - 20 mg/dL 13 10 10   Creatinine 0.44 - 1.00 mg/dL 0.75 0.97 0.83  Sodium 135 - 145 mmol/L 138 138 140  Potassium 3.5 - 5.1 mmol/L 3.4(L) 3.7 3.8  Chloride 98 - 111 mmol/L 103 100 100  CO2 22 - 32 mmol/L 26 31 32  Calcium 8.9 - 10.3 mg/dL 8.8(L) 9.0 8.9  Total Protein 6.5 - 8.1 g/dL 7.2 6.7 6.6  Total Bilirubin 0.3 - 1.2 mg/dL 0.9 1.1 0.7  Alkaline Phos 38 - 126 U/L 50 44 51  AST 15 - 41 U/L 21 23 22   ALT 0 - 44 U/L 22 30 29      Imaging studies: No new pertinent imaging studies   Assessment/Plan:  47 y.o. female who is overall doing well 1 Day Post-Op s/p Incision and drainage for a left breast abscess, complicated by pertinent comorbidities including HTN and obesity.   - Clear liquids this morning, advance diet as tolerated   - Continue IV ABx (Vancomycin) for a total of 48 hours  - Pain control as needed   - Will plan for formal dressing change this afternoon   - Mobilize   - Medical management of comorbidities  All of the above findings and recommendations were discussed with the patient, patient's family, and the medical team, and all of patient's and her family's questions were answered to their expressed satisfaction.  -- Edison Simon, PA-C [facility] 11/01/2018, 8:41 AM [phone] M-F: 7am - 4pm

## 2018-11-01 NOTE — Progress Notes (Addendum)
[place] at [place] NAME: Laura [name]    MR#:  [id number]  DATE OF BIRTH:  09-05-1971  SUBJECTIVE: Patient is here for left breast abscess status post incision and drainage by surgery yesterday.  Patient does have dressing to the left breast, does have some pain in the left breast.  He has been having lightheaded follow-up infection for last couple of days which has progressed and started to bother her a lot also for redness, found to have abscess which was drained by surgery last night.  CHIEF COMPLAINT:   Chief Complaint  Patient presents with  . Cellulitis   Patient is right now on IV vancomycin. REVIEW OF SYSTEMS:   ROS CONSTITUTIONAL: No fever, fatigue or weakness.  EYES: No blurred or double vision.  EARS, NOSE, AND THROAT: No tinnitus or ear pain.  RESPIRATORY: No cough, shortness of breath, wheezing or hemoptysis.  CARDIOVASCULAR: No chest pain, orthopnea, edema.  GASTROINTESTINAL: No nausea, vomiting, diarrhea or abdominal pain.  GENITOURINARY: No dysuria, hematuria.  ENDOCRINE: No polyuria, nocturia,  HEMATOLOGY: No anemia, easy bruising or bleeding SKIN: No rash or lesion. MUSCULOSKELETAL: No joint pain or arthritis.   NEUROLOGIC: No tingling, numbness, weakness.  PSYCHIATRY: No anxiety or depression.  dressing present for the left breast. DRUG ALLERGIES:   Allergies  Allergen Reactions  . Bee Venom     VITALS:  Blood pressure 123/63, pulse 72, temperature 98.4 F (36.9 C), temperature source Oral, resp. rate 16, height 5\' 6"  (1.676 m), weight 99.8 kg, SpO2 96 %.  PHYSICAL EXAMINATION:  GENERAL:  47 y.o.-year-old patient lying in the bed with no acute distress.  EYES: Pupils equal, round, reactive to light and accommodation. No scleral icterus. Extraocular muscles intact.  HEENT: Head atraumatic, normocephalic. Oropharynx and nasopharynx clear.  NECK:  Supple, no jugular venous distention. No thyroid enlargement,  no tenderness.  LUNGS: Normal breath sounds bilaterally, no wheezing, rales,rhonchi or crepitation. No use of accessory muscles of respiration.  CARDIOVASCULAR: S1, S2 normal. No murmurs, rubs, or gallops.  ABDOMEN: Soft, nontender, nondistended. Bowel sounds present. No organomegaly or mass.  EXTREMITIES: No pedal edema, cyanosis, or clubbing.  NEUROLOGIC: Cranial nerves II through XII are intact. Muscle strength 5/5 in all extremities. Sensation intact. Gait not checked.  PSYCHIATRIC: The patient is alert and oriented x 3.  SKIN: \Left leg dressing in place, there is a left upper thigh edema significantly improved, nontender, dressing present in the left outer quadrant of left breast.  LABORATORY PANEL:   CBC Recent Labs  Lab 10/31/18 1137  WBC 10.6*  HGB 14.8  HCT 44.8  PLT 258   ------------------------------------------------------------------------------------------------------------------  Chemistries  Recent Labs  Lab 10/31/18 1137  NA 138  K 3.4*  CL 103  CO2 26  GLUCOSE 114*  BUN 13  CREATININE 0.75  CALCIUM 8.8*  AST 21  ALT 22  ALKPHOS 50  BILITOT 0.9   ------------------------------------------------------------------------------------------------------------------  Cardiac Enzymes No results for input(s): TROPONINI in the last 168 hours. ------------------------------------------------------------------------------------------------------------------  RADIOLOGY:  US Breast Ltd Uni Left Inc Axilla  Result Date: 10/31/2018 CLINICAL DATA:  LEFT breast open wound.  Evaluate size of abscess. EXAM: ULTRASOUND OF THE LEFT BREAST COMPARISON:  Previous exam(s). FINDINGS: On physical exam, there is a draining wound within the upper LEFT breast, 1 o'clock axis region, with surrounding erythema. Targeted ultrasound is performed, showing a complex collection which appears to be confined to the skin of the upper LEFT breast, measuring 5.5  cm in length by 1 cm depth.  No obvious extension of the collection into the underlying breast tissues. IMPRESSION: Complex collection that appears to be confined to the skin of the upper LEFT breast, measuring 5.5 cm in length and 1 cm depth. RECOMMENDATION: 1. Clinical follow-up. 2. Annual screening mammograms. Per our records, patient will be due for annual bilateral screening mammogram at the end of this month. Findings discussed with the emergency room physician at the conclusion of the exam. I have discussed the findings and recommendations with the patient. Results were also provided in writing at the conclusion of the visit. If applicable, a reminder letter will be sent to the patient regarding the next appointment. BI-RADS CATEGORY  2: Benign. Electronically Signed   By: Laura [name] M.D.   On: 10/31/2018 14:26    EKG:  No orders found for this or any previous visit.  ASSESSMENT AND PLAN:   Left breast abscess status post incision and drainage by surgery yesterday,  Continue IV vancomycin, follow intraoperative cultures, patient CBC unremarkable.  Watch closely tonight, continue dressing changes, continue ambulate, surgery to follow and see if she can go home tomorrow with oral antibiotics.  Continue IV vancomycin, IV pain medicines today, continue dressing changes to the left breast, follow clinical course see I  2.  Essential hypertension, controlled, continue home medicines. #3 .mild hypokalemia: Replace the potassium. Discussed with patient's husband.  All the records are reviewed and case discussed with Care Management/Social Workerr. Management plans discussed with the patient, family and they are in agreement.  CODE STATUS: full  TOTAL TIME TAKING CARE OF THIS PATIENT: 38 minutes.  More than 50% time spent in counseling, coordination of care POSSIBLE D/C IN 1-2 DAYS, DEPENDING ON CLINICAL CONDITION.   Epifanio Lesches M.D on 11/01/2018 at 11:45 AM  Between 7am to 6pm - Pager - [phone]  After  6pm go to [email or web address] - password [email or web address] [facility]  Office  [phone]  CC: Primary care physician; Crecencio Mc, MD   Note: This dictation was prepared with Dragon dictation along with smaller phrase technology. Any transcriptional errors that result from this process are unintentional.

## 2018-11-02 LAB — CREATININE, SERUM
Creatinine, Ser: 0.82 mg/dL (ref 0.44–1.00)
GFR calc Af Amer: >60 mL/min (ref >60)
GFR calc non Af Amer: >60 mL/min (ref >60)

## 2018-11-02 LAB — VANCOMYCIN, TROUGH: Vancomycin Tr: 12 ug/mL — ABNORMAL LOW (ref 15–20)

## 2018-11-02 MED ORDER — LACTULOSE 10 GM/15ML PO SOLN
20.0000 g | Freq: Two times a day (BID) | ORAL | Status: DC | PRN
  Administered 2018-11-02: 20 g via ORAL
  Filled 2018-11-02: qty 30

## 2018-11-02 MED ORDER — VANCOMYCIN HCL 10 G IV SOLR
1250.0000 mg | Freq: Three times a day (TID) | INTRAVENOUS | Status: DC
  Administered 2018-11-02 – 2018-11-03 (×3): 1250 mg via INTRAVENOUS
  Filled 2018-11-02 (×7): qty 1250

## 2018-11-02 MED ORDER — BISACODYL 5 MG PO TBEC
10.0000 mg | DELAYED_RELEASE_TABLET | Freq: Every day | ORAL | Status: DC
  Filled 2018-11-02: qty 2

## 2018-11-02 NOTE — Progress Notes (Signed)
[facility] Hospital Day(s): 2.   Post op day(s): 2 Days Post-Op.   Interval History: Patient seen and examined, no acute events or new complaints overnight. Patient reports having significant pain on the left breast in need of IV pain medication. Pain is worse than yesterday.   Vital signs in last 24 hours: [min-max] current  Temp:  [97.7 F (36.5 C)-98.2 F (36.8 C)] 98.2 F (36.8 C) (12/14 1201) Pulse Rate:  [61-90] 61 (12/14 1201) Resp:  [16-20] 18 (12/14 1201) BP: (115-155)/(62-81) 155/81 (12/14 1201) SpO2:  [95 %-97 %] 95 % (12/14 1201)     Height: 5\' 6"  (167.6 cm) Weight: 99.8 kg BMI (Calculated): 35.53   Physical Exam:  Constitutional: alert, cooperative and no distress  Breast: left breast with cellulitis, no significant change from yesterday. Borders of wound are necrotic but no secretions. No purulent drainage, no recollection of pus.   Labs:  CBC Latest Ref Rng & Units 10/31/2018 09/18/2018 05/28/2017  WBC 4.0 - 10.5 K/uL 10.6(H) 6.9 5.7  Hemoglobin 12.0 - 15.0 g/dL 14.8 15.0 15.4(H)  Hematocrit 36.0 - 46.0 % 44.8 44.1 44.9  Platelets 150 - 400 K/uL 258 251.0 270.0   CMP Latest Ref Rng & Units 11/02/2018 10/31/2018 09/18/2018  Glucose 70 - 99 mg/dL - 114(H) 100(H)  BUN 6 - 20 mg/dL - 13 10  Creatinine 0.44 - 1.00 mg/dL 0.82 0.75 0.97  Sodium 135 - 145 mmol/L - 138 138  Potassium 3.5 - 5.1 mmol/L - 3.4(L) 3.7  Chloride 98 - 111 mmol/L - 103 100  CO2 22 - 32 mmol/L - 26 31  Calcium 8.9 - 10.3 mg/dL - 8.8(L) 9.0  Total Protein 6.5 - 8.1 g/dL - 7.2 6.7  Total Bilirubin 0.3 - 1.2 mg/dL - 0.9 1.1  Alkaline Phos 38 - 126 U/L - 50 44  AST 15 - 41 U/L - 21 23  ALT 0 - 44 U/L - 22 30    Imaging studies: No new pertinent imaging studies   Assessment/Plan:  47 y.o. female with left breast abscess status post cleansing and debridement. Patient without significant improvement since yesterday and still significantly tender. Local care needed to be with IV  pain medication and patient will not tolerate this dressing changes at home yet due to pain. Since the erythema does not improve I consider that still needs IV antibiotics. Dressing changes with wet to dry dressing placed.   Arnold Long, MD

## 2018-11-02 NOTE — Progress Notes (Signed)
[place] at [place] NAME: Laura [name]    MR#:  [id number]  DATE OF BIRTH:  [birth year]  SUBJECTIVE:  CHIEF COMPLAINT:   Chief Complaint  Patient presents with  . Cellulitis   -Breast wound is healing better, packing in place.  No fevers or chills  REVIEW OF SYSTEMS:  Review of Systems  Constitutional: Negative for chills, fever and malaise/fatigue.  HENT: Negative for congestion, ear discharge, hearing loss and nosebleeds.   Eyes: Negative for blurred vision and double vision.  Respiratory: Negative for cough, shortness of breath and wheezing.   Cardiovascular: Negative for chest pain and palpitations.  Gastrointestinal: Positive for constipation. Negative for abdominal pain, diarrhea, nausea and vomiting.  Genitourinary: Negative for dysuria.  Musculoskeletal: Negative for myalgias.  Neurological: Negative for dizziness, focal weakness, seizures, weakness and headaches.  Psychiatric/Behavioral: Negative for depression.    DRUG ALLERGIES:   Allergies  Allergen Reactions  . Bee Venom     VITALS:  Blood pressure (!) 155/81, pulse 61, temperature 98.2 F (36.8 C), temperature source Oral, resp. rate 18, height 5\' 6"  (1.676 m), weight 99.8 kg, SpO2 95 %.  PHYSICAL EXAMINATION:  Physical Exam   GENERAL:  47 y.o.-year-old patient lying in the bed with no acute distress.  EYES: Pupils equal, round, reactive to light and accommodation. No scleral icterus. Extraocular muscles intact.  HEENT: Head atraumatic, normocephalic. Oropharynx and nasopharynx clear.  NECK:  Supple, no jugular venous distention. No thyroid enlargement, no tenderness.  LUNGS: Normal breath sounds bilaterally, no wheezing, rales,rhonchi or crepitation. No use of accessory muscles of respiration.  CARDIOVASCULAR: S1, S2 normal. No murmurs, rubs, or gallops. BREASTS-right breast is normal.  Left breast in the superolateral area there is a wound with packing present.   When packing removed, good granulation tissue and healing edges noted. Some erythema and firmness around the wound ABDOMEN: Soft, nontender, nondistended. Bowel sounds present. No organomegaly or mass.  EXTREMITIES: No pedal edema, cyanosis, or clubbing.  NEUROLOGIC: Cranial nerves II through XII are intact. Muscle strength 5/5 in all extremities. Sensation intact. Gait not checked.  PSYCHIATRIC: The patient is alert and oriented x 3.  SKIN: No obvious rash, lesion, or ulcer.    LABORATORY PANEL:   CBC Recent Labs  Lab 10/31/18 1137  WBC 10.6*  HGB 14.8  HCT 44.8  PLT 258   ------------------------------------------------------------------------------------------------------------------  Chemistries  Recent Labs  Lab 10/31/18 1137 11/02/18 0504  NA 138  --   K 3.4*  --   CL 103  --   CO2 26  --   GLUCOSE 114*  --   BUN 13  --   CREATININE 0.75 0.82  CALCIUM 8.8*  --   AST 21  --   ALT 22  --   ALKPHOS 50  --   BILITOT 0.9  --    ------------------------------------------------------------------------------------------------------------------  Cardiac Enzymes No results for input(s): TROPONINI in the last 168 hours. ------------------------------------------------------------------------------------------------------------------  RADIOLOGY:  No results found.  EKG:  No orders found for this or any previous visit.  ASSESSMENT AND PLAN:   47 year old female with history of hypertension and migraines comes to ER secondary to left breast abscess and cellulitis.  1. Left breast abscess and cellulitis-appreciate surgical consult -Status post I&D, cultures are negative as antibiotics were started prior to I&D -Currently on vancomycin and responding well. -Continue IV antibiotics for 1 more day and pain medications  2.  Hypertension-on losartan and hydrochlorothiazide  3.  GERD-Protonix  4.  DVT prophylaxis-Lovenox  5.  Constipation-meds added   All the  records are reviewed and case discussed with Care Management/Social Workerr. Management plans discussed with the patient, family and they are in agreement.  CODE STATUS: Full code  TOTAL TIME TAKING CARE OF THIS PATIENT: 38 minutes.   POSSIBLE D/C IN 1-2 DAYS, DEPENDING ON CLINICAL CONDITION.   Gladstone Lighter M.D on 11/02/2018 at 2:13 PM  Between 7am to 6pm - Pager - [phone]  After 6pm go to [email or web address] - password [email or web address] [facility]  Office  [phone]  CC: Primary care physician; Crecencio Mc, MD

## 2018-11-02 NOTE — Progress Notes (Signed)
Pharmacy Antibiotic Note  Laura [name] is a 47 y.o. female admitted on 10/31/2018 with breast cellulitis w/ abscess.  Pharmacy has been consulted for Vancomycin dosing.  Plan: Vancomycin 1250 IV every 12 hours.  Goal trough 15-20 mcg/mL. Ke 0.072  t 1/2 9.63  Vd 52.8  Adj BW= 75.5 kg  Scr 0.80 (0.75) Will check trough prior to 5th total dose on 12/14.  12/14  VT = 12 mcg/ml. Transition to Vancomycin 1g IV every 8 hours.    Recheck level on 12/15 at 11:00.    Height: 5\' 6"  (167.6 cm) Weight: 220 lb (99.8 kg) IBW/kg (Calculated) : 59.3  Temp (24hrs), Avg:98 F (36.7 C), Min:97.7 F (36.5 C), Max:98.2 F (36.8 C)  Recent Labs  Lab 10/31/18 1137 11/02/18 0504 11/02/18 1029  WBC 10.6*  --   --   CREATININE 0.75 0.82  --   VANCOTROUGH  --   --  12*    Estimated Creatinine Clearance: 101.1 mL/min (by C-G formula based on SCr of 0.82 mg/dL).    Allergies  Allergen Reactions  . Bee Venom     Antimicrobials this admission: 12/12 Vanc   >>       >>    Dose adjustments this admission:  12/14 Vanc 1g Q12H to [facility]  Microbiology results:   BCx:     UCx:      Sputum:      MRSA PCR:   Wound cx 12/12:   Thank you for allowing pharmacy to be a part of this patient's care.  Olivia Canter, [facility] 11/02/2018 11:05 AM

## 2018-11-02 NOTE — Progress Notes (Addendum)
Husband stated that he might need to perform dressing changes at home. Offered to provide education. Husband, with RN guidance and instruction, performed wet to dry dressing change tonight. He feels confident in his ability to perform this dressing change at home if needed.

## 2018-11-03 LAB — BASIC METABOLIC PANEL
ANION GAP: 5 (ref 5–15)
BUN: 11 mg/dL (ref 6–20)
CO2: 29 mmol/L (ref 22–32)
Calcium: 8 mg/dL — ABNORMAL LOW (ref 8.9–10.3)
Chloride: 106 mmol/L (ref 98–111)
Creatinine, Ser: 0.76 mg/dL (ref 0.44–1.00)
GFR calc Af Amer: >60 mL/min (ref >60)
GFR calc non Af Amer: >60 mL/min (ref >60)
Glucose, Bld: 96 mg/dL (ref 70–99)
POTASSIUM: 3.5 mmol/L (ref 3.5–5.1)
Sodium: 140 mmol/L (ref 135–145)

## 2018-11-03 LAB — CBC
HCT: 39.5 % (ref 36.0–46.0)
Hemoglobin: 12.9 g/dL (ref 12.0–15.0)
MCH: 30.6 pg (ref 26.0–34.0)
MCHC: 32.7 g/dL (ref 30.0–36.0)
MCV: 93.8 fL (ref 80.0–100.0)
NRBC: 0 % (ref 0.0–0.2)
Platelets: 267 10*3/uL (ref 150–400)
RBC: 4.21 MIL/uL (ref 3.87–5.11)
RDW: 12.6 % (ref 11.5–15.5)
WBC: 8.2 10*3/uL (ref 4.0–10.5)

## 2018-11-03 MED ORDER — BISACODYL 5 MG PO TBEC: 10.0000 mg | DELAYED_RELEASE_TABLET | Freq: Every day | ORAL | 0 refills | Status: DC | PRN

## 2018-11-03 MED ORDER — DOCUSATE SODIUM 100 MG PO CAPS: 100.0000 mg | ORAL_CAPSULE | Freq: Two times a day (BID) | ORAL | 0 refills | Status: DC

## 2018-11-03 MED ORDER — SULFAMETHOXAZOLE-TRIMETHOPRIM 800-160 MG PO TABS: 1.0000 | ORAL_TABLET | Freq: Two times a day (BID) | ORAL | 0 refills | Status: DC

## 2018-11-03 MED ORDER — OXYCODONE HCL 5 MG PO TABS: 5.0000 mg | ORAL_TABLET | ORAL | 0 refills | Status: DC | PRN

## 2018-11-03 NOTE — Progress Notes (Signed)
     Laura [name] was admitted to the [facility] on 10/31/2018 for an acute medical condition and is being Discharged on  11/03/2018 .  She had a surgical procedure done and still will need dressing changes, pain control and will not be able to attend work until 11/11/18 So please excuse  her from work for the above  Days.  Call Gladstone Lighter  MD, [facility] at  [phone] with questions.  Gladstone Lighter M.D on 11/03/2018,at 10:40 AM  [facility] [address] [place] [address]

## 2018-11-03 NOTE — Progress Notes (Signed)
[facility] Hospital Day(s): 3.   Post op day(s): 3 Days Post-Op.   Interval History: Patient seen and examined, no acute events or new complaints overnight. Patient reports her husband changed the dressing and did good. Did not needed IV pain medication.  Vital signs in last 24 hours: [min-max] current  Temp:  [98.2 F (36.8 C)] 98.2 F (36.8 C) (12/14 2131) Pulse Rate:  [61-69] 69 (12/14 2131) Resp:  [16-18] 16 (12/14 2131) BP: (136-155)/(81-90) 136/90 (12/14 2131) SpO2:  [95 %-98 %] 98 % (12/14 2131)     Height: 5\' 6"  (167.6 cm) Weight: 99.8 kg BMI (Calculated): 35.53   Physical Exam:  Constitutional: alert, cooperative and no distress  Breast: open wound with adequate healing tissue. Minimal ischemic tissue ion wound edges but no purulent secretions. Improving erythema.   Labs:  CBC Latest Ref Rng & Units 11/03/2018 10/31/2018 09/18/2018  WBC 4.0 - 10.5 K/uL 8.2 10.6(H) 6.9  Hemoglobin 12.0 - 15.0 g/dL 12.9 14.8 15.0  Hematocrit 36.0 - 46.0 % 39.5 44.8 44.1  Platelets 150 - 400 K/uL 267 258 251.0   CMP Latest Ref Rng & Units 11/03/2018 11/02/2018 10/31/2018  Glucose 70 - 99 mg/dL 96 - 114(H)  BUN 6 - 20 mg/dL 11 - 13  Creatinine 0.44 - 1.00 mg/dL 0.76 0.82 0.75  Sodium 135 - 145 mmol/L 140 - 138  Potassium 3.5 - 5.1 mmol/L 3.5 - 3.4(L)  Chloride 98 - 111 mmol/L 106 - 103  CO2 22 - 32 mmol/L 29 - 26  Calcium 8.9 - 10.3 mg/dL 8.0(L) - 8.8(L)  Total Protein 6.5 - 8.1 g/dL - - 7.2  Total Bilirubin 0.3 - 1.2 mg/dL - - 0.9  Alkaline Phos 38 - 126 U/L - - 50  AST 15 - 41 U/L - - 21  ALT 0 - 44 U/L - - 22    Imaging studies: No new pertinent imaging studies   Assessment/Plan:  47 y.o. female with left breast abscess status post cleansing and debridement. Patient today with much better pain control and improvement of erythema. Wound continue healing slowly but appropriate. No sign of re infection. From surgery standpoint, patient can be changed to oral  antibiotic and discharged home. I sent the pain medication prescription to pharmacy.   Arnold Long, MD

## 2018-11-04 ENCOUNTER — Ambulatory Visit: Payer: BC Managed Care – PPO | Admitting: Family Medicine

## 2018-11-04 LAB — WOUND CULTURE
MICRO NUMBER: 91489664
RESULT:: NO GROWTH
SPECIMEN QUALITY: ADEQUATE

## 2018-11-04 NOTE — Discharge Summary (Signed)
[facility] NAME: Laura [name]    MR#:  [id number]  DATE OF BIRTH:  12/13/1970  DATE OF ADMISSION:  10/31/2018   ADMITTING PHYSICIAN: Fritzi Mandes, MD  DATE OF DISCHARGE: 11/03/2018  4:01 PM  PRIMARY CARE PHYSICIAN: Crecencio Mc, MD   ADMISSION DIAGNOSIS:   Cellulitis of female breast [N61.0] Abscess of breast, left [N61.1]  DISCHARGE DIAGNOSIS:   Active Problems:   Abscess of breast, left   SECONDARY DIAGNOSIS:   Past Medical History:  Diagnosis Date  . Allergy   . Dysplasia of cervix   . Herpes genitalia   . Hypertension    HX of high readings   . Increased BMI   . Menstrual migraine     HOSPITAL COURSE:   47 year old female with history of hypertension and migraines comes to ER secondary to left breast abscess and cellulitis.  1. Left breast abscess and cellulitis-appreciate surgical consult -Status post I&D, cultures are negative as antibiotics were started prior to I&D -received vancomycin here-discharged on Bactrim. -Pain medications as needed  2.  Hypertension-on losartan and hydrochlorothiazide  3.  GERD-Nexium  4.  Constipation-meds added and resolved  5.  Depression-continue Pristiq  Up and ambulatory- discharge today   DISCHARGE CONDITIONS:   Guarded  CONSULTS OBTAINED:   Treatment Team:  Olean Ree, MD  DRUG ALLERGIES:   Allergies  Allergen Reactions  . Bee Venom    DISCHARGE MEDICATIONS:   Allergies as of 11/03/2018      Reactions   Bee Venom       Medication List    STOP taking these medications   cephALEXin 500 MG capsule Commonly known as:  KEFLEX   lidocaine 2 % solution Commonly known as:  XYLOCAINE     TAKE these medications   ALPRAZolam 0.5 MG tablet Commonly known as:  XANAX Take 1 tablet (0.5 mg total) by mouth at bedtime as needed for anxiety.   bisacodyl 5 MG EC tablet Commonly known as:  DULCOLAX Take 2 tablets (10 mg total) by mouth daily  as needed for mild constipation.   cyanocobalamin 1000 MCG/ML injection Commonly known as:  (VITAMIN B-12) Inject 1 mL (1,000 mcg total) into the muscle once a week.   desvenlafaxine 50 MG 24 hr tablet Commonly known as:  PRISTIQ TAKE 1 TABLET BY MOUTH EVERY DAY What changed:  when to take this   docusate sodium 100 MG capsule Commonly known as:  COLACE Take 1 capsule (100 mg total) by mouth 2 (two) times daily.   esomeprazole 20 MG capsule Commonly known as:  NEXIUM Take 20 mg by mouth daily at 12 noon.   levonorgestrel 20 MCG/24HR IUD Commonly known as:  MIRENA 1 each by Intrauterine route once.   losartan-hydrochlorothiazide 50-12.5 MG tablet Commonly known as:  HYZAAR TAKE 1 TABLET BY MOUTH EVERY DAY   oxyCODONE 5 MG immediate release tablet Commonly known as:  Oxy IR/ROXICODONE Take 1 tablet (5 mg total) by mouth every 4 (four) hours as needed for up to 7 days for moderate pain or severe pain.   phentermine 37.5 MG tablet Commonly known as:  ADIPEX-P Take 1 tablet (37.5 mg total) by mouth daily before breakfast.   sulfamethoxazole-trimethoprim 800-160 MG tablet Commonly known as:  BACTRIM DS,SEPTRA DS Take 1 tablet by mouth 2 (two) times daily for 10 days.   SYRINGE 3CC/25GX1" 25G X 1" 3 ML Misc Use for b12 injections  DISCHARGE INSTRUCTIONS:   1. PCP f/u in 1-2 weeks 2. General surgery f/u in 2 weeks  DIET:   Regular diet  ACTIVITY:   Activity as tolerated  OXYGEN:   Home Oxygen: No.  Oxygen Delivery: room air  DISCHARGE LOCATION:   home   If you experience worsening of your admission symptoms, develop shortness of breath, life threatening emergency, suicidal or homicidal thoughts you must seek medical attention immediately by calling 911 or calling your MD immediately  if symptoms less severe.  You Must read complete instructions/literature along with all the possible adverse reactions/side effects for all the Medicines you take and  that have been prescribed to you. Take any new Medicines after you have completely understood and accpet all the possible adverse reactions/side effects.   Please note  You were cared for by a hospitalist during your hospital stay. If you have any questions about your discharge medications or the care you received while you were in the hospital after you are discharged, you can call the unit and asked to speak with the hospitalist on call if the hospitalist that took care of you is not available. Once you are discharged, your primary care physician will handle any further medical issues. Please note that NO REFILLS for any discharge medications will be authorized once you are discharged, as it is imperative that you return to your primary care physician (or establish a relationship with a primary care physician if you do not have one) for your aftercare needs so that they can reassess your need for medications and monitor your lab values.    On the day of Discharge:  VITAL SIGNS:   Blood pressure 136/90, pulse 69, temperature 98.2 F (36.8 C), temperature source Oral, resp. rate 16, height 5\' 6"  (1.676 m), weight 99.8 kg, SpO2 98 %.  PHYSICAL EXAMINATION:    GENERAL:  47 y.o.-year-old patient lying in the bed with no acute distress.  EYES: Pupils equal, round, reactive to light and accommodation. No scleral icterus. Extraocular muscles intact.  HEENT: Head atraumatic, normocephalic. Oropharynx and nasopharynx clear.  NECK:  Supple, no jugular venous distention. No thyroid enlargement, no tenderness.  LUNGS: Normal breath sounds bilaterally, no wheezing, rales,rhonchi or crepitation. No use of accessory muscles of respiration.  CARDIOVASCULAR: S1, S2 normal. No murmurs, rubs, or gallops. BREASTS-right breast is normal.  Left breast in the superolateral area there is a wound with packing present.  When packing removed, good granulation tissue and healing edges noted. Some erythema and firmness  around the wound ABDOMEN: Soft, nontender, nondistended. Bowel sounds present. No organomegaly or mass.  EXTREMITIES: No pedal edema, cyanosis, or clubbing.  NEUROLOGIC: Cranial nerves II through XII are intact. Muscle strength 5/5 in all extremities. Sensation intact. Gait not checked.  PSYCHIATRIC: The patient is alert and oriented x 3.  SKIN: No obvious rash, lesion, or ulcer.  DATA REVIEW:   CBC Recent Labs  Lab 11/03/18 0451  WBC 8.2  HGB 12.9  HCT 39.5  PLT 267    Chemistries  Recent Labs  Lab 10/31/18 1137  11/03/18 0451  NA 138  --  140  K 3.4*  --  3.5  CL 103  --  106  CO2 26  --  29  GLUCOSE 114*  --  96  BUN 13  --  11  CREATININE 0.75   < > 0.76  CALCIUM 8.8*  --  8.0*  AST 21  --   --   ALT 22  --   --  ALKPHOS 50  --   --   BILITOT 0.9  --   --    < > = values in this interval not displayed.     Microbiology Results  Results for orders placed or performed during the hospital encounter of 10/31/18  Aerobic/Anaerobic Culture (surgical/deep wound)     Status: None (Preliminary result)   Collection Time: 10/31/18  3:01 PM  Result Value Ref Range Status   Specimen Description   Final    WOUND Performed at [facility], [address] [place], [place] [address]    Special Requests   Final    WOUND Performed at [facility], [address] [place], [place] [address]    Gram Stain   Final    MODERATE WBC PRESENT, PREDOMINANTLY PMN NO ORGANISMS SEEN    Culture   Final    NO GROWTH 4 DAYS NO ANAEROBES ISOLATED; CULTURE IN PROGRESS FOR 5 DAYS Performed at [facility] [address] [place], [place] [address]    Report Status PENDING  Incomplete  Surgical PCR screen     Status: None   Collection Time: 10/31/18  5:59 PM  Result Value Ref Range Status   MRSA, PCR NEGATIVE NEGATIVE Final   Staphylococcus aureus NEGATIVE NEGATIVE Final    Comment: (NOTE) The Xpert SA Assay (FDA approved for NASAL specimens in patients  8 years of age and older), is one component of a comprehensive surveillance program. It is not intended to diagnose infection nor to guide or monitor treatment. Performed at [facility], [address] [place], [place] [address]   Aerobic/Anaerobic Culture (surgical/deep wound)     Status: None (Preliminary result)   Collection Time: 10/31/18  9:55 PM  Result Value Ref Range Status   Specimen Description   Final    WOUND Performed at [facility], [address], [place], [place] [address]    Special Requests   Final    NONE Performed at [facility], [place], [place] [address]    Gram Stain NO WBC SEEN NO ORGANISMS SEEN   Final   Culture   Final    NO GROWTH 3 DAYS NO ANAEROBES ISOLATED; CULTURE IN PROGRESS FOR 5 DAYS Performed at [facility] [address] [place], [place] [address]    Report Status PENDING  Incomplete    RADIOLOGY:  No results found.   Management plans discussed with the patient, family and they are in agreement.  CODE STATUS:  Code Status History    Date Active Date Inactive Code Status Order ID Comments User Context   10/31/2018 1732 11/03/2018 1906 Full Code [id number]  Fritzi Mandes, MD Inpatient      TOTAL TIME TAKING CARE OF THIS PATIENT: 38 minutes.    Gladstone Lighter M.D on 11/04/2018 at 2:31 PM  Between 7am to 6pm - Pager - [phone]  After 6pm go to [email or web address] - Proofreader  [facility]  Office  [phone]  CC: Primary care physician; Crecencio Mc, MD   Note: This dictation was prepared with Dragon dictation along with smaller phrase technology. Any transcriptional errors that result from this process are unintentional.

## 2018-11-04 NOTE — Anesthesia Postprocedure Evaluation (Signed)
Anesthesia Post Note  Patient: Laura [name]  Procedure(s) Performed: INCISION AND DRAINAGE LEFT BREAST ABSCESS (Left )  Patient location during evaluation: PACU Anesthesia Type: General Level of consciousness: awake and alert Pain management: pain level controlled Vital Signs Assessment: post-procedure vital signs reviewed and stable Respiratory status: spontaneous breathing, nonlabored ventilation, respiratory function stable and patient connected to nasal cannula oxygen Cardiovascular status: blood pressure returned to baseline and stable Postop Assessment: no apparent nausea or vomiting Anesthetic complications: no     Last Vitals:  Vitals:   11/02/18 1201 11/02/18 2131  BP: (!) 155/81 136/90  Pulse: 61 69  Resp: 18 16  Temp: 36.8 C 36.8 C  SpO2: 95% 98%    Last Pain:  Vitals:   11/03/18 0820  TempSrc:   PainSc: Jeannette

## 2018-11-05 ENCOUNTER — Other Ambulatory Visit: Payer: Self-pay

## 2018-11-05 ENCOUNTER — Encounter: Payer: Self-pay | Admitting: *Deleted

## 2018-11-05 ENCOUNTER — Encounter
Admission: RE | Disposition: A | Discharge status: Home / Self Care | Payer: Self-pay | Source: Ambulatory Visit | Attending: Surgery

## 2018-11-05 ENCOUNTER — Inpatient Hospital Stay: Payer: BC Managed Care – PPO | Admitting: Anesthesiology

## 2018-11-05 ENCOUNTER — Encounter: Payer: Self-pay | Admitting: Surgery

## 2018-11-05 ENCOUNTER — Inpatient Hospital Stay (HOSPITAL_COMMUNITY)
Admission: RE | Admit: 2018-11-05 | Discharge: 2018-11-09 | Disposition: A | Discharge status: Home / Self Care | Payer: BC Managed Care – PPO | Source: Ambulatory Visit | Attending: Surgery | Admitting: Surgery

## 2018-11-05 ENCOUNTER — Telehealth: Payer: Self-pay

## 2018-11-05 ENCOUNTER — Ambulatory Visit (INDEPENDENT_AMBULATORY_CARE_PROVIDER_SITE_OTHER): Payer: BC Managed Care – PPO | Admitting: Surgery

## 2018-11-05 ENCOUNTER — Inpatient Hospital Stay: Payer: BC Managed Care – PPO

## 2018-11-05 VITALS — BP 141/77 | HR 97 | Temp 97.7°F | Ht 66.0 in | Wt 220.6 lb

## 2018-11-05 DIAGNOSIS — M7989 Other specified soft tissue disorders: Secondary | ICD-10-CM

## 2018-11-05 HISTORY — PX: IRRIGATION AND DEBRIDEMENT ABSCESS: SHX5252

## 2018-11-05 LAB — BASIC METABOLIC PANEL
Anion gap: 7 (ref 5–15)
BUN: 9 mg/dL (ref 6–20)
CHLORIDE: 102 mmol/L (ref 98–111)
CO2: 26 mmol/L (ref 22–32)
Calcium: 8.5 mg/dL — ABNORMAL LOW (ref 8.9–10.3)
Creatinine, Ser: 0.97 mg/dL (ref 0.44–1.00)
GFR calc Af Amer: >60 mL/min (ref >60)
GFR calc non Af Amer: >60 mL/min (ref >60)
Glucose, Bld: 103 mg/dL — ABNORMAL HIGH (ref 70–99)
Potassium: 3.7 mmol/L (ref 3.5–5.1)
Sodium: 135 mmol/L (ref 135–145)

## 2018-11-05 LAB — CBC
HEMATOCRIT: 42.6 % (ref 36.0–46.0)
Hemoglobin: 14.2 g/dL (ref 12.0–15.0)
MCH: 30.8 pg (ref 26.0–34.0)
MCHC: 33.3 g/dL (ref 30.0–36.0)
MCV: 92.4 fL (ref 80.0–100.0)
Platelets: 289 10*3/uL (ref 150–400)
RBC: 4.61 MIL/uL (ref 3.87–5.11)
RDW: 12.2 % (ref 11.5–15.5)
WBC: 10.5 10*3/uL (ref 4.0–10.5)
nRBC: 0 % (ref 0.0–0.2)

## 2018-11-05 SURGERY — IRRIGATION AND DEBRIDEMENT ABSCESS
Anesthesia: General | Laterality: Left

## 2018-11-05 MED ORDER — ONDANSETRON HCL 4 MG/2ML IJ SOLN
4.0000 mg | Freq: Four times a day (QID) | INTRAMUSCULAR | Status: DC | PRN
  Administered 2018-11-07: 4 mg via INTRAVENOUS
  Filled 2018-11-05: qty 2

## 2018-11-05 MED ORDER — VANCOMYCIN HCL IN DEXTROSE 1-5 GM/200ML-% IV SOLN
1000.0000 mg | Freq: Once | INTRAVENOUS | Status: AC
  Administered 2018-11-05: 1000 mg via INTRAVENOUS
  Filled 2018-11-05: qty 200

## 2018-11-05 MED ORDER — SUCCINYLCHOLINE CHLORIDE 20 MG/ML IJ SOLN
INTRAMUSCULAR | Status: AC
  Filled 2018-11-05: qty 1

## 2018-11-05 MED ORDER — LIDOCAINE HCL (PF) 2 % IJ SOLN
INTRAMUSCULAR | Status: AC
  Filled 2018-11-05: qty 10

## 2018-11-05 MED ORDER — HEPARIN SODIUM (PORCINE) 5000 UNIT/ML IJ SOLN: 5000.0000 [IU] | Freq: Three times a day (TID) | INTRAMUSCULAR | Status: DC

## 2018-11-05 MED ORDER — IOHEXOL 300 MG/ML  SOLN
75.0000 mL | Freq: Once | INTRAMUSCULAR | Status: AC | PRN
  Administered 2018-11-05: 75 mL via INTRAVENOUS

## 2018-11-05 MED ORDER — FENTANYL CITRATE (PF) 100 MCG/2ML IJ SOLN
INTRAMUSCULAR | Status: AC
  Filled 2018-11-05: qty 2

## 2018-11-05 MED ORDER — MIDAZOLAM HCL 2 MG/2ML IJ SOLN
INTRAMUSCULAR | Status: AC
  Filled 2018-11-05: qty 2

## 2018-11-05 MED ORDER — ONDANSETRON HCL 4 MG/2ML IJ SOLN
INTRAMUSCULAR | Status: AC
  Filled 2018-11-05: qty 2

## 2018-11-05 MED ORDER — ROCURONIUM BROMIDE 50 MG/5ML IV SOLN
INTRAVENOUS | Status: AC
  Filled 2018-11-05: qty 1

## 2018-11-05 MED ORDER — PIPERACILLIN-TAZOBACTAM 3.375 G IVPB
3.3750 g | Freq: Three times a day (TID) | INTRAVENOUS | Status: DC
  Administered 2018-11-05 – 2018-11-08 (×8): 3.375 g via INTRAVENOUS
  Filled 2018-11-05 (×8): qty 50

## 2018-11-05 MED ORDER — FLUCONAZOLE 150 MG PO TABS: 150.0000 mg | ORAL_TABLET | Freq: Every day | ORAL | 0 refills | Status: DC

## 2018-11-05 MED ORDER — DEXAMETHASONE SODIUM PHOSPHATE 10 MG/ML IJ SOLN
INTRAMUSCULAR | Status: AC
  Filled 2018-11-05: qty 1

## 2018-11-05 MED ORDER — PROPOFOL 10 MG/ML IV BOLUS
INTRAVENOUS | Status: AC
  Filled 2018-11-05: qty 20

## 2018-11-05 MED ORDER — ONDANSETRON 4 MG PO TBDP: 4.0000 mg | ORAL_TABLET | Freq: Four times a day (QID) | ORAL | Status: DC | PRN

## 2018-11-05 MED ORDER — VANCOMYCIN HCL 10 G IV SOLR
1250.0000 mg | Freq: Two times a day (BID) | INTRAVENOUS | Status: DC
  Administered 2018-11-06: 1250 mg via INTRAVENOUS
  Filled 2018-11-05 (×2): qty 1250

## 2018-11-05 MED ORDER — MORPHINE SULFATE (PF) 2 MG/ML IV SOLN
2.0000 mg | INTRAVENOUS | Status: DC | PRN
  Administered 2018-11-05: 2 mg via INTRAVENOUS
  Filled 2018-11-05: qty 1

## 2018-11-05 MED ORDER — OXYCODONE-ACETAMINOPHEN 5-325 MG PO TABS: 1.0000 | ORAL_TABLET | ORAL | Status: DC | PRN

## 2018-11-05 MED ORDER — LACTATED RINGERS IV SOLN
INTRAVENOUS | Status: DC
  Administered 2018-11-05: 19:00:00 via INTRAVENOUS

## 2018-11-05 SURGICAL SUPPLY — 26 items
BNDG GAUZE 4.5X4.1 6PLY STRL (MISCELLANEOUS) ×3 IMPLANT
CHLORAPREP W/TINT 26ML (MISCELLANEOUS) IMPLANT
CNTNR SPEC 2.5X3XGRAD LEK (MISCELLANEOUS) ×1
CONT SPEC 4OZ STER OR WHT (MISCELLANEOUS) ×2
CONTAINER SPEC 2.5X3XGRAD LEK (MISCELLANEOUS) ×1 IMPLANT
COVER WAND RF STERILE (DRAPES) IMPLANT
DRAIN PENROSE 1/4X12 LTX (DRAIN) IMPLANT
DRAPE LAPAROTOMY 100X77 ABD (DRAPES) ×3 IMPLANT
ELECT REM PT RETURN 9FT ADLT (ELECTROSURGICAL) ×3
ELECTRODE REM PT RTRN 9FT ADLT (ELECTROSURGICAL) ×1 IMPLANT
GAUZE SPONGE 4X4 12PLY STRL (GAUZE/BANDAGES/DRESSINGS) ×3 IMPLANT
GLOVE SURG SYN 7.0 (GLOVE) ×6 IMPLANT
GLOVE SURG SYN 7.5  E (GLOVE) ×4
GLOVE SURG SYN 7.5 E (GLOVE) ×2 IMPLANT
GOWN STRL REUS W/ TWL LRG LVL3 (GOWN DISPOSABLE) ×2 IMPLANT
GOWN STRL REUS W/TWL LRG LVL3 (GOWN DISPOSABLE) ×4
KIT TURNOVER KIT A (KITS) ×3 IMPLANT
LABEL OR SOLS (LABEL) IMPLANT
NS IRRIG 500ML POUR BTL (IV SOLUTION) ×3 IMPLANT
PACK BASIN MINOR ARMC (MISCELLANEOUS) ×3 IMPLANT
PAD ABD DERMACEA PRESS 5X9 (GAUZE/BANDAGES/DRESSINGS) ×3 IMPLANT
PULSAVAC PLUS IRRIG FAN TIP (DISPOSABLE) ×3
SPONGE LAP 18X18 RF (DISPOSABLE) ×3 IMPLANT
SWAB CULTURE AMIES ANAERIB BLU (MISCELLANEOUS) ×3 IMPLANT
SYR BULB 3OZ (MISCELLANEOUS) ×3 IMPLANT
TIP FAN IRRIG PULSAVAC PLUS (DISPOSABLE) ×1 IMPLANT

## 2018-11-05 NOTE — Telephone Encounter (Signed)
Placed call to direct admit [phone] [facility].  No beds cleaned at this time, however will receive a call when a room is available.    Patient to stop at [facility] registration.

## 2018-11-05 NOTE — Progress Notes (Signed)
Surgical Clinic Progress/Follow-up Note   HPI:  47 y.o. Female presents to clinic for post-op follow-up 5 Days s/p incision and drainage of a large Left breast abscess ([place], 10/31/2009). Patient reports she'd been feeling overall well and performing once daily packing/dressing changes since discharged from the hospital 2 days ago until she developed severe Left breast pain and worsened redness with fever to 100.5, sweats, and chills, denies N/V, CP, or SOB.  Review of Systems:  Constitutional: fever/chills as per interval history Respiratory: denies shortness of breath, wheezing  Cardiovascular: denies chest pain, palpitations  Breasts: pain, swelling, and erythema as per interval history Skin: Denies any other rashes or skin discolorations except post-surgical wounds as per interval history  Vital Signs:  BP (!) 141/77   Pulse 97   Temp 97.7 F (36.5 C) (Temporal)   Ht 5\' 6"  (1.676 m)   Wt 220 lb 9.6 oz (100.1 kg)   SpO2 98%   BMI 35.61 kg/m    Physical Exam:  Constitutional:  -- Obese body habitus  -- Awake, alert, and oriented x3  Pulmonary:  -- No crackles -- Equal breath sounds bilaterally -- Breathing non-labored at rest Cardiovascular:  -- S1, S2 present  -- No pericardial rubs  Breasts:  -- Left breast: exquisitely tender erythematous and edematous breast with pink healthy granulation tissue exposed at post-surgical site, but with grey-black inferior skin edge and boggy necrotic-appearing undermined several cm below this level that slides over underlying tissues without any appreciable purulent drainage for fluctuance, no nipple discharge -- Right breast: Non-tender without any appreciable masses or nipple discharge Musculoskeletal / Integumentary:  -- Wounds or skin discoloration: None appreciated except post-surgical incisions as described above (Breasts) -- Extremities: B/L UE and LE FROM, hands and feet warm   Laboratory studies:  CBC Latest Ref Rng &  Units 11/03/2018 10/31/2018 09/18/2018  WBC 4.0 - 10.5 K/uL 8.2 10.6(H) 6.9  Hemoglobin 12.0 - 15.0 g/dL 12.9 14.8 15.0  Hematocrit 36.0 - 46.0 % 39.5 44.8 44.1  Platelets 150 - 400 K/uL 267 258 251.0   CMP Latest Ref Rng & Units 11/03/2018 11/02/2018 10/31/2018  Glucose 70 - 99 mg/dL 96 - 114(H)  BUN 6 - 20 mg/dL 11 - 13  Creatinine 0.44 - 1.00 mg/dL 0.76 0.82 0.75  Sodium 135 - 145 mmol/L 140 - 138  Potassium 3.5 - 5.1 mmol/L 3.5 - 3.4(L)  Chloride 98 - 111 mmol/L 106 - 103  CO2 22 - 32 mmol/L 29 - 26  Calcium 8.9 - 10.3 mg/dL 8.0(L) - 8.8(L)  Total Protein 6.5 - 8.1 g/dL - - 7.2  Total Bilirubin 0.3 - 1.2 mg/dL - - 0.9  Alkaline Phos 38 - 126 U/L - - 50  AST 15 - 41 U/L - - 21  ALT 0 - 44 U/L - - 22   Imaging: No new pertinent imaging available for review  Assessment:  47 y.o. yo Female with a problem list including...  Patient Active Problem List   Diagnosis Date Noted  . Abscess of breast, left 10/31/2018  . H/O oral aphthous ulcers 06/11/2018  . Hepatic steatosis 01/21/2018  . GAD (generalized anxiety disorder) 12/29/2017  . Left shoulder pain 12/29/2017  . Elevated hemoglobin ([place]) 05/29/2017  . Fatigue 11/25/2016  . IUD contraception 11/25/2016  . 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 B12 deficiency 11/23/2016  . 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 Essential hypertension 01/03/2015  . Long-term use of high-risk  medication 09/09/2014  . Polyarthritis of multiple sites 02/21/2014  . Obesity (BMI 35.0-39.9 without comorbidity) 02/21/2014    presents to clinic for what appears to be Left breast soft tissue and skin necrosis of infectious vs ischemic etiology 5 Days s/p incision and drainage of a large Left breast abscess ([place], 10/31/2009).  Plan:   - discussed with Dr. Hampton Abbot             - will direct admit to [facility] for further management, pain control             - NPO, IV antibiotics (Zosyn and  vancomycin), CT chest to evaluate for abscess             - patient is being added to OR schedule by Dr. Hampton Abbot for Left breast debridement and possible further incision and drainage  - medical management of comorbidities  All of the above recommendations were discussed with the patient and patient's husband, and all of patient's and family's questions were answered to their expressed satisfaction.  -- Marilynne Drivers Rosana Hoes, MD, [place]: [facility] - Partnering for exceptional care. Office: [phone]

## 2018-11-05 NOTE — Patient Instructions (Addendum)
Patient will be directly admitted to the hospital from the office.

## 2018-11-05 NOTE — Anesthesia Preprocedure Evaluation (Signed)
Anesthesia Evaluation  Patient identified by MRN, date of birth, ID band Patient awake    Reviewed: Allergy & Precautions, NPO status , Patient's Chart, lab work & pertinent test results  History of Anesthesia Complications Negative for: history of anesthetic complications  Airway Mallampati: II       Dental   Pulmonary neg sleep apnea, neg COPD,           Cardiovascular hypertension, Pt. on medications (-) Past MI and (-) CHF (-) dysrhythmias (-) Valvular Problems/Murmurs     Neuro/Psych neg Seizures Anxiety    GI/Hepatic Neg liver ROS, neg GERD  ,  Endo/Other  neg diabetes  Renal/GU negative Renal ROS     Musculoskeletal   Abdominal   Peds  Hematology   Anesthesia Other Findings   Reproductive/Obstetrics                             Anesthesia Physical Anesthesia Plan  ASA: II  Anesthesia Plan: General   Post-op Pain Management:    Induction: Intravenous  PONV Risk Score and Plan: 3 and Ondansetron, Midazolam and Treatment may vary due to age or medical condition  Airway Management Planned: LMA  Additional Equipment:   Intra-op Plan:   Post-operative Plan:   Informed Consent: I have reviewed the patients History and Physical, chart, labs and discussed the procedure including the risks, benefits and alternatives for the proposed anesthesia with the patient or authorized representative who has indicated his/her understanding and acceptance.     Plan Discussed with:   Anesthesia Plan Comments:         Anesthesia Quick Evaluation

## 2018-11-05 NOTE — Consult Note (Signed)
Pharmacy Antibiotic Note  Laura [name] is a 47 y.o. female admitted on 11/05/2018 with cellulitis.  Pharmacy has been consulted for Zosyn and Vancomycin dosing.  Plan: Vancomycin 1000 IV once followed by 6 hour stacked dosing vancomycin 1250 mg IV every 12 hours.  Goal trough 10-15 mcg/mL.  Will draw trough level prior to the fifth dose.   Zosyn 3.375g IV q8h (4 hour infusion).     Temp (24hrs), Avg:97.7 F (36.5 C), Min:97.7 F (36.5 C), Max:97.7 F (36.5 C)  Recent Labs  Lab 10/31/18 1137 11/02/18 0504 11/02/18 1029 11/03/18 0451  WBC 10.6*  --   --  8.2  CREATININE 0.75 0.82  --  0.76  VANCOTROUGH  --   --  12*  --     Estimated Creatinine Clearance: 103.8 mL/min (by C-G formula based on SCr of 0.76 mg/dL).    Allergies  Allergen Reactions  . Bee Venom     Antimicrobials this admission: Zosyn 12/17 >>  Vanco 12/17 >>   Dose adjustments this admission:   Microbiology results:  BCx:   UCx:    Sputum:    MRSA PCR:   Thank you for allowing pharmacy to be a part of this patient's care.  Forrest Moron, PharmD Clinical Pharmacist 11/05/2018 5:57 PM

## 2018-11-05 NOTE — H&P (Signed)
Date of Admission:  11/05/2018  Reason for Admission:  Worsening left breast pain  History of Present Illness: Laura [name] is a 47 y.o. female s/p I&D of left breast abscess on 10/31/18.  She was discharged to home on 12/15 on Bactrim DS.  She had been doing well, doing wet to dry dressing changes at home.  However, today she presented to the office today because of worsening pain in the left breast, associated with low grade fever, sweats, and chills.  She denies any chest pain, shortness of breath, nausea, or vomiting.  Denies any purulent drainage from breast wound, but the redness and swelling have not improved.  Of note, the patient has also developed a rash on her back that started yesterday and per her husband, has spread up her back and to her sides.  Past Medical History: Past Medical History:  Diagnosis Date  . Allergy   . Dysplasia of cervix   . Herpes genitalia   . Hypertension    HX of high readings   . Increased BMI   . Menstrual migraine      Past Surgical History: Past Surgical History:  Procedure Laterality Date  . DILATION AND CURETTAGE OF UTERUS  2014 FEB and August  . INCISION AND DRAINAGE ABSCESS Left 10/31/2018   Procedure: INCISION AND DRAINAGE LEFT BREAST ABSCESS;  Surgeon: Olean Ree, MD;  Location: [facility] ORS;  Service: General;  Laterality: Left;  . MOUTH SURGERY    . TONSILECTOMY/ADENOIDECTOMY WITH MYRINGOTOMY Bilateral 2011    Home Medications: Prior to Admission medications   Medication Sig Start Date End Date Taking? Authorizing Provider  ALPRAZolam Duanne Moron) 0.5 MG tablet Take 1 tablet (0.5 mg total) by mouth at bedtime as needed for anxiety. 04/01/18   Crecencio Mc, MD  bisacodyl (DULCOLAX) 5 MG EC tablet Take 2 tablets (10 mg total) by mouth daily as needed for mild constipation. 11/03/18   Gladstone Lighter, MD  cyanocobalamin (,VITAMIN B-12,) 1000 MCG/ML injection Inject 1 mL (1,000 mcg total) into the muscle once a week. 06/11/18    Crecencio Mc, MD  desvenlafaxine (PRISTIQ) 50 MG 24 hr tablet TAKE 1 TABLET BY MOUTH EVERY DAY Patient taking differently: Take 50 mg by mouth every evening.  08/02/18   Crecencio Mc, MD  docusate sodium (COLACE) 100 MG capsule Take 1 capsule (100 mg total) by mouth 2 (two) times daily. 11/03/18   Gladstone Lighter, MD  esomeprazole (NEXIUM) 20 MG capsule Take 20 mg by mouth daily at 12 noon.    [provider]  fluconazole (DIFLUCAN) 150 MG tablet Take 1 tablet (150 mg total) by mouth daily. 11/05/18   Olean Ree, MD  levonorgestrel (MIRENA) 20 MCG/24HR IUD 1 each by Intrauterine route once.    [provider]  losartan-hydrochlorothiazide (HYZAAR) 50-12.5 MG tablet TAKE 1 TABLET BY MOUTH EVERY DAY Patient taking differently: Take 1 tablet by mouth daily.  09/30/18   Crecencio Mc, MD  oxyCODONE (OXY IR/ROXICODONE) 5 MG immediate release tablet Take 1 tablet (5 mg total) by mouth every 4 (four) hours as needed for up to 7 days for moderate pain or severe pain. 11/03/18 11/10/18  Gladstone Lighter, MD  phentermine (ADIPEX-P) 37.5 MG tablet Take 1 tablet (37.5 mg total) by mouth daily before breakfast. 10/10/18   Crecencio Mc, MD  sulfamethoxazole-trimethoprim (BACTRIM DS,SEPTRA DS) 800-160 MG tablet Take 1 tablet by mouth 2 (two) times daily for 10 days. 11/03/18 11/13/18  Gladstone Lighter, MD  Syringe/Needle,  Disp, (SYRINGE 3CC/25GX1") 25G X 1" 3 ML MISC Use for b12 injections 06/11/18   Crecencio Mc, MD    Allergies: Allergies  Allergen Reactions  . Bee Venom     Social History:  reports that she has never smoked. She has never used smokeless tobacco. She reports current alcohol use. She reports that she does not use drugs.   Family History: Family History  Problem Relation Age of Onset  . Arthritis Mother   . Hypertension Mother   . Diabetes Mother   . Heart disease Father   . Hyperlipidemia Father   . Hypertension Father   . Cancer Neg Hx   .  Breast cancer Neg Hx     Review of Systems: Review of Systems  Constitutional: Positive for chills and fever.  HENT: Negative for hearing loss.   Eyes: Negative for blurred vision.  Respiratory: Negative for shortness of breath.   Cardiovascular: Negative for chest pain.  Gastrointestinal: Negative for abdominal pain, nausea and vomiting.  Genitourinary: Negative for dysuria.  Musculoskeletal: Negative for myalgias.  Skin:       Worsening erythema and swelling of left breast  Neurological: Negative for dizziness.  Psychiatric/Behavioral: Negative for depression.    Physical Exam BP (!) 146/92 (BP Location: Left Arm)   Pulse 89   Temp 99.2 F (37.3 C) (Oral)   Resp 18   Ht 5\' 6"  (1.676 m)   Wt 100.1 kg   SpO2 98%   BMI 35.60 kg/m  CONSTITUTIONAL: No acute distress HEENT:  Normocephalic, atraumatic, extraocular motion intact. NECK: Trachea is midline, and there is no jugular venous distension.  RESPIRATORY:  Lungs are clear, and breath sounds are equal bilaterally. Normal respiratory effort without pathologic use of accessory muscles. CARDIOVASCULAR: Heart is regular without murmurs, gallops, or rubs. BREAST:  Left breast with I&D site with necrotic skin edges, and necrosis of skin progressing inferiorly and laterally over the left breast.  There is erythema and induration.  No purulent drainage. GI: The abdomen is soft, nondistended, nontender. There were no palpable masses. There was no hepatosplenomegaly. MUSCULOSKELETAL:  Normal muscle strength and tone in all four extremities.  No peripheral edema or cyanosis. SKIN: Skin turgor is normal. There are no pathologic skin lesions.  NEUROLOGIC:  Motor and sensation is grossly normal.  Cranial nerves are grossly intact. PSYCH:  Alert and oriented to person, place and time. Affect is normal.  Laboratory Analysis: Results for orders placed or performed during the hospital encounter of 11/05/18 (from the past 24 hour(s))  Basic  metabolic panel     Status: Abnormal   Collection Time: 11/05/18  5:43 PM  Result Value Ref Range   Sodium 135 135 - 145 mmol/L   Potassium 3.7 3.5 - 5.1 mmol/L   Chloride 102 98 - 111 mmol/L   CO2 26 22 - 32 mmol/L   Glucose, Bld 103 (H) 70 - 99 mg/dL   BUN 9 6 - 20 mg/dL   Creatinine, Ser 0.97 0.44 - 1.00 mg/dL   Calcium 8.5 (L) 8.9 - 10.3 mg/dL   GFR calc non Af Amer >60 >60 mL/min   GFR calc Af Amer >60 >60 mL/min   Anion gap 7 5 - 15  CBC     Status: None   Collection Time: 11/05/18  5:43 PM  Result Value Ref Range   WBC 10.5 4.0 - 10.5 K/uL   RBC 4.61 3.87 - 5.11 MIL/uL   Hemoglobin 14.2 12.0 - 15.0 g/dL  HCT 42.6 36.0 - 46.0 %   MCV 92.4 80.0 - 100.0 fL   MCH 30.8 26.0 - 34.0 pg   MCHC 33.3 30.0 - 36.0 g/dL   RDW 12.2 11.5 - 15.5 %   Platelets 289 150 - 400 K/uL   nRBC 0.0 0.0 - 0.2 %    Imaging: Ct Chest W Contrast  Result Date: 11/05/2018 CLINICAL DATA:  Status post I & D of large left breast abscess 5 days prior. Patient presents with left chest wall pain, erythema and fever with sweats and chills. EXAM: CT CHEST WITH CONTRAST TECHNIQUE: Multidetector CT imaging of the chest was performed during intravenous contrast administration. CONTRAST:  34mL OMNIPAQUE IOHEXOL 300 MG/ML  SOLN COMPARISON:  None. FINDINGS: Cardiovascular: Normal heart size. No significant pericardial effusion/thickening. Great vessels are normal in course and caliber. No central pulmonary emboli. Mediastinum/Nodes: No discrete thyroid nodules. Unremarkable esophagus. Mild left axillary adenopathy measuring up to 1.1 cm (series 2/image 48). No right axillary adenopathy. No pathologically enlarged mediastinal or hilar nodes. Lungs/Pleura: No pneumothorax. No pleural effusion. No acute consolidative airspace disease or lung masses. Peripheral right lower lobe 4 mm solid pulmonary nodule (series 3/image 70). No additional significant pulmonary nodules. Upper abdomen: No acute abnormality. Musculoskeletal:  No aggressive appearing focal osseous lesions. Minimal thoracic spondylosis. Prominent asymmetric skin thickening throughout the left breast. No fluid collections in the chest wall. IMPRESSION: 1. Nonspecific prominent asymmetric skin thickening throughout the left breast. No chest wall fluid collections. 2. Nonspecific mild left axillary lymphadenopathy. 3. Solitary 4 mm solid right lower lobe pulmonary nodule. No follow-up needed if patient is low-risk. Non-contrast chest CT can be considered in 12 months if patient is high-risk. This recommendation follows the consensus statement: Guidelines for Management of Incidental Pulmonary Nodules Detected on CT Images:From the Fleischner Society 2017; published online before print (10.1148/[id number]). Electronically Signed   By: Ilona Sorrel M.D.   On: 11/05/2018 19:09    Assessment and Plan: This is a 47 y.o. female s/p I&D of left breast abscess, with worsening pain, persistent erythema, and with skin necrosis overlying the abscess cavity.  I have independently viewed the patient's imaging study and reviewed her laboratory studies.  Her CT scan does not show any new abscess or recurrent abscess, but the skin is very thickened consistent with her cellulitis and necrosis.  There is an enlarged left axillary lymph node that is likely reactive.  Discussed with Dr. Rosana Hoes who saw the patient in the office today and agreed to admit the patient today to the surgical team.  Given the skin findings and worsening pain, as well as low grade fever, will need to take her back to the OR for debridement of necrotic tissue and drainage of any fluid.  Discussed with the patient that this would involve a bigger wound as we cannot leave behind any necrotic tissue.  She may also require further trips to the OR for further debridement or washout.  Patient understands this plan and she's willing to proceed.  All of her questions have been answered.   Melvyn Neth,  MD [facility] Pg:  [phone]

## 2018-11-05 NOTE — Progress Notes (Unsigned)
MyChart message Dr Hampton Abbot...the antibiotics have started giving me a yeast infection, as I expected would happen. Would you send a Rx I for me for that?  Also, I've developed an itchy rash on my back...my husband put some hydrocortisone cream on it for me last night. Is this anything I should be concerned about? Lastly, last night I seemed to have a low grade fever...99.8-100.4. This morning it's 99.8.  I'm taking pain medicine every four hours...the area is still very swollen and painful. Maybe I'm expecting too much too soon.  Thank you for your time.  Dr Hampton Abbot said we could send in Petroleum to take one tablet and he wants to see you tomorrow at 11:00 in the office to check the area of concern, will that work for you?

## 2018-11-06 ENCOUNTER — Encounter: Payer: Self-pay | Admitting: Surgery

## 2018-11-06 ENCOUNTER — Ambulatory Visit: Payer: Self-pay | Admitting: Surgery

## 2018-11-06 DIAGNOSIS — M7989 Other specified soft tissue disorders: Secondary | ICD-10-CM

## 2018-11-06 LAB — AEROBIC/ANAEROBIC CULTURE W GRAM STAIN (SURGICAL/DEEP WOUND)
Culture: NO GROWTH
Culture: NO GROWTH
Gram Stain: NONE SEEN

## 2018-11-06 LAB — AEROBIC/ANAEROBIC CULTURE (SURGICAL/DEEP WOUND)

## 2018-11-06 LAB — BASIC METABOLIC PANEL
Anion gap: 7 (ref 5–15)
BUN: 10 mg/dL (ref 6–20)
CO2: 25 mmol/L (ref 22–32)
Calcium: 8.4 mg/dL — ABNORMAL LOW (ref 8.9–10.3)
Chloride: 104 mmol/L (ref 98–111)
Creatinine, Ser: 0.84 mg/dL (ref 0.44–1.00)
GFR calc Af Amer: >60 mL/min (ref >60)
GFR calc non Af Amer: >60 mL/min (ref >60)
Glucose, Bld: 117 mg/dL — ABNORMAL HIGH (ref 70–99)
Potassium: 3.7 mmol/L (ref 3.5–5.1)
Sodium: 136 mmol/L (ref 135–145)

## 2018-11-06 LAB — CBC WITH DIFFERENTIAL/PLATELET
Abs Immature Granulocytes: 0.09 10*3/uL — ABNORMAL HIGH (ref 0.00–0.07)
Basophils Absolute: 0 10*3/uL (ref 0.0–0.1)
Basophils Relative: 0 %
Eosinophils Absolute: 0.1 10*3/uL (ref 0.0–0.5)
Eosinophils Relative: 1 %
HCT: 40.9 % (ref 36.0–46.0)
Hemoglobin: 13.6 g/dL (ref 12.0–15.0)
Immature Granulocytes: 1 %
Lymphocytes Relative: 20 %
Lymphs Abs: 2.1 10*3/uL (ref 0.7–4.0)
MCH: 30.8 pg (ref 26.0–34.0)
MCHC: 33.3 g/dL (ref 30.0–36.0)
MCV: 92.7 fL (ref 80.0–100.0)
Monocytes Absolute: 0.8 10*3/uL (ref 0.1–1.0)
Monocytes Relative: 8 %
Neutro Abs: 7.2 10*3/uL (ref 1.7–7.7)
Neutrophils Relative %: 70 %
Platelets: 315 10*3/uL (ref 150–400)
RBC: 4.41 MIL/uL (ref 3.87–5.11)
RDW: 12.2 % (ref 11.5–15.5)
WBC: 10.3 10*3/uL (ref 4.0–10.5)
nRBC: 0 % (ref 0.0–0.2)

## 2018-11-06 LAB — GLUCOSE, CAPILLARY: Glucose-Capillary: 112 mg/dL — ABNORMAL HIGH (ref 70–99)

## 2018-11-06 MED ORDER — LIDOCAINE HCL (CARDIAC) PF 100 MG/5ML IV SOSY
PREFILLED_SYRINGE | INTRAVENOUS | Status: DC | PRN
  Administered 2018-11-06: 60 mg via INTRAVENOUS

## 2018-11-06 MED ORDER — FENTANYL CITRATE (PF) 100 MCG/2ML IJ SOLN
INTRAMUSCULAR | Status: AC
  Administered 2018-11-06: 25 ug via INTRAVENOUS
  Filled 2018-11-06: qty 2

## 2018-11-06 MED ORDER — ACETAMINOPHEN 500 MG PO TABS: 1000.0000 mg | ORAL_TABLET | Freq: Four times a day (QID) | ORAL | Status: DC | PRN

## 2018-11-06 MED ORDER — VANCOMYCIN HCL 10 G IV SOLR
1500.0000 mg | Freq: Two times a day (BID) | INTRAVENOUS | Status: DC
  Administered 2018-11-06 – 2018-11-08 (×4): 1500 mg via INTRAVENOUS
  Filled 2018-11-06 (×6): qty 1500

## 2018-11-06 MED ORDER — OXYCODONE HCL 5 MG PO TABS
5.0000 mg | ORAL_TABLET | ORAL | Status: DC | PRN
  Administered 2018-11-06 – 2018-11-09 (×9): 10 mg via ORAL
  Filled 2018-11-06 (×10): qty 2

## 2018-11-06 MED ORDER — PROPOFOL 10 MG/ML IV BOLUS
INTRAVENOUS | Status: DC | PRN
  Administered 2018-11-06: 200 mg via INTRAVENOUS

## 2018-11-06 MED ORDER — ONDANSETRON HCL 4 MG/2ML IJ SOLN: 4.0000 mg | Freq: Once | INTRAMUSCULAR | Status: DC | PRN

## 2018-11-06 MED ORDER — FENTANYL CITRATE (PF) 100 MCG/2ML IJ SOLN
25.0000 ug | INTRAMUSCULAR | Status: AC | PRN
  Administered 2018-11-06 (×6): 25 ug via INTRAVENOUS

## 2018-11-06 MED ORDER — SEVOFLURANE IN SOLN
RESPIRATORY_TRACT | Status: AC
  Filled 2018-11-06: qty 250

## 2018-11-06 MED ORDER — FENTANYL CITRATE (PF) 100 MCG/2ML IJ SOLN
INTRAMUSCULAR | Status: DC | PRN
  Administered 2018-11-05: 100 ug via INTRAVENOUS

## 2018-11-06 MED ORDER — ENOXAPARIN SODIUM 40 MG/0.4ML ~~LOC~~ SOLN
40.0000 mg | SUBCUTANEOUS | Status: DC
  Administered 2018-11-06 – 2018-11-09 (×4): 40 mg via SUBCUTANEOUS
  Filled 2018-11-06 (×4): qty 0.4

## 2018-11-06 MED ORDER — LACTATED RINGERS IV SOLN
INTRAVENOUS | Status: DC | PRN
  Administered 2018-11-05: via INTRAVENOUS

## 2018-11-06 MED ORDER — MIDAZOLAM HCL 2 MG/2ML IJ SOLN
INTRAMUSCULAR | Status: DC | PRN
  Administered 2018-11-05: 2 mg via INTRAVENOUS

## 2018-11-06 MED ORDER — LACTATED RINGERS IV SOLN
INTRAVENOUS | Status: DC
  Administered 2018-11-06 – 2018-11-08 (×5): via INTRAVENOUS

## 2018-11-06 MED ORDER — ONDANSETRON HCL 4 MG/2ML IJ SOLN
INTRAMUSCULAR | Status: DC | PRN
  Administered 2018-11-06: 4 mg via INTRAVENOUS

## 2018-11-06 MED ORDER — HYDROMORPHONE HCL 1 MG/ML IJ SOLN
0.5000 mg | INTRAMUSCULAR | Status: DC | PRN
  Administered 2018-11-06 – 2018-11-08 (×2): 0.5 mg via INTRAVENOUS
  Filled 2018-11-06 (×2): qty 0.5

## 2018-11-06 MED ORDER — FENTANYL CITRATE (PF) 100 MCG/2ML IJ SOLN
50.0000 ug | Freq: Once | INTRAMUSCULAR | Status: AC
  Administered 2018-11-06: 50 ug via INTRAVENOUS

## 2018-11-06 MED ORDER — KETOROLAC TROMETHAMINE 30 MG/ML IJ SOLN
30.0000 mg | Freq: Four times a day (QID) | INTRAMUSCULAR | Status: DC
  Administered 2018-11-06 – 2018-11-09 (×14): 30 mg via INTRAVENOUS
  Filled 2018-11-06 (×14): qty 1

## 2018-11-06 NOTE — Transfer of Care (Signed)
Immediate Anesthesia Transfer of Care Note  Patient: Laura [name]  Procedure(s) Performed: IRRIGATION AND DEBRIDEMENT ABSCESS - BREAST (Left )  Patient Location: PACU  Anesthesia Type:General  Level of Consciousness: awake, alert , oriented and patient cooperative  Airway & Oxygen Therapy: Patient Spontanous Breathing  Post-op Assessment: Report given to RN and Post -op Vital signs reviewed and stable  Post vital signs: Reviewed and stable  Last Vitals:  Vitals Value Taken Time  BP 111/45 11/06/2018 12:59 AM  Temp    Pulse 87 11/06/2018  1:01 AM  Resp 15 11/06/2018  1:01 AM  SpO2 97 % 11/06/2018  1:01 AM  Vitals shown include unvalidated device data.  Last Pain:  Vitals:   11/05/18 2110  TempSrc:   PainSc: 4          Complications: No apparent anesthesia complications

## 2018-11-06 NOTE — Anesthesia Postprocedure Evaluation (Signed)
Anesthesia Post Note  Patient: Laura [name]  Procedure(s) Performed: IRRIGATION AND DEBRIDEMENT ABSCESS - BREAST (Left )  Patient location during evaluation: PACU Anesthesia Type: General Level of consciousness: awake and alert Pain management: pain level controlled Vital Signs Assessment: post-procedure vital signs reviewed and stable Respiratory status: spontaneous breathing and respiratory function stable Cardiovascular status: stable Anesthetic complications: no     Last Vitals:  Vitals:   11/06/18 0120 11/06/18 0125  BP: 129/71   Pulse: 80 82  Resp: 13 12  Temp:    SpO2: 99% 94%    Last Pain:  Vitals:   11/06/18 0115  TempSrc:   PainSc: 5                  [name]

## 2018-11-06 NOTE — Consult Note (Signed)
Pharmacy Antibiotic Note  Laura [name] is a 47 y.o. female admitted on 11/05/2018 with cellulitis.  Pharmacy has been consulted for Zosyn and Vancomycin dosing. She was very recently admitted here and received vancomycin therapy, including a level.   Plan: 1) Based on previous dosing and levels dose should be changed to     1500mg  every 12 hours  Ke 0.076  Vd: 70L  T1/2 9h  Goal trough 10-15 mcg/mL  Will draw trough level prior to the 4th dose.   2) Zosyn 3.375g IV q8h (4 hour infusion).  Height: 5\' 6"  (167.6 cm) Weight: 220 lb 9.5 oz (100.1 kg) IBW/kg (Calculated) : 59.3  Temp (24hrs), Avg:98.8 F (37.1 C), Min:97.7 F (36.5 C), Max:99.2 F (37.3 C)  Recent Labs  Lab 10/31/18 1137 11/02/18 0504 11/02/18 1029 11/03/18 0451 11/05/18 1743 11/06/18 0401  WBC 10.6*  --   --  8.2 10.5 10.3  CREATININE 0.75 0.82  --  0.76 0.97 0.84  VANCOTROUGH  --   --  12*  --   --   --     Estimated Creatinine Clearance: 98.8 mL/min (by C-G formula based on SCr of 0.84 mg/dL).    Allergies  Allergen Reactions  . Bee Venom     Antimicrobials this admission: Zosyn 12/17 >>  Vanco 12/17 >>   Dose adjustments this admission: 12/18 Vancomycin 1250mg  q12h---->1000 mg q8h  Microbiology results:  12/18 WCx: pending  Thank you for allowing pharmacy to be a part of this patient's care.  Dallie Piles, PharmD Clinical Pharmacist 11/06/2018 1:34 PM

## 2018-11-06 NOTE — Anesthesia Procedure Notes (Signed)
Procedure Name: LMA Insertion Date/Time: 11/06/2018 12:01 AM Performed by: Lendon Colonel, CRNA Pre-anesthesia Checklist: Patient identified, Patient being monitored, Timeout performed, Emergency Drugs available and Suction available Patient Re-evaluated:Patient Re-evaluated prior to induction Oxygen Delivery Method: Circle system utilized Preoxygenation: Pre-oxygenation with 100% oxygen Induction Type: IV induction Ventilation: Mask ventilation without difficulty LMA: LMA inserted LMA Size: 4.0 Tube type: Oral Number of attempts: 1 Placement Confirmation: positive ETCO2 and breath sounds checked- equal and bilateral Tube secured with: Tape Dental Injury: Teeth and Oropharynx as per pre-operative assessment

## 2018-11-06 NOTE — Consult Note (Signed)
Infectious Disease     Reason for Consult Recurrent breast abscess   Referring Physician: Piscoya,  Date of Admission:  11/05/2018   Active Problems:   Necrotizing soft tissue infection   HPI: Laura [name] is a 47 y.o. female admitted December 17 with recurrent breast abscess.  The symptoms began in early December when she developed a boil on her left breast.  She was started as an outpatient on oral Keflex but they are worsened.  On December 12 she was seen in outpatient clinic and given an injection of Rocephin and Toradol.  Bactrim was added to the Keflex but she did not start it as she was sent to the emergency room..  The area was noted to be open and draining at the time.  She was admitted for IV antibiotics.  Seen by surgery and had an ultrasound which showed a 5 cm x 1 cm abscess.  She underwent I&D with findings of necrotic skin edges which were debrided.  Wound was packed and she was treated with IV vancomycin and ceftriaxone until December 14.  She was discharged on oral Bactrim.  Cultures were negative.  She was seen again by surgery and follow-up December 17.  She had started to have left breast pain and worsening redness and low-grade fevers sweats and chills.  Breast was very tender and erythematous and edematous.  There were some necrotic tissue at skin edge and boggy necrotic appearing undermined areas.  She had a CT scan which did not show any recurrent abscess but she did have necrotic skin and enlarged lymph nodes. Of note on admission she was afebrile white count 10.5.  Gram stain shows abundant white blood cells, rare gram-negative rods and rare gram-positive cocci.   Past Medical History:  Diagnosis Date  . Allergy   . Dysplasia of cervix   . Herpes genitalia   . Hypertension    HX of high readings   . Increased BMI   . Menstrual migraine    Past Surgical History:  Procedure Laterality Date  . DILATION AND CURETTAGE OF UTERUS  2014 FEB and August  . INCISION AND  DRAINAGE ABSCESS Left 10/31/2018   Procedure: INCISION AND DRAINAGE LEFT BREAST ABSCESS;  Surgeon: Olean Ree, MD;  Location: [facility] ORS;  Service: General;  Laterality: Left;  . IRRIGATION AND DEBRIDEMENT ABSCESS Left 11/05/2018   Procedure: IRRIGATION AND DEBRIDEMENT ABSCESS - BREAST;  Surgeon: Olean Ree, MD;  Location: [facility] ORS;  Service: General;  Laterality: Left;  . MOUTH SURGERY    . TONSILECTOMY/ADENOIDECTOMY WITH MYRINGOTOMY Bilateral 2011   Social History   Tobacco Use  . Smoking status: Never Smoker  . Smokeless tobacco: Never Used  Substance Use Topics  . Alcohol use: Yes    Comment: occasioally  . Drug use: No   Family History  Problem Relation Age of Onset  . Arthritis Mother   . Hypertension Mother   . Diabetes Mother   . Heart disease Father   . Hyperlipidemia Father   . Hypertension Father   . Cancer Neg Hx   . Breast cancer Neg Hx     Allergies:  Allergies  Allergen Reactions  . Bee Venom     Current antibiotics: Antibiotics Given (last 72 hours)    Date/Time Action Medication Dose Rate   11/05/18 1926 New Bag/Given   vancomycin (VANCOCIN) IVPB 1000 mg/200 mL premix 1,000 mg 200 mL/hr   11/05/18 2042 New Bag/Given   piperacillin-tazobactam (ZOSYN) IVPB 3.375 g 3.375 g  12.5 mL/hr   11/06/18 0512 New Bag/Given   piperacillin-tazobactam (ZOSYN) IVPB 3.375 g 3.375 g 12.5 mL/hr   11/06/18 0636 New Bag/Given   vancomycin (VANCOCIN) 1,250 mg in sodium chloride 0.9 % 250 mL IVPB 1,250 mg 166.7 mL/hr      MEDICATIONS: . enoxaparin (LOVENOX) injection  40 mg Subcutaneous Q24H  . ketorolac  30 mg Intravenous Q6H    Review of Systems - 11 systems reviewed and negative per HPI   OBJECTIVE: Temp:  [97.7 F (36.5 C)-99.2 F (37.3 C)] 98.6 F (37 C) (12/18 0806) Pulse Rate:  [74-97] 74 (12/18 0806) Resp:  [10-21] 20 (12/18 0806) BP: (106-147)/(45-92) 106/92 (12/18 0806) SpO2:  [93 %-99 %] 95 % (12/18 0806) Weight:  [100.1 kg] 100.1 kg (12/17  1905) Physical Exam  Constitutional: He is oriented to person, place, and time. He appears well-developed and well-nourished. No distress.  HENT: anicteric Mouth/Throat: Oropharynx is clear and moist. No oropharyngeal exudate.  Cardiovascular: Normal rate, regular rhythm and normal heart sounds.  Pulmonary/Chest: Effort normal and breath sounds normal. No respiratory distress. He has no wheezes.  Abdominal: Soft. Bowel sounds are normal. He exhibits no distension. There is no tenderness.  Breast - L breast covered post op but has large wound with some dark edges  Lymphadenopathy: He has no cervical adenopathy.  Neurological: He is alert and oriented to person, place, and time.  Skin: L breast as above Psychiatric: He has a normal mood and affect. His behavior is normal.   LABS: Results for orders placed or performed during the hospital encounter of 11/05/18 (from the past 48 hour(s))  Basic metabolic panel     Status: Abnormal   Collection Time: 11/05/18  5:43 PM  Result Value Ref Range   Sodium 135 135 - 145 mmol/L   Potassium 3.7 3.5 - 5.1 mmol/L   Chloride 102 98 - 111 mmol/L   CO2 26 22 - 32 mmol/L   Glucose, Bld 103 (H) 70 - 99 mg/dL   BUN 9 6 - 20 mg/dL   Creatinine, Ser 0.97 0.44 - 1.00 mg/dL   Calcium 8.5 (L) 8.9 - 10.3 mg/dL   GFR calc non Af Amer >60 >60 mL/min   GFR calc Af Amer >60 >60 mL/min   Anion gap 7 5 - 15    Comment: Performed at [facility], [address] [place], [place] [address]  CBC     Status: None   Collection Time: 11/05/18  5:43 PM  Result Value Ref Range   WBC 10.5 4.0 - 10.5 K/uL   RBC 4.61 3.87 - 5.11 MIL/uL   Hemoglobin 14.2 12.0 - 15.0 g/dL   HCT 42.6 36.0 - 46.0 %   MCV 92.4 80.0 - 100.0 fL   MCH 30.8 26.0 - 34.0 pg   MCHC 33.3 30.0 - 36.0 g/dL   RDW 12.2 11.5 - 15.5 %   Platelets 289 150 - 400 K/uL   nRBC 0.0 0.0 - 0.2 %    Comment: Performed at [facility], [address] [place], [place] [address]  Anaerobic  culture     Status: None (Preliminary result)   Collection Time: 11/06/18 12:17 AM  Result Value Ref Range   Specimen Description WOUND LEFT BREAST    Special Requests      PATIENT ON FOLLOWING ZOSYN VANC BACTRIM Performed at [facility], [place] [address] [place], [place] [address]    Gram Stain PENDING    Culture PENDING  Report Status PENDING   Aerobic Culture (superficial specimen)     Status: None (Preliminary result)   Collection Time: 11/06/18 12:17 AM  Result Value Ref Range   Specimen Description WOUND LEFT BREAST    Special Requests PATIENT ON FOLLOWING ZOSYN VANC BACTRIM    Gram Stain      RARE WBC PRESENT, PREDOMINANTLY PMN NO ORGANISMS SEEN Performed at [facility], [place] [address], [place], [place] [address]    Culture PENDING    Report Status PENDING   Aerobic/Anaerobic Culture (surgical/deep wound)     Status: None (Preliminary result)   Collection Time: 11/06/18 12:19 AM  Result Value Ref Range   Specimen Description TISSUE LEFT BREAST    Special Requests PATIENT ON FOLLOWING ZOSYN VANC BACTRIM    Gram Stain      ABUNDANT WBC PRESENT, PREDOMINANTLY PMN RARE GRAM NEGATIVE RODS RARE GRAM POSITIVE COCCI Performed at [facility], [place] [address], [place], [place] [address]    Culture PENDING    Report Status PENDING   CBC with Differential/Platelet     Status: Abnormal   Collection Time: 11/06/18  4:01 AM  Result Value Ref Range   WBC 10.3 4.0 - 10.5 K/uL   RBC 4.41 3.87 - 5.11 MIL/uL   Hemoglobin 13.6 12.0 - 15.0 g/dL   HCT 40.9 36.0 - 46.0 %   MCV 92.7 80.0 - 100.0 fL   MCH 30.8 26.0 - 34.0 pg   MCHC 33.3 30.0 - 36.0 g/dL   RDW 12.2 11.5 - 15.5 %   Platelets 315 150 - 400 K/uL   nRBC 0.0 0.0 - 0.2 %   Neutrophils Relative % 70 %   Neutro Abs 7.2 1.7 - 7.7 K/uL   Lymphocytes Relative 20 %   Lymphs Abs 2.1 0.7 - 4.0 K/uL   Monocytes Relative 8 %   Monocytes Absolute 0.8 0.1 - 1.0 K/uL   Eosinophils Relative 1 %   Eosinophils  Absolute 0.1 0.0 - 0.5 K/uL   Basophils Relative 0 %   Basophils Absolute 0.0 0.0 - 0.1 K/uL   Immature Granulocytes 1 %   Abs Immature Granulocytes 0.09 (H) 0.00 - 0.07 K/uL    Comment: Performed at [facility], [address] [place], [place] [address]  Basic metabolic panel     Status: Abnormal   Collection Time: 11/06/18  4:01 AM  Result Value Ref Range   Sodium 136 135 - 145 mmol/L   Potassium 3.7 3.5 - 5.1 mmol/L   Chloride 104 98 - 111 mmol/L   CO2 25 22 - 32 mmol/L   Glucose, Bld 117 (H) 70 - 99 mg/dL   BUN 10 6 - 20 mg/dL   Creatinine, Ser 0.84 0.44 - 1.00 mg/dL   Calcium 8.4 (L) 8.9 - 10.3 mg/dL   GFR calc non Af Amer >60 >60 mL/min   GFR calc Af Amer >60 >60 mL/min   Anion gap 7 5 - 15    Comment: Performed at [facility], [address] [place], [place] [address]   No components found for: ESR, C REACTIVE PROTEIN MICRO: Recent Results (from the past 720 hour(s))  Wound culture     Status: None   Collection Time: 10/31/18 11:15 AM  Result Value Ref Range Status   MICRO NUMBER: [id number]  Final   SPECIMEN QUALITY: Adequate  Final   SOURCE: NOT GIVEN  Final   STATUS: FINAL  Final   GRAM STAIN:  Final    No organisms or white blood cells seen No epithelial cells seen   RESULT: No Growth  Final  Aerobic/Anaerobic Culture (surgical/deep wound)     Status: None   Collection Time: 10/31/18  3:01 PM  Result Value Ref Range Status   Specimen Description   Final    WOUND Performed at [facility], [address], [place], [place] [address]    Special Requests   Final    WOUND Performed at [facility], [address] [place], [place] [address]    Gram Stain   Final    MODERATE WBC PRESENT, PREDOMINANTLY PMN NO ORGANISMS SEEN    Culture   Final    No growth aerobically or anaerobically. Performed at [facility], [place] [address] [place], [place] [address]    Report Status 11/06/2018 FINAL  Final  Surgical PCR  screen     Status: None   Collection Time: 10/31/18  5:59 PM  Result Value Ref Range Status   MRSA, PCR NEGATIVE NEGATIVE Final   Staphylococcus aureus NEGATIVE NEGATIVE Final    Comment: (NOTE) The Xpert SA Assay (FDA approved for NASAL specimens in patients 71 years of age and older), is one component of a comprehensive surveillance program. It is not intended to diagnose infection nor to guide or monitor treatment. Performed at [facility], [address] [place], [place] [address]   Aerobic/Anaerobic Culture (surgical/deep wound)     Status: None   Collection Time: 10/31/18  9:55 PM  Result Value Ref Range Status   Specimen Description   Final    WOUND Performed at [facility], [address] [place], [place] [address]    Special Requests   Final    NONE Performed at [facility], [place], [place] [address]    Gram Stain NO WBC SEEN NO ORGANISMS SEEN   Final   Culture   Final    No growth aerobically or anaerobically. Performed at [facility], [place] [address], [place], [place] [address]    Report Status 11/06/2018 FINAL  Final  Anaerobic culture     Status: None (Preliminary result)   Collection Time: 11/06/18 12:17 AM  Result Value Ref Range Status   Specimen Description WOUND LEFT BREAST  Final   Special Requests   Final    PATIENT ON FOLLOWING ZOSYN VANC BACTRIM Performed at [facility], [place] [address] [place], [place] [address]    Gram Stain PENDING  Incomplete   Culture PENDING  Incomplete   Report Status PENDING  Incomplete  Aerobic Culture (superficial specimen)     Status: None (Preliminary result)   Collection Time: 11/06/18 12:17 AM  Result Value Ref Range Status   Specimen Description WOUND LEFT BREAST  Final   Special Requests PATIENT ON FOLLOWING ZOSYN VANC BACTRIM  Final   Gram Stain   Final    RARE WBC PRESENT, PREDOMINANTLY PMN NO ORGANISMS SEEN Performed at [facility],  [place] [address], [place], [place] [address]    Culture PENDING  Incomplete   Report Status PENDING  Incomplete  Aerobic/Anaerobic Culture (surgical/deep wound)     Status: None (Preliminary result)   Collection Time: 11/06/18 12:19 AM  Result Value Ref Range Status   Specimen Description TISSUE LEFT BREAST  Final   Special Requests PATIENT ON FOLLOWING ZOSYN VANC BACTRIM  Final   Gram Stain   Final  ABUNDANT WBC PRESENT, PREDOMINANTLY PMN RARE GRAM NEGATIVE RODS RARE GRAM POSITIVE COCCI Performed at [facility], [place] [address] [place], [place] [address]    Culture PENDING  Incomplete   Report Status PENDING  Incomplete    IMAGING: Ct Chest W Contrast  Result Date: 11/05/2018 CLINICAL DATA:  Status post I & D of large left breast abscess 5 days prior. Patient presents with left chest wall pain, erythema and fever with sweats and chills. EXAM: CT CHEST WITH CONTRAST TECHNIQUE: Multidetector CT imaging of the chest was performed during intravenous contrast administration. CONTRAST:  12m OMNIPAQUE IOHEXOL 300 MG/ML  SOLN COMPARISON:  None. FINDINGS: Cardiovascular: Normal heart size. No significant pericardial effusion/thickening. Great vessels are normal in course and caliber. No central pulmonary emboli. Mediastinum/Nodes: No discrete thyroid nodules. Unremarkable esophagus. Mild left axillary adenopathy measuring up to 1.1 cm (series 2/image 48). No right axillary adenopathy. No pathologically enlarged mediastinal or hilar nodes. Lungs/Pleura: No pneumothorax. No pleural effusion. No acute consolidative airspace disease or lung masses. Peripheral right lower lobe 4 mm solid pulmonary nodule (series 3/image 70). No additional significant pulmonary nodules. Upper abdomen: No acute abnormality. Musculoskeletal: No aggressive appearing focal osseous lesions. Minimal thoracic spondylosis. Prominent asymmetric skin thickening throughout the left breast. No fluid collections in the chest wall.  IMPRESSION: 1. Nonspecific prominent asymmetric skin thickening throughout the left breast. No chest wall fluid collections. 2. Nonspecific mild left axillary lymphadenopathy. 3. Solitary 4 mm solid right lower lobe pulmonary nodule. No follow-up needed if patient is low-risk. Non-contrast chest CT can be considered in 12 months if patient is high-risk. This recommendation follows the consensus statement: Guidelines for Management of Incidental Pulmonary Nodules Detected on CT Images:From the Fleischner Society 2017; published online before print (10.1148/[id number]. Electronically Signed   By: JIlona SorrelM.D.   On: 11/05/2018 19:09   [employer] Uni Left Inc Axilla  Result Date: 10/31/2018 CLINICAL DATA:  LEFT breast open wound.  Evaluate size of abscess. EXAM: ULTRASOUND OF THE LEFT BREAST COMPARISON:  Previous exam(s). FINDINGS: On physical exam, there is a draining wound within the upper LEFT breast, 1 o'clock axis region, with surrounding erythema. Targeted ultrasound is performed, showing a complex collection which appears to be confined to the skin of the upper LEFT breast, measuring 5.5 cm in length by 1 cm depth. No obvious extension of the collection into the underlying breast tissues. IMPRESSION: Complex collection that appears to be confined to the skin of the upper LEFT breast, measuring 5.5 cm in length and 1 cm depth. RECOMMENDATION: 1. Clinical follow-up. 2. Annual screening mammograms. Per our records, patient will be due for annual bilateral screening mammogram at the end of this month. Findings discussed with the emergency room physician at the conclusion of the exam. I have discussed the findings and recommendations with the patient. Results were also provided in writing at the conclusion of the visit. If applicable, a reminder letter will be sent to the patient regarding the next appointment. BI-RADS CATEGORY  2: Benign. Electronically Signed   By: SFranki CabotM.D.   On:  10/31/2018 14:26    Assessment:   LAnokhi Shannonis a 47y.o. female with no past medical history who has had over the last 10 days rapidly progressive skin lesion on her breast and abscess formation.  She has no history of trauma or injury to the site.  Began with what she describes as a pimple or boil and rapidly progressive to an  ulcerated necrotic lesion.  She was initially started on oral Keflex but progressed over 1 to 2 days.  Was admitted December 12 and had debridement but with cultures negative.  She was treated with IV ceftriaxone and vancomycin for several days and then discharged on oral Bactrim.  The pain swelling and necrotic edges rapidly recurred. 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  However this is a diagnosis of exclusion.  The other issue is that it could be a rapidly progressing strep infection.  Bactrim does not cover strep skin infections so it could have worsened because she was discharged on this.  Cultures and pathology are pending. Recommendations At this point I would continue vancomycin and zosyn pending cultures. I would recommend to try to avoid further debridement and manage with nontraumatic wound dressings.  Discussed this with Dr. Hampton Abbot. I have consulted dermatology.  I called and left a message with  dermatology and also put a consult in. We will check an ANA, ANCA and rheumatoid factor.  She does not have any obvious history of a condition associated with pyoderma but in up to 50% of cases there is no related illness.  Her mother does have a history of rheumatoid arthritis.  Thank you very much for allowing me to participate in the care of this patient. Please call with questions.   Cheral Marker. Ola Spurr, MD

## 2018-11-06 NOTE — Anesthesia Post-op Follow-up Note (Signed)
Anesthesia QCDR form completed.        

## 2018-11-06 NOTE — Progress Notes (Signed)
[facility] Hospital Day(s): 1.   Post op day(s): 1 Day Post-Op.   Interval History: Patient seen and examined, doing well same day of surgery. Left breast pain has improved. No complaints of fever, chills, nausea, or emesis since the surgery.   Review of Systems:  Constitutional: denies fever, chills  Cardiovascular: denies chest pain or palpitations  Gastrointestinal: denies, N/V, or diarrhea/and bowel function as per interval history Integumentary: denies any other rashes or skin discolorations except known left breast wound  Vital signs in last 24 hours: [min-max] current  Temp:  [97.7 F (36.5 C)-99.2 F (37.3 C)] 98.6 F (37 C) (12/18 0806) Pulse Rate:  [74-97] 74 (12/18 0806) Resp:  [10-21] 20 (12/18 0806) BP: (106-147)/(45-92) 106/92 (12/18 0806) SpO2:  [93 %-99 %] 95 % (12/18 0806) Weight:  [100.1 kg] 100.1 kg (12/17 1905)     Height: 5\' 6"  (167.6 cm) Weight: 100.1 kg BMI (Calculated): 35.62   Intake/Output this shift:  Total I/O In: -  Out: 300 [Urine:300]   Intake/Output last 2 shifts:  @IOLAST2SHIFTS @   Physical Exam:  Constitutional: alert, cooperative and no distress  Respiratory: breathing non-labored at rest  Integumentary: Large dressing to left breast, surrounding erythema has improved. Will plan for formal dressing change tomorrow.   Labs:  CBC Latest Ref Rng & Units 11/06/2018 11/05/2018 11/03/2018  WBC 4.0 - 10.5 K/uL 10.3 10.5 8.2  Hemoglobin 12.0 - 15.0 g/dL 13.6 14.2 12.9  Hematocrit 36.0 - 46.0 % 40.9 42.6 39.5  Platelets 150 - 400 K/uL 315 289 267   CMP Latest Ref Rng & Units 11/06/2018 11/05/2018 11/03/2018  Glucose 70 - 99 mg/dL 117(H) 103(H) 96  BUN 6 - 20 mg/dL 10 9 11   Creatinine 0.44 - 1.00 mg/dL 0.84 0.97 0.76  Sodium 135 - 145 mmol/L 136 135 140  Potassium 3.5 - 5.1 mmol/L 3.7 3.7 3.5  Chloride 98 - 111 mmol/L 104 102 106  CO2 22 - 32 mmol/L 25 26 29   Calcium 8.9 - 10.3 mg/dL 8.4(L) 8.5(L)  8.0(L)  Total Protein 6.5 - 8.1 g/dL - - -  Total Bilirubin 0.3 - 1.2 mg/dL - - -  Alkaline Phos 38 - 126 U/L - - -  AST 15 - 41 U/L - - -  ALT 0 - 44 U/L - - -     Imaging studies: No new pertinent imaging studies   Assessment/Plan: 47 y.o. female with a previous left breast abscess which is 6 days s/p I&D who is now 1 Day Post-Op s/p debridement of left breast wound secondary to necrotic tissue, complicated by pertinent comorbidities including HTN and obesity.   - Clear liquids this morning, advance diet as tolerates  - Pain control as needed   - Will consult infectious disease for ABx recommendations  - Will plan for formal dressing change tomorrow and consult with plastic surgery for evaluation   - Medical management of comorbidities  - Mobilize  - DVT Prophylaxis   All of the above findings and recommendations were discussed with the patient, patient's family, and the medical team, and all of patient's and family's questions were answered to their expressed satisfaction.  -- Edison Simon, PA-C [facility] 11/06/2018, 9:21 AM [phone] M-F: 7am - 4pm

## 2018-11-06 NOTE — Op Note (Signed)
  Procedure Date:  11/06/2018  Pre-operative Diagnosis:  Left breast abscess and necrotizing skin infection  Post-operative Diagnosis:  Left breast abscess and necrotizing skin infection  Procedure:  Debridement and drainage of left breast abscess and necrotizing skin for area of 100 square cm  Surgeon:  Melvyn Neth, MD  Anesthesia:  General endotracheal  Estimated Blood Loss:  20 ml  Specimens:  Purulent fluid for culture, necrotized skin for culture, necrotized skin for biopsy  Complications:  None  Findings:  Overall wound size approximately 14 cm x 8 cm.  Indications for Procedure:  This is a 47 y.o. female with diagnosis of left breast abscess, s/p prior I&D on 12/12.  She presented to office today with worsening left breast pain and findings of necrotizing soft tissue infection.  She was admitted to hospital for surgery.  The risks of bleeding, abscess or infection, injury to surrounding structures, and need for further procedures were all discussed with the patient and was willing to proceed.  Description of Procedure: The patient was correctly identified in the preoperative area and brought into the operating room.  The patient was placed supine with VTE prophylaxis in place.  Appropriate time-outs were performed.  Anesthesia was induced and the patient was intubated.  Appropriate antibiotics were infused.  The patient's left breast and chest were prepped and draped in usual sterile fashion.  The prior I&D site had healthy tissue at the wound bed but the skin particularly inferior to the wound had necrotized.  There was skin necrosis extending inferiorly mostly, with some extending medially, superiorly, and laterally.  Culture swab was obtained from the inferior portion.  Most of the skin debridement was at the inferior portion, overall, amounting for 100 sq cm.  The skin was debrided sharply and with cautery.  Half of the tissue was sent for tissue culture, and the other half  to pathology for evaluation.  Once all the necrotized skin edges and area was debrided, the wound bed was healthy and oozing appropriately.  The whole area was pulse lavaged with 1 L of NS.  Bleeding was controlled with cautery.  The wound was then cleaned and dressed with wet to dry gauze and covered with ABD pads and secured with tape.  The patient was then emerged from anesthesia, extubated, and brought to the recovery room for further management.    The patient tolerated the procedure well and all counts were correct at the end of the case.   Melvyn Neth, MD

## 2018-11-07 LAB — HIV ANTIBODY (ROUTINE TESTING W REFLEX): HIV Screen 4th Generation wRfx: NONREACTIVE

## 2018-11-07 LAB — HEPATITIS C ANTIBODY: HCV Ab: <0.1 s/co ratio (ref 0.0–0.9)

## 2018-11-07 LAB — HEPATITIS B SURFACE ANTIGEN: Hepatitis B Surface Ag: NEGATIVE

## 2018-11-07 MED ORDER — LOSARTAN POTASSIUM 50 MG PO TABS
50.0000 mg | ORAL_TABLET | Freq: Every day | ORAL | Status: DC
  Administered 2018-11-07 – 2018-11-09 (×3): 50 mg via ORAL
  Filled 2018-11-07 (×3): qty 1

## 2018-11-07 MED ORDER — SODIUM CHLORIDE 0.9 % IV SOLN
INTRAVENOUS | Status: DC | PRN
  Administered 2018-11-07: 250 mL via INTRAVENOUS
  Administered 2018-11-07: 10 mL via INTRAVENOUS

## 2018-11-07 MED ORDER — BISACODYL 5 MG PO TBEC: 10.0000 mg | DELAYED_RELEASE_TABLET | Freq: Every day | ORAL | Status: DC | PRN

## 2018-11-07 MED ORDER — HYDROCHLOROTHIAZIDE 12.5 MG PO CAPS
12.5000 mg | ORAL_CAPSULE | Freq: Every day | ORAL | Status: DC
  Administered 2018-11-07 – 2018-11-09 (×3): 12.5 mg via ORAL
  Filled 2018-11-07 (×3): qty 1

## 2018-11-07 MED ORDER — DOCUSATE SODIUM 100 MG PO CAPS
100.0000 mg | ORAL_CAPSULE | Freq: Two times a day (BID) | ORAL | Status: DC
  Administered 2018-11-07 – 2018-11-09 (×3): 100 mg via ORAL
  Filled 2018-11-07 (×3): qty 1

## 2018-11-07 MED ORDER — BISMUTH SUBSALICYLATE 262 MG/15ML PO SUSP
30.0000 mL | ORAL | Status: DC | PRN
  Administered 2018-11-07: 30 mL via ORAL
  Filled 2018-11-07: qty 118

## 2018-11-07 MED ORDER — PANTOPRAZOLE SODIUM 40 MG PO TBEC
40.0000 mg | DELAYED_RELEASE_TABLET | Freq: Every day | ORAL | Status: DC
  Administered 2018-11-07 – 2018-11-09 (×3): 40 mg via ORAL
  Filled 2018-11-07 (×3): qty 1

## 2018-11-07 MED ORDER — LOSARTAN POTASSIUM-HCTZ 50-12.5 MG PO TABS: 1.0000 | ORAL_TABLET | Freq: Every day | ORAL | Status: DC

## 2018-11-07 MED ORDER — FLUCONAZOLE 100 MG PO TABS
150.0000 mg | ORAL_TABLET | ORAL | Status: DC
  Administered 2018-11-08: 150 mg via ORAL
  Filled 2018-11-07: qty 1.5

## 2018-11-07 NOTE — Progress Notes (Addendum)
11/07/18:  Patient is POD#2 s/p debridement of left breast necrotizing skin infection.  No acute events.  Patient seen by ID and Dermatology yesterday.  Appreciate their input.  Labs for RF, ANA, ANCA still pending.  Pathology pending.  Dressing change done today, showing healthy wound base without further purulent.  Skin edges appear healthy.  There is mild induration of the skin edges on the lateral portion of the left breast, on the dependent portion, which would be edema, but the skin edges there appear healthy as well.  There is tenderness in the lateral portion of the left breast.  Picture added below.  Dressing change done today and applied Aquacel Ag on the wound bed, covered with dry gauze and ABD pad.  Used breast binder to secure dressing in place.  Will continue antibiotics today, await pathology and pending labs.  Olean Ree, MD      [facility] Day(s): 2.   Post op day(s): 2 Days Post-Op.   Interval History: Patient seen and examined, no acute events or new complaints overnight. Patient reports that she has some discomfort on the lateral portion of her breast. She denied any fevers or chills. Tolerating a diet and mobilizing well. 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    Review of Systems:  Constitutional: denies fever, chills  Respiratory: denies any shortness of breath  Musculoskeletal: denies pain, decreased motor or sensation Integumentary: + left breast wound   Vital signs in last 24 hours: [min-max] current  Temp:  [97.6 F (36.4 C)-99.2 F (37.3 C)] 97.6 F (36.4 C) (12/19 0511) Pulse Rate:  [77-83] 79 (12/19 0511) Resp:  [16-20] 16 (12/19 0511) BP: (120-128)/(68-76) 120/68 (12/19 0511) SpO2:  [95 %-98 %] 95 % (12/19 0511)     Height: 5\' 6"  (167.6 cm) Weight: 100.1 kg BMI (Calculated): 35.62   Intake/Output this  shift:  No intake/output data recorded.   Intake/Output last 2 shifts:  @IOLAST2SHIFTS @   Physical Exam:  Constitutional: alert, cooperative and no distress  Respiratory: breathing non-labored at rest  Integumentary: Left breat wound is approximately 14 x 8 cm, wound tissue is pink and viable, all skin edges of the wound are viable. The lateral portion of the wound on the dependent edge of the breast is slightly indurated and swollen but remains viable. Dressing changed with Aquacel Ag, 4x4 gauze, and abdominal pad and place in breast binder.    Labs:  CBC Latest Ref Rng & Units 11/06/2018 11/05/2018 11/03/2018  WBC 4.0 - 10.5 K/uL 10.3 10.5 8.2  Hemoglobin 12.0 - 15.0 g/dL 13.6 14.2 12.9  Hematocrit 36.0 - 46.0 % 40.9 42.6 39.5  Platelets 150 - 400 K/uL 315 289 267   CMP Latest Ref Rng & Units 11/06/2018 11/05/2018 11/03/2018  Glucose 70 - 99 mg/dL 117(H) 103(H) 96  BUN 6 - 20 mg/dL 10 9 11   Creatinine 0.44 - 1.00 mg/dL 0.84 0.97 0.76  Sodium 135 - 145 mmol/L 136 135 140  Potassium 3.5 - 5.1 mmol/L 3.7 3.7 3.5  Chloride 98 - 111 mmol/L 104 102 106  CO2 22 - 32 mmol/L 25 26 29   Calcium 8.9 - 10.3 mg/dL 8.4(L) 8.5(L) 8.0(L)  Total Protein 6.5 - 8.1 g/dL - - -  Total Bilirubin 0.3 - 1.2 mg/dL - - -  Alkaline Phos 38 - 126 U/L - - -  AST 15 - 41 U/L - - -  ALT 0 - 44 U/L - - -     Assessment/Plan:  47 y.o. female with a previous left breast abscess which is 7 days s/p I&D who is now 2 Day Post-Op s/p debridement of left breast wound secondary to necrotic tissue, complicated by pertinent comorbidities including HTN and obesity.   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  -- Olean Ree, PA-C [facility] 11/07/2018, 10:31 AM [phone] M-F: 7am - 4pm

## 2018-11-07 NOTE — Progress Notes (Signed)
[facility] INFECTIOUS DISEASE PROGRESS NOTE Date of Admission:  11/05/2018     ID: Laura [name] is a 47 y.o. female with Breast cellulitis possible pyoderma Active Problems:   Necrotizing soft tissue infection   Subjective: No fevers, some nausea. Some breast pain  ROS  Eleven systems are reviewed and negative except per hpi  Medications:  Antibiotics Given (last 72 hours)    Date/Time Action Medication Dose Rate   11/05/18 1926 New Bag/Given   vancomycin (VANCOCIN) IVPB 1000 mg/200 mL premix 1,000 mg 200 mL/hr   11/05/18 2042 New Bag/Given   piperacillin-tazobactam (ZOSYN) IVPB 3.375 g 3.375 g 12.5 mL/hr   11/06/18 0512 New Bag/Given   piperacillin-tazobactam (ZOSYN) IVPB 3.375 g 3.375 g 12.5 mL/hr   11/06/18 0636 New Bag/Given   vancomycin (VANCOCIN) 1,250 mg in sodium chloride 0.9 % 250 mL IVPB 1,250 mg 166.7 mL/hr   11/06/18 1830 New Bag/Given   vancomycin (VANCOCIN) 1,500 mg in sodium chloride 0.9 % 500 mL IVPB 1,500 mg 250 mL/hr   11/06/18 1830 New Bag/Given   piperacillin-tazobactam (ZOSYN) IVPB 3.375 g 3.375 g 12.5 mL/hr   11/06/18 2202 New Bag/Given   piperacillin-tazobactam (ZOSYN) IVPB 3.375 g 3.375 g 12.5 mL/hr   11/07/18 0519 New Bag/Given   piperacillin-tazobactam (ZOSYN) IVPB 3.375 g 3.375 g 12.5 mL/hr   11/07/18 0521 New Bag/Given   vancomycin (VANCOCIN) 1,500 mg in sodium chloride 0.9 % 500 mL IVPB 1,500 mg 250 mL/hr     . docusate sodium  100 mg Oral BID  . enoxaparin (LOVENOX) injection  40 mg Subcutaneous Q24H  . [START ON 11/08/2018] fluconazole  150 mg Oral Q72H  . losartan  50 mg Oral Daily   And  . hydrochlorothiazide  12.5 mg Oral Daily  . ketorolac  30 mg Intravenous Q6H  . pantoprazole  40 mg Oral Daily    Objective: Vital signs in last 24 hours: Temp:  [97.6 F (36.4 C)-98.2 F (36.8 C)] 97.6 F (36.4 C) (12/19 0511) Pulse Rate:  [79-83] 79 (12/19 0511) Resp:  [16-20] 16 (12/19 0511) BP: (120-128)/(68-75) 120/68 (12/19  0511) SpO2:  [95 %-98 %] 95 % (12/19 0511) Constitutional: He is oriented to person, place, and time. He appears well-developed and well-nourished. No distress.  HENT: anicteric Mouth/Throat: Oropharynx is clear and moist. No oropharyngeal exudate.  Cardiovascular: Normal rate, regular rhythm and normal heart sounds.  Pulmonary/Chest: Effort normal and breath sounds normal. No respiratory distress. He has no wheezes.  Abdominal: Soft. Bowel sounds are normal. He exhibits no distension. There is no tenderness.  Breast - L breast imaging reviewed Lymphadenopathy: He has no cervical adenopathy.  Neurological: He is alert and oriented to person, place, and time.  Skin: L breast as above Psychiatric: He has a normal mood and affect. His behavior is normal.   Lab Results Recent Labs    11/05/18 1743 11/06/18 0401  WBC 10.5 10.3  HGB 14.2 13.6  HCT 42.6 40.9  NA 135 136  K 3.7 3.7  CL 102 104  CO2 26 25  BUN 9 10  CREATININE 0.97 0.84    Microbiology: Results for orders placed or performed during the hospital encounter of 11/05/18  Anaerobic culture     Status: None (Preliminary result)   Collection Time: 11/06/18 12:17 AM  Result Value Ref Range Status   Specimen Description WOUND LEFT BREAST  Final   Special Requests   Final    PATIENT ON FOLLOWING ZOSYN VANC BACTRIM Performed at [facility], [place] [address] [place], [place] [address]    Gram Stain PENDING  Incomplete   Culture PENDING  Incomplete   Report Status PENDING  Incomplete  Aerobic Culture (superficial specimen)     Status: None (Preliminary result)   Collection Time: 11/06/18 12:17 AM  Result Value Ref Range Status   Specimen Description WOUND LEFT BREAST  Final   Special Requests PATIENT ON FOLLOWING ZOSYN VANC BACTRIM  Final   Gram Stain   Final    RARE WBC PRESENT, PREDOMINANTLY PMN NO ORGANISMS SEEN    Culture   Final    NO GROWTH 1 DAY Performed at [facility], [place] [address],  [place], [place] [address]    Report Status PENDING  Incomplete  Aerobic/Anaerobic Culture (surgical/deep wound)     Status: None (Preliminary result)   Collection Time: 11/06/18 12:19 AM  Result Value Ref Range Status   Specimen Description TISSUE LEFT BREAST  Final   Special Requests PATIENT ON FOLLOWING ZOSYN VANC BACTRIM  Final   Gram Stain   Final    ABUNDANT WBC PRESENT, PREDOMINANTLY PMN RARE GRAM NEGATIVE RODS RARE GRAM POSITIVE COCCI    Culture   Final    NO GROWTH 1 DAY Performed at [facility], [place] [address], [place], [place] [address]    Report Status PENDING  Incomplete    Studies/Results: Ct Chest W Contrast  Result Date: 11/05/2018 CLINICAL DATA:  Status post I & D of large left breast abscess 5 days prior. Patient presents with left chest wall pain, erythema and fever with sweats and chills. EXAM: CT CHEST WITH CONTRAST TECHNIQUE: Multidetector CT imaging of the chest was performed during intravenous contrast administration. CONTRAST:  53mL OMNIPAQUE IOHEXOL 300 MG/ML  SOLN COMPARISON:  None. FINDINGS: Cardiovascular: Normal heart size. No significant pericardial effusion/thickening. Great vessels are normal in course and caliber. No central pulmonary emboli. Mediastinum/Nodes: No discrete thyroid nodules. Unremarkable esophagus. Mild left axillary adenopathy measuring up to 1.1 cm (series 2/image 48). No right axillary adenopathy. No pathologically enlarged mediastinal or hilar nodes. Lungs/Pleura: No pneumothorax. No pleural effusion. No acute consolidative airspace disease or lung masses. Peripheral right lower lobe 4 mm solid pulmonary nodule (series 3/image 70). No additional significant pulmonary nodules. Upper abdomen: No acute abnormality. Musculoskeletal: No aggressive appearing focal osseous lesions. Minimal thoracic spondylosis. Prominent asymmetric skin thickening throughout the left breast. No fluid collections in the chest wall. IMPRESSION: 1. Nonspecific  prominent asymmetric skin thickening throughout the left breast. No chest wall fluid collections. 2. Nonspecific mild left axillary lymphadenopathy. 3. Solitary 4 mm solid right lower lobe pulmonary nodule. No follow-up needed if patient is low-risk. Non-contrast chest CT can be considered in 12 months if patient is high-risk. This recommendation follows the consensus statement: Guidelines for Management of Incidental Pulmonary Nodules Detected on CT Images:From the Fleischner Society 2017; published online before print (10.1148/radiol.2440102725). Electronically Signed   By: Ilona Sorrel M.D.   On: 11/05/2018 19:09    Assessment/Plan: Laura [name] is a 47 y.o. female with no past medical history who has had over the last 10 days rapidly progressive skin lesion on her breast and abscess formation.  She has no history of trauma or injury to the site.  Began with what she describes as a pimple or boil and rapidly progressive to an ulcerated necrotic lesion.  She was initially started on oral Keflex but progressed over 1 to 2 days.  Was admitted December 12  and had debridement but with cultures negative.  She was treated with IV ceftriaxone and vancomycin for several days and then discharged on oral Bactrim.  The pain swelling and necrotic edges rapidly recurred. 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  However this is a diagnosis of exclusion.  The other issue is that it could be a rapidly progressing strep infection.  Bactrim does not cover strep skin infections so it could have worsened because she was discharged on this.  Cultures and pathology are pending.  12/19- no fevers, some nausea, site is clean. Some pain. HIV neg Hep serology negative. Other tests and cx path pending  Recommendations At this point I would  continue vancomycin and zosyn pending cultures. I would recommend to try to avoid further debridement and manage with nontraumatic wound dressings.  Discussed again today with Dr. Hampton Abbot. Seen by Dr Nehemiah Massed in dermatology. Path pending. Thank you very much for the consult. Will follow with you.  Leonel Ramsay   11/07/2018, 1:43 PM

## 2018-11-08 ENCOUNTER — Encounter: Payer: Self-pay | Admitting: Surgery

## 2018-11-08 LAB — ANCA TITERS
C-ANCA: <1:20 {titer}
P-ANCA: <1:20 {titer}

## 2018-11-08 LAB — ANA COMPREHENSIVE PANEL
Anti JO-1: <0.2 AI (ref 0.0–0.9)
Chromatin Ab SerPl-aCnc: <0.2 AI (ref 0.0–0.9)
ENA SM Ab Ser-aCnc: <0.2 AI (ref 0.0–0.9)
Ribonucleic Protein: <0.2 AI (ref 0.0–0.9)
SSA (Ro) (ENA) Antibody, IgG: <0.2 AI (ref 0.0–0.9)
SSB (La) (ENA) Antibody, IgG: <0.2 AI (ref 0.0–0.9)
Scleroderma (Scl-70) (ENA) Antibody, IgG: <0.2 AI (ref 0.0–0.9)

## 2018-11-08 LAB — BASIC METABOLIC PANEL
Anion gap: 7 (ref 5–15)
BUN: 12 mg/dL (ref 6–20)
CO2: 27 mmol/L (ref 22–32)
Calcium: 8.5 mg/dL — ABNORMAL LOW (ref 8.9–10.3)
Chloride: 106 mmol/L (ref 98–111)
Creatinine, Ser: 0.81 mg/dL (ref 0.44–1.00)
GFR calc Af Amer: >60 mL/min (ref >60)
GFR calc non Af Amer: >60 mL/min (ref >60)
Glucose, Bld: 87 mg/dL (ref 70–99)
Potassium: 4.2 mmol/L (ref 3.5–5.1)
Sodium: 140 mmol/L (ref 135–145)

## 2018-11-08 LAB — C DIFFICILE QUICK SCREEN W PCR REFLEX
C Diff antigen: NEGATIVE
C Diff interpretation: NOT DETECTED
C Diff toxin: NEGATIVE

## 2018-11-08 LAB — RHEUMATOID FACTOR: Rheumatoid fact SerPl-aCnc: <10 IU/mL (ref 0.0–13.9)

## 2018-11-08 LAB — VANCOMYCIN, TROUGH: Vancomycin Tr: 24 ug/mL (ref 15–20)

## 2018-11-08 MED ORDER — AMOXICILLIN-POT CLAVULANATE 875-125 MG PO TABS: 1.0000 | ORAL_TABLET | Freq: Two times a day (BID) | ORAL | 0 refills | Status: DC

## 2018-11-08 MED ORDER — FLUCONAZOLE 150 MG PO TABS: 150.0000 mg | ORAL_TABLET | ORAL | 0 refills | Status: DC

## 2018-11-08 MED ORDER — OXYCODONE HCL 5 MG PO TABS: 5.0000 mg | ORAL_TABLET | ORAL | 0 refills | Status: AC | PRN

## 2018-11-08 MED ORDER — AMOXICILLIN-POT CLAVULANATE 875-125 MG PO TABS
1.0000 | ORAL_TABLET | Freq: Two times a day (BID) | ORAL | Status: DC
  Administered 2018-11-08 – 2018-11-09 (×3): 1 via ORAL
  Filled 2018-11-08 (×3): qty 1

## 2018-11-08 MED ORDER — ACETAMINOPHEN 500 MG PO TABS: 1000.0000 mg | ORAL_TABLET | Freq: Four times a day (QID) | ORAL | 0 refills | Status: AC | PRN

## 2018-11-08 MED ORDER — IBUPROFEN 600 MG PO TABS: 600.0000 mg | ORAL_TABLET | Freq: Three times a day (TID) | ORAL | 0 refills | Status: DC | PRN

## 2018-11-08 NOTE — Consult Note (Signed)
Pharmacy Antibiotic Note  Laura [name] is a 47 y.o. female admitted on 11/05/2018 with cellulitis.  Pharmacy has been consulted for Zosyn and Vancomycin dosing. She was very recently admitted here and received vancomycin therapy, including a level.   Plan: 12/20 @ 0600 VT 24 mcg/mL drawn while dose was running. No recent BMP to assess renal function, fluids were stopped patient's last UOP was 12/17 - 12/18 w/ ~ 2.4L. Will recheck another trough tonight @ 1700 before tonight's dose.  Height: 5\' 6"  (167.6 cm) Weight: 220 lb 9.5 oz (100.1 kg) IBW/kg (Calculated) : 59.3  Temp (24hrs), Avg:98.3 F (36.8 C), Min:98 F (36.7 C), Max:98.7 F (37.1 C)  Recent Labs  Lab 11/02/18 0504 11/02/18 1029 11/03/18 0451 11/05/18 1743 11/06/18 0401 11/08/18 0552  WBC  --   --  8.2 10.5 10.3  --   CREATININE 0.82  --  0.76 0.97 0.84  --   VANCOTROUGH  --  12*  --   --   --  24*    Estimated Creatinine Clearance: 98.8 mL/min (by C-G formula based on SCr of 0.84 mg/dL).    Allergies  Allergen Reactions  . Bee Venom     Antimicrobials this admission: Zosyn 12/17 >>  Vanco 12/17 >>   Dose adjustments this admission: 12/18 Vancomycin 1250mg  q12h---->1000 mg q8h  Microbiology results:  12/18 WCx: pending  Thank you for allowing pharmacy to be a part of this patient's care.  Tobie Lords, PharmD Clinical Pharmacist 11/08/2018 6:43 AM

## 2018-11-08 NOTE — Progress Notes (Signed)
11/08/2018  Subjective: Patient is 3 Days Post-Op s/p left breast debridement and drainage due to necrotizing skin infection.  Had some loose bowel movements overnight and was placed on enteric precautions, but has not had any further BMs since.  Denies any abdominal pain.  Her left breast is less tender today.  A breast binder was applied yesterday and she feels that helps.  Vital signs: Temp:  [98 F (36.7 C)-98.7 F (37.1 C)] 98 F (36.7 C) (12/20 0529) Pulse Rate:  [73-92] 73 (12/20 0529) Resp:  [16-18] 16 (12/20 0529) BP: (123-139)/(81-83) 128/81 (12/20 0529) SpO2:  [96 %-98 %] 98 % (12/20 0529)   Intake/Output: 12/19 0701 - 12/20 0700 In: 3087.4 [P.O.:480; I.V.:1387.7; IV Piggyback:1219.7] Out: -  Last BM Date: 11/05/18  Physical Exam: Constitutional: No acute distress Skin:  Left breast wound with healthy skin edges and no necrosis.  Aquacel Ag dressing in place, did not remove it today.  The lateral portion of the wound which had more tenderness and mild induration yesterday is much improved today and without any tenderness. Picture taken through [facility] app and attached to progress note.  Labs:  Recent Labs    11/05/18 1743 11/06/18 0401  WBC 10.5 10.3  HGB 14.2 13.6  HCT 42.6 40.9  PLT 289 315   Recent Labs    11/06/18 0401 11/08/18 0551  NA 136 140  K 3.7 4.2  CL 104 106  CO2 25 27  GLUCOSE 117* 87  BUN 10 12  CREATININE 0.84 0.81  CALCIUM 8.4* 8.5*   No results for input(s): LABPROT, INR in the last 72 hours.  Imaging: No results found.  Assessment/Plan: This is a 47 y.o. female s/p left breast debridement and drainage.  --Continue IV antibiotics with vancomycin and zosyn.  Cultures still pending, showing rare growth of GPC and GNR.  Will discuss with ID today regarding weekend plans and if it would be feasible to discharge the patient home over weekend from their standpoint.   --Prelim pathology showing skin necrosis as expected.  Sample will be  sent to derm-pathology for further evaluation.  At this point wound edges are healthy and without further necrosis without any steroid therapy, so unclear if this would really be PG. --will do dressing change with Aquacel Ag tomorrow.   Melvyn Neth, [facility]

## 2018-11-08 NOTE — Progress Notes (Signed)
[facility] INFECTIOUS DISEASE PROGRESS NOTE Date of Admission:  11/05/2018     ID: Elfida Shimada is a 47 y.o. female with Breast cellulitis possible pyoderma Active Problems:   Necrotizing soft tissue infection   Subjective: No fevers, some nausea. Some breast pain  ROS  Eleven systems are reviewed and negative except per hpi  Medications:  Antibiotics Given (last 72 hours)    Date/Time Action Medication Dose Rate   11/05/18 1926 New Bag/Given   vancomycin (VANCOCIN) IVPB 1000 mg/200 mL premix 1,000 mg 200 mL/hr   11/05/18 2042 New Bag/Given   piperacillin-tazobactam (ZOSYN) IVPB 3.375 g 3.375 g 12.5 mL/hr   11/06/18 5277 New Bag/Given   piperacillin-tazobactam (ZOSYN) IVPB 3.375 g 3.375 g 12.5 mL/hr   11/06/18 0636 New Bag/Given   vancomycin (VANCOCIN) 1,250 mg in sodium chloride 0.9 % 250 mL IVPB 1,250 mg 166.7 mL/hr   11/06/18 1830 New Bag/Given   vancomycin (VANCOCIN) 1,500 mg in sodium chloride 0.9 % 500 mL IVPB 1,500 mg 250 mL/hr   11/06/18 1830 New Bag/Given   piperacillin-tazobactam (ZOSYN) IVPB 3.375 g 3.375 g 12.5 mL/hr   11/06/18 2202 New Bag/Given   piperacillin-tazobactam (ZOSYN) IVPB 3.375 g 3.375 g 12.5 mL/hr   11/07/18 0519 New Bag/Given   piperacillin-tazobactam (ZOSYN) IVPB 3.375 g 3.375 g 12.5 mL/hr   11/07/18 0521 New Bag/Given   vancomycin (VANCOCIN) 1,500 mg in sodium chloride 0.9 % 500 mL IVPB 1,500 mg 250 mL/hr   11/07/18 1409 New Bag/Given   piperacillin-tazobactam (ZOSYN) IVPB 3.375 g 3.375 g 12.5 mL/hr   11/07/18 1754 New Bag/Given   vancomycin (VANCOCIN) 1,500 mg in sodium chloride 0.9 % 500 mL IVPB 1,500 mg 250 mL/hr   11/07/18 2203 New Bag/Given   piperacillin-tazobactam (ZOSYN) IVPB 3.375 g 3.375 g 12.5 mL/hr   11/08/18 0530 New Bag/Given   vancomycin (VANCOCIN) 1,500 mg in sodium chloride 0.9 % 500 mL IVPB 1,500 mg 250 mL/hr   11/08/18 0532 New Bag/Given   piperacillin-tazobactam (ZOSYN) IVPB 3.375 g 3.375 g 12.5 mL/hr     .  docusate sodium  100 mg Oral BID  . enoxaparin (LOVENOX) injection  40 mg Subcutaneous Q24H  . fluconazole  150 mg Oral Q72H  . losartan  50 mg Oral Daily   And  . hydrochlorothiazide  12.5 mg Oral Daily  . ketorolac  30 mg Intravenous Q6H  . pantoprazole  40 mg Oral Daily    Objective: Vital signs in last 24 hours: Temp:  [98 F (36.7 C)-98.7 F (37.1 C)] 98 F (36.7 C) (12/20 0529) Pulse Rate:  [73-92] 73 (12/20 0529) Resp:  [16-18] 16 (12/20 0529) BP: (123-139)/(81-83) 128/81 (12/20 0529) SpO2:  [96 %-98 %] 98 % (12/20 0529) Constitutional: He is oriented to person, place, and time. He appears well-developed and well-nourished. No distress.  HENT: anicteric Mouth/Throat: Oropharynx is clear and moist. No oropharyngeal exudate.  Cardiovascular: Normal rate, regular rhythm and normal heart sounds.  Pulmonary/Chest: Effort normal and breath sounds normal. No respiratory distress. He has no wheezes.  Abdominal: Soft. Bowel sounds are normal. He exhibits no distension. There is no tenderness.  Breast - L breast imaging reviewed Lymphadenopathy: He has no cervical adenopathy.  Neurological: He is alert and oriented to person, place, and time.  Skin: L breast as above Psychiatric: He has a normal mood and affect. His behavior is normal.   Lab Results Recent Labs    11/05/18 1743 11/06/18 0401 11/08/18 0551  WBC 10.5 10.3  --  HGB 14.2 13.6  --   HCT 42.6 40.9  --   NA 135 136 140  K 3.7 3.7 4.2  CL 102 104 106  CO2 26 25 27   BUN 9 10 12   CREATININE 0.97 0.84 0.81    Microbiology: Results for orders placed or performed during the hospital encounter of 11/05/18  Anaerobic culture     Status: None (Preliminary result)   Collection Time: 11/06/18 12:17 AM  Result Value Ref Range Status   Specimen Description WOUND LEFT BREAST  Final   Special Requests   Final    PATIENT ON FOLLOWING ZOSYN VANC BACTRIM Performed at [facility], [place] [address], [place],  [place] [address]    Gram Stain PENDING  Incomplete   Culture PENDING  Incomplete   Report Status PENDING  Incomplete  Aerobic Culture (superficial specimen)     Status: None (Preliminary result)   Collection Time: 11/06/18 12:17 AM  Result Value Ref Range Status   Specimen Description WOUND LEFT BREAST  Final   Special Requests PATIENT ON FOLLOWING ZOSYN VANC BACTRIM  Final   Gram Stain   Final    RARE WBC PRESENT, PREDOMINANTLY PMN NO ORGANISMS SEEN    Culture   Final    NO GROWTH 2 DAYS Performed at [facility], [place] [address] [place], [place] [address]    Report Status PENDING  Incomplete  Aerobic/Anaerobic Culture (surgical/deep wound)     Status: None (Preliminary result)   Collection Time: 11/06/18 12:19 AM  Result Value Ref Range Status   Specimen Description TISSUE LEFT BREAST  Final   Special Requests PATIENT ON FOLLOWING ZOSYN VANC BACTRIM  Final   Gram Stain   Final    ABUNDANT WBC PRESENT, PREDOMINANTLY PMN RARE GRAM NEGATIVE RODS RARE GRAM POSITIVE COCCI    Culture   Final    NO GROWTH 2 DAYS Performed at [facility], [place] [address], [place], [place] [address]    Report Status PENDING  Incomplete  C difficile quick scan w PCR reflex     Status: None   Collection Time: 11/08/18 11:22 AM  Result Value Ref Range Status   C Diff antigen NEGATIVE NEGATIVE Final   C Diff toxin NEGATIVE NEGATIVE Final   C Diff interpretation No C. difficile detected.  Final    Comment: Performed at [facility], [place] [place], [place] [address]    Studies/Results: No results found.  Assessment/Plan: Azaylea Maves is a 47 y.o. female with no past medical history who has had over the last 10 days rapidly progressive skin lesion on her breast and abscess formation.  She has no history of trauma or injury to the site.  Began with what she describes as a pimple or boil and rapidly progressive to an ulcerated necrotic lesion.  She was initially started on  oral Keflex but progressed over 1 to 2 days.  Was admitted December 12 and had debridement but with cultures negative.  She was treated with IV ceftriaxone and vancomycin for several days and then discharged on oral Bactrim. The pain swelling and necrotic edges rapidly recurred. 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  However this is a diagnosis of exclusion.  The other issue is that it could be a rapidly progressing strep infection.  Bactrim does not cover strep skin infections so it could have worsened because she was discharged on this.  Cultures and pathology are pending.  12/19- no fevers, some nausea, site is clean. Some pain. HIV neg Hep serology negative. Other tests and cx path pending 12.20 - no fevers, breast site stable with no progression of necrosis. Cultures remain negative. Path pending  Recommendations At this point I would continue vancomycin and zosyn pending cultures. If stable enough for dc over weekend would send on oral augmentin 875 BID for at least 10 more days. I would recommend to try to avoid further debridement and manage with nontraumatic wound dressings.  Discussed again today with Dr. Hampton Abbot.  Seen by Dr Nehemiah Massed in dermatology. Path pending.  She should follow up with Derm after DC and I will message Dr Delaine Lame to arrange follow up as well.  Thank you very much for the consult. Will follow with you.  Leonel Ramsay   11/08/2018, 1:45 PM

## 2018-11-08 NOTE — Discharge Summary (Addendum)
Patient ID: Laura [name] MRN: [id number] DOB/AGE: [birth year] 47 y.o.  Admit date: 11/05/2018 Discharge date: 11/09/2018   Discharge Diagnoses:  Active Problems:   Necrotizing soft tissue infection   Procedures:  Debridement and drainage of left breast abscess and necrotizing skin for area of 100 sq cm  Hospital Course:  Patient was admitted on 12/17 directly from clinic with a worsening necrotizing skin infection and abscess of the left breast.  She had previously undergone I&D of left breast abscess on 12/12.  She was taken to the OR on 12/17 for debridement of necrotic tissue and drainage of abscess.  She did well post-operatively and was on broad spectrum antibiotics vancomycin and zosyn.  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  She started postop with wet to dry dressing, and this was changed on POD#2 to Aquacel Ag to minimize trauma to the wound which is important in cases of pyoderma. On POD#3, the skin edges appeared healthy and without necrosis.  Her antibiotics were changed to oral Augmentin.  Her cultures currently are pending, but showing rare GPC and GNR.  Her pathology is showing areas of necrosis and skin fragments with neutrophilic dermal infiltrates.  This is seen in cases of pyoderma, but is not specific to it.  Further stains have been sent for fungal and acid-fast.  There was also discussion initially with the patient for a plastic surgery consult, but due to concern for possible pyoderma, will await until final pathology and stains are completed in order to do outpatient referral to plastic surgery for skin grafting.     The skin edges are healthy and there is no further necrosis or purulence.  Patient will be using Aquacel Ag for dressing changes.  She will be discharged on Augmentin for 14 days and will be given additional Diflucan dose for yeast infection.  Consults: Infectious Disease,  Dermatology  Disposition:  Home, self-care  Discharge Instructions    Call MD for:  difficulty breathing, headache or visual disturbances   Complete by:  As directed    Call MD for:  persistant nausea and vomiting   Complete by:  As directed    Call MD for:  redness, tenderness, or signs of infection (pain, swelling, redness, odor or green/yellow discharge around incision site)   Complete by:  As directed    Call MD for:  severe uncontrolled pain   Complete by:  As directed    Call MD for:  temperature >100.4   Complete by:  As directed    Change dressing (specify)   Complete by:  As directed    Dressing change:  Cut sheet of Aquacel Ag to shape of wound.  Cover with dry gauze and ABD pad.  Apply breast binder to secure dressing in place.  Change Aquacel Ag dressing every 3 days.  Change outer gauze and ABD pad daily and as needed to keep the area clean and dry.   Diet - low sodium heart healthy   Complete by:  As directed    Discharge instructions   Complete by:  As directed    1.  Patient may shower, but do not scrub wound heavily and dab dry only. 2.  Do not submerge wounds in pool/tub. 3.  Complete antibiotic as indicated 4.  May take additional dose of Fluconazole on 12/23 if still symptomatic with yeast infection.   Driving Restrictions   Complete by:  As directed  Do not drive while taking narcotics for pain control.   Increase activity slowly   Complete by:  As directed      Allergies as of 11/08/2018      Reactions   Bee Venom       Medication List    STOP taking these medications   sulfamethoxazole-trimethoprim 800-160 MG tablet Commonly known as:  BACTRIM DS,SEPTRA DS     TAKE these medications   acetaminophen 500 MG tablet Commonly known as:  TYLENOL Take 2 tablets (1,000 mg total) by mouth every 6 (six) hours as needed for mild pain or fever.   ALPRAZolam 0.5 MG tablet Commonly known as:  XANAX Take 1 tablet (0.5 mg total) by mouth at bedtime as  needed for anxiety.   amoxicillin-clavulanate 875-125 MG tablet Commonly known as:  AUGMENTIN Take 1 tablet by mouth every 12 (twelve) hours.   bisacodyl 5 MG EC tablet Commonly known as:  DULCOLAX Take 2 tablets (10 mg total) by mouth daily as needed for mild constipation.   cyanocobalamin 1000 MCG/ML injection Commonly known as:  (VITAMIN B-12) Inject 1 mL (1,000 mcg total) into the muscle once a week.   desvenlafaxine 50 MG 24 hr tablet Commonly known as:  PRISTIQ TAKE 1 TABLET BY MOUTH EVERY DAY What changed:  when to take this   docusate sodium 100 MG capsule Commonly known as:  COLACE Take 1 capsule (100 mg total) by mouth 2 (two) times daily.   esomeprazole 20 MG capsule Commonly known as:  NEXIUM Take 20 mg by mouth daily at 12 noon.   fluconazole 150 MG tablet Commonly known as:  DIFLUCAN Take 1 tablet (150 mg total) by mouth every 3 (three) days. Start taking on:  November 11, 2018 What changed:  when to take this   ibuprofen 600 MG tablet Commonly known as:  ADVIL,MOTRIN Take 1 tablet (600 mg total) by mouth every 8 (eight) hours as needed for fever or moderate pain.   levonorgestrel 20 MCG/24HR IUD Commonly known as:  MIRENA 1 each by Intrauterine route once.   losartan-hydrochlorothiazide 50-12.5 MG tablet Commonly known as:  HYZAAR TAKE 1 TABLET BY MOUTH EVERY DAY   oxyCODONE 5 MG immediate release tablet Commonly known as:  Oxy IR/ROXICODONE Take 1 tablet (5 mg total) by mouth every 4 (four) hours as needed for up to 7 days for severe pain. What changed:  reasons to take this   phentermine 37.5 MG tablet Commonly known as:  ADIPEX-P Take 1 tablet (37.5 mg total) by mouth daily before breakfast.   SYRINGE 3CC/25GX1" 25G X 1" 3 ML Misc Use for b12 injections            Discharge Care Instructions  (From admission, onward)         Start     Ordered   11/08/18 0000  Change dressing (specify)    Comments:  Dressing change:  Cut sheet of  Aquacel Ag to shape of wound.  Cover with dry gauze and ABD pad.  Apply breast binder to secure dressing in place.  Change Aquacel Ag dressing every 3 days.  Change outer gauze and ABD pad daily and as needed to keep the area clean and dry.   11/08/18 2112         Follow-up Information    Suheyb Raucci, Jacqulyn Bath, MD Follow up in 1 week(s).   Specialty:  General Surgery Why:  Follow up on 11/15/18. Contact information: [address] [place]  [place]        Ralene Bathe, MD Follow up in 1 week(s).   Specialty:  Dermatology Contact information: [address]   [place] [address] [phone]        Tsosie Billing, MD Follow up in 2 week(s).   Specialty:  Infectious Diseases Contact information: [address] [place] [address] [phone]

## 2018-11-09 LAB — AEROBIC CULTURE W GRAM STAIN (SUPERFICIAL SPECIMEN): Culture: NO GROWTH

## 2018-11-09 LAB — AEROBIC CULTURE  (SUPERFICIAL SPECIMEN)

## 2018-11-09 NOTE — Progress Notes (Signed)
Discharge instructions reviewed with patient including followup visits and new medications.  Understanding was verbalized and all questions were answered.  IV removed without complication; patient tolerated well.  Patient discharged home ambulatory in stable condition.

## 2018-11-11 ENCOUNTER — Telehealth: Payer: Self-pay | Admitting: Internal Medicine

## 2018-11-11 LAB — ANAEROBIC CULTURE

## 2018-11-11 LAB — AEROBIC/ANAEROBIC CULTURE W GRAM STAIN (SURGICAL/DEEP WOUND): Culture: NO GROWTH

## 2018-11-11 LAB — AEROBIC/ANAEROBIC CULTURE (SURGICAL/DEEP WOUND)

## 2018-11-11 NOTE — Telephone Encounter (Signed)
Copied from [place] [id number]. Topic: Quick Communication - See Telephone Encounter >> Nov 11, 2018  4:19 PM Bea Graff, NT wrote: CRM for notification. See Telephone encounter for: 11/11/18. Collie Siad with [facility] calling to let pts PCP know that the pt was recently d/c'd from [facility] for necrotizing tissue of the breast. She states pt may need to follow up with her PCP. CB#: [phone]

## 2018-11-11 NOTE — Telephone Encounter (Signed)
Transition Care Management Follow-up Telephone Call  How have you been since you were released from the hospital? Just very sore especially during dressing changes.   Do you understand why you were in the hospital? yes   Do you understand the discharge instrcutions? yes  Items Reviewed:  Medications reviewed: yes  Allergies reviewed: yes  Dietary changes reviewed: yes  Referrals reviewed: yes   Functional Questionnaire:   Activities of Daily Living (ADLs):   She states they are independent in the following: ambulation, bathing and hygiene, feeding, continence, grooming, toileting and dressing States they require assistance with the following: No assistance at this time.   Any transportation issues/concerns?: no   Any patient concerns? no   Confirmed importance and date/time of follow-up visits scheduled: yes   Confirmed with patient if condition begins to worsen call PCP or go to the ER.  Patient was given the Call-a-Nurse line [phone]: yes

## 2018-11-15 ENCOUNTER — Ambulatory Visit (INDEPENDENT_AMBULATORY_CARE_PROVIDER_SITE_OTHER): Payer: BC Managed Care – PPO | Admitting: Surgery

## 2018-11-15 ENCOUNTER — Encounter: Payer: Self-pay | Admitting: Surgery

## 2018-11-15 ENCOUNTER — Other Ambulatory Visit: Payer: Self-pay

## 2018-11-15 VITALS — BP 140/88 | HR 75 | Temp 98.1°F | Ht 66.0 in | Wt 217.8 lb

## 2018-11-15 DIAGNOSIS — M7989 Other specified soft tissue disorders: Secondary | ICD-10-CM

## 2018-11-15 DIAGNOSIS — Z09 Encounter for follow-up examination after completed treatment for conditions other than malignant neoplasm: Secondary | ICD-10-CM

## 2018-11-15 DIAGNOSIS — N611 Abscess of the breast and nipple: Secondary | ICD-10-CM

## 2018-11-15 MED ORDER — "AQUACEL-AG EXTRA HYDROFIBER 6""X6"" EX PADS": 1.0000 "application " | MEDICATED_PAD | CUTANEOUS | 0 refills | Status: DC

## 2018-11-15 NOTE — Progress Notes (Signed)
11/15/2018  HPI: Laura [name] is a 47 y.o. female s/p debridement of necrotic tissue from necrotizing skin infection of the left breast on 12/17.  During hospital stay, ID and dermatology were consulted.  There was concern for pyoderma gangrenosum as the etiology.  Her pathology did show skin with heavy neutrophilic dermal infiltrates.  Stains for bacteria, fungus, and acid fast were negative.  Cultures were negative.  Her skin edges after debridement have been healing well without any steroid and has been doing dressing changes with Aquacel Ag.  She denies any worsening pain or purulent drainage.  Vital signs: BP 140/88   Pulse 75   Temp 98.1 F (36.7 C) (Temporal)   Ht 5\' 6"  (1.676 m)   Wt 217 lb 12.8 oz (98.8 kg)   SpO2 99%   BMI 35.15 kg/m    Physical Exam: Constitutional: No acute distress Breast:  Left breast wound with healthy skin edges and no evidence of necrosis.  There is healthy granulation tissue at the wound bed.  No purulent drainage.  Wound dressed with aquacel ag, dry gauze, and ABD pad.  Picture attached.  Assessment/Plan: This is a 47 y.o. female s/p left breast debridement of necrotizing skin infection.  --Discussed with the patient that I'm not convinced that this is pyoderma gangrenosum.  Though her cultures have been negative and the pathology does show neutrophilic infitrates, her wound has been healing well without any need for steroids and there is no necrosis or worsening skin edges.  She has been healing well with only antibiotic and dressing changes.  However, she does have an appointment with dermatology on Monday and have asked her to keep [place] informed.   --If dermatology does not think she has pyoderma, then I discussed with the patient that we can set up a referral with plastic surgery for evaluation for possible skin graft to heal the wound.  If there is pyoderma, then however, the donor site also has risk of having ulcerations. --Patient will also follow  up with ID team in a week.   --Patient will follow up with me on January 7th to check her progress.   Melvyn Neth, [facility]

## 2018-11-15 NOTE — Patient Instructions (Addendum)
Patient is to return to the office in 2 weeks in the [facility] office, follow up with dermatology.   Call the office with any questions or concerns.

## 2018-11-18 ENCOUNTER — Ambulatory Visit (INDEPENDENT_AMBULATORY_CARE_PROVIDER_SITE_OTHER): Payer: BC Managed Care – PPO | Admitting: Internal Medicine

## 2018-11-18 ENCOUNTER — Encounter: Payer: Self-pay | Admitting: Surgery

## 2018-11-18 ENCOUNTER — Encounter: Payer: Self-pay | Admitting: Internal Medicine

## 2018-11-18 VITALS — BP 120/85 | HR 76 | Temp 98.5°F | Resp 15 | Ht 66.0 in | Wt 221.0 lb

## 2018-11-18 DIAGNOSIS — M7989 Other specified soft tissue disorders: Secondary | ICD-10-CM

## 2018-11-18 DIAGNOSIS — T819XXD Unspecified complication of procedure, subsequent encounter: Secondary | ICD-10-CM

## 2018-11-18 DIAGNOSIS — Z09 Encounter for follow-up examination after completed treatment for conditions other than malignant neoplasm: Secondary | ICD-10-CM

## 2018-11-18 MED ORDER — HYDROXYZINE HCL 25 MG PO TABS: 25.0000 mg | ORAL_TABLET | Freq: Three times a day (TID) | ORAL | 0 refills | Status: DC | PRN

## 2018-11-18 NOTE — Patient Instructions (Signed)
Daily use of Probiotics for a minimum of  3 weeks is advised to reduce risk of C dificile colitis.   This can be yogurt,  Kombucha, Kevita, or an OTC capsule (Align,  Culturelle,  [place])

## 2018-11-18 NOTE — Progress Notes (Signed)
Subjective:  Patient ID: Laura [name], female    DOB: 1971-10-24  Age: 47 y.o. MRN: [id number]  CC: The primary encounter diagnosis was Abnormal surgical wound, subsequent encounter. Diagnoses of Necrotizing soft tissue infection and Hospital discharge follow-up were also pertinent to this visit.  HPI Laura [name] presents for hospital follow up  Admitted to [facility]  on Dec 12 after a developing necrotizing skin infection involving the lateral side of her left breast.  \ History:  She developed a pustule and was treated empirically by [facility] provider on Dec 9 via an E Visit with cephalexin.  She was first examined by  Philis Nettle on Dec 12,  Due to the area becoming rapidly indurated, painful,  and erythematous despite taking the antibiotics for over 48 hours.  The infection appeared to be an abscess with surrounding cellulitis. NP Guse probed the abscess for culture(  Which grew nothing) and sent patient to ER for I & D after giving her  Rocephin IM injection .  She was admitted with the diagnosis of necrotizing skin infection and spontaneously draining abscess   On Dec 12.  She was discharged home on Dec 15 on  Septra , developed a rash on her torso (presumed due to Septra) ,  and  Was readmitted on Dec 17 for repeat debridement of necrotic tissue  , done again   by general surgeon Piscoya  On  Dec 18,.  .    Cultures were  repeatedly negative for bacterial growth.  Path report suggested pyoderma gangrenosum .  ID  Ola Spurr was consulted as well as Dermatology Nehemiah Massed).   She was eventually discharged on Dec 21 with  Ongoing 14 day antibiotic therapy with augmentin and dressing changes using Aquacel Ag (done expertly by husband WD) . The wound is improving based on reports from patient and husband,  His templates have been decreasing in size and the thickness of the wound has also decreased .  Marland Kitchen  Outpatient Medications Prior to Visit  Medication Sig Dispense Refill  . acetaminophen (TYLENOL)  500 MG tablet Take 2 tablets (1,000 mg total) by mouth every 6 (six) hours as needed for mild pain or fever. 30 tablet 0  . ALPRAZolam (XANAX) 0.5 MG tablet Take 1 tablet (0.5 mg total) by mouth at bedtime as needed for anxiety. 30 tablet 3  . amoxicillin-clavulanate (AUGMENTIN) 875-125 MG tablet Take 1 tablet by mouth every 12 (twelve) hours. 28 tablet 0  . bisacodyl (DULCOLAX) 5 MG EC tablet Take 2 tablets (10 mg total) by mouth daily as needed for mild constipation. 30 tablet 0  . cyanocobalamin (,VITAMIN B-12,) 1000 MCG/ML injection Inject 1 mL (1,000 mcg total) into the muscle once a week. 10 mL 6  . desvenlafaxine (PRISTIQ) 50 MG 24 hr tablet TAKE 1 TABLET BY MOUTH EVERY DAY (Patient taking differently: Take 50 mg by mouth every evening. ) 90 tablet 1  . docusate sodium (COLACE) 100 MG capsule Take 1 capsule (100 mg total) by mouth 2 (two) times daily. 20 capsule 0  . esomeprazole (NEXIUM) 20 MG capsule Take 20 mg by mouth daily at 12 noon.    Marland Kitchen ibuprofen (ADVIL,MOTRIN) 600 MG tablet Take 1 tablet (600 mg total) by mouth every 8 (eight) hours as needed for fever or moderate pain. 30 tablet 0  . levonorgestrel (MIRENA) 20 MCG/24HR IUD 1 each by Intrauterine route once.    Marland Kitchen losartan-hydrochlorothiazide (HYZAAR) 50-12.5 MG tablet TAKE 1 TABLET BY MOUTH EVERY DAY (  Patient taking differently: Take 1 tablet by mouth daily. ) 90 tablet 1  . phentermine (ADIPEX-P) 37.5 MG tablet Take 1 tablet (37.5 mg total) by mouth daily before breakfast. 30 tablet 2  . Silver-Carboxymethylcellulose (AQUACEL-AG EXTRA HYDROFIBER) 6"X6" PADS Apply 1 application topically every other day. 20 each 0  . Syringe/Needle, Disp, (SYRINGE 3CC/25GX1") 25G X 1" 3 ML MISC Use for b12 injections 50 each 0  . fluconazole (DIFLUCAN) 150 MG tablet Take 1 tablet (150 mg total) by mouth every 3 (three) days. (Patient not taking: Reported on 11/18/2018) 1 tablet 0   No facility-administered medications prior to visit.     Review of  Systems;  Patient denies headache, fevers, malaise, unintentional weight loss, skin rash, eye pain, sinus congestion and sinus pain, sore throat, dysphagia,  hemoptysis , cough, dyspnea, wheezing, chest pain, palpitations, orthopnea, edema, abdominal pain, nausea, melena, diarrhea, constipation, flank pain, dysuria, hematuria, urinary  Frequency, nocturia, numbness, tingling, seizures,  Focal weakness, Loss of consciousness,  Tremor, insomnia, depression, anxiety, and suicidal ideation.      Objective:  BP 120/85 (BP Location: Left Arm, Patient Position: Sitting, Cuff Size: Large)   Pulse 76   Temp 98.5 F (36.9 C) (Oral)   Resp 15   Ht 5\' 6"  (1.676 m)   Wt 221 lb (100.2 kg)   SpO2 98%   BMI 35.67 kg/m   BP Readings from Last 3 Encounters:  11/18/18 120/85  11/15/18 140/88  11/09/18 (!) 141/79    Wt Readings from Last 3 Encounters:  11/18/18 221 lb (100.2 kg)  11/15/18 217 lb 12.8 oz (98.8 kg)  11/05/18 220 lb 9.5 oz (100.1 kg)    General appearance: alert, cooperative and appears stated age Neck: no adenopathy, no carotid bruit, supple, symmetrical, trachea midline and thyroid not enlarged, symmetric, no tenderness/mass/nodules Back: symmetric, no curvature. ROM normal. No CVA tenderness.no rash Lungs: clear to auscultation bilaterally Heart: regular rate and rhythm, S1, S2 normal, no murmur, click, rub or gallop Breast: left breast with debrided area measuring showing health granulating tissue in the wound bed . Wound has a violaceous border.  Scattered areas of blackened eschar attributed to cauterization during surgical debridement  Pulses: 2+ and symmetric Skin: Skin color, texture, turgor normal. No rashes or lesions Lymph nodes: Cervical, supraclavicular, and axillary nodes normal.  Lab Results  Component Value Date   HGBA1C 5.5 09/18/2018   HGBA1C 5.3 04/01/2018   HGBA1C 5.6 11/22/2016    Lab Results  Component Value Date   CREATININE 0.81 11/08/2018    CREATININE 0.84 11/06/2018   CREATININE 0.97 11/05/2018    Lab Results  Component Value Date   WBC 10.3 11/06/2018   HGB 13.6 11/06/2018   HCT 40.9 11/06/2018   PLT 315 11/06/2018   GLUCOSE 87 11/08/2018   CHOL 138 09/18/2018   TRIG 115.0 09/18/2018   HDL 56.10 09/18/2018   LDLDIRECT 59.0 11/22/2016   LDLCALC 59 09/18/2018   ALT 22 10/31/2018   AST 21 10/31/2018   NA 140 11/08/2018   K 4.2 11/08/2018   CL 106 11/08/2018   CREATININE 0.81 11/08/2018   BUN 12 11/08/2018   CO2 27 11/08/2018   TSH 2.26 04/01/2018   HGBA1C 5.5 09/18/2018   MICROALBUR 0.9 11/22/2016    Ct Chest W Contrast  Result Date: 11/05/2018 CLINICAL DATA:  Status post I & D of large left breast abscess 5 days prior. Patient presents with left chest wall pain, erythema and fever with sweats and chills.  EXAM: CT CHEST WITH CONTRAST TECHNIQUE: Multidetector CT imaging of the chest was performed during intravenous contrast administration. CONTRAST:  70mL OMNIPAQUE IOHEXOL 300 MG/ML  SOLN COMPARISON:  None. FINDINGS: Cardiovascular: Normal heart size. No significant pericardial effusion/thickening. Great vessels are normal in course and caliber. No central pulmonary emboli. Mediastinum/Nodes: No discrete thyroid nodules. Unremarkable esophagus. Mild left axillary adenopathy measuring up to 1.1 cm (series 2/image 48). No right axillary adenopathy. No pathologically enlarged mediastinal or hilar nodes. Lungs/Pleura: No pneumothorax. No pleural effusion. No acute consolidative airspace disease or lung masses. Peripheral right lower lobe 4 mm solid pulmonary nodule (series 3/image 70). No additional significant pulmonary nodules. Upper abdomen: No acute abnormality. Musculoskeletal: No aggressive appearing focal osseous lesions. Minimal thoracic spondylosis. Prominent asymmetric skin thickening throughout the left breast. No fluid collections in the chest wall. IMPRESSION: 1. Nonspecific prominent asymmetric skin thickening  throughout the left breast. No chest wall fluid collections. 2. Nonspecific mild left axillary lymphadenopathy. 3. Solitary 4 mm solid right lower lobe pulmonary nodule. No follow-up needed if patient is low-risk. Non-contrast chest CT can be considered in 12 months if patient is high-risk. This recommendation follows the consensus statement: Guidelines for Management of Incidental Pulmonary Nodules Detected on CT Images:From the Fleischner Society 2017; published online before print (10.1148/radiol.[id number]). Electronically Signed   By: Ilona Sorrel M.D.   On: 11/05/2018 19:09    Assessment & Plan:   Problem List Items Addressed This Visit    Hospital discharge follow-up    Patient is stable post discharge and has no new issues or questions about discharge plans at the visit today for hospital follow up. The potential diagnosis of pyoderma gangrenosum was discussed with patient  No changes to antibiotics were made.  All labs , imaging studies and progress notes from admission were reviewed with patient today        Necrotizing soft tissue infection    Etiology unclear given multiple negative cultures, which were however, collected after empiric therapy with cephalexin was started on Dec 9 via a [facility] E visit. Wound is improving based on exam today and reports from patient and husband.  Continue Augmentin pending repeat assessments by dermatology, Gen surg.        Other Visit Diagnoses    Abnormal surgical wound, subsequent encounter    -  Primary   Relevant Orders   DME Other see comment      I am having Laura A. Treanor "Andra" start on hydrOXYzine. I am also having her maintain her levonorgestrel, esomeprazole, ALPRAZolam, cyanocobalamin, SYRINGE 3CC/25GX1", desvenlafaxine, phentermine, losartan-hydrochlorothiazide, bisacodyl, docusate sodium, acetaminophen, amoxicillin-clavulanate, fluconazole, ibuprofen, and AQUACEL-AG EXTRA HYDROFIBER.  Meds ordered this encounter  Medications  .  hydrOXYzine (ATARAX/VISTARIL) 25 MG tablet    Sig: Take 1 tablet (25 mg total) by mouth 3 (three) times daily as needed for itching.    Dispense:  90 tablet    Refill:  0    There are no discontinued medications.  Follow-up: No follow-ups on file.   Crecencio Mc, MD

## 2018-11-19 DIAGNOSIS — Z09 Encounter for follow-up examination after completed treatment for conditions other than malignant neoplasm: Secondary | ICD-10-CM | POA: Insufficient documentation

## 2018-11-19 NOTE — Assessment & Plan Note (Addendum)
Patient is stable post discharge and has no new issues or questions about discharge plans at the visit today for hospital follow up. The potential diagnosis of pyoderma gangrenosum was discussed with patient  No changes to antibiotics were made.  All labs , imaging studies and progress notes from admission were reviewed with patient today

## 2018-11-19 NOTE — Assessment & Plan Note (Signed)
Etiology unclear given multiple negative cultures, which were however, collected after empiric therapy with cephalexin was started on Dec 9 via a [facility] E visit. Wound is improving based on exam today and reports from patient and husband.  Continue Augmentin pending repeat assessments by dermatology, Gen surg.

## 2018-11-21 ENCOUNTER — Encounter: Payer: Self-pay | Admitting: Infectious Diseases

## 2018-11-21 ENCOUNTER — Ambulatory Visit: Payer: BC Managed Care – PPO | Attending: Infectious Diseases | Admitting: Infectious Diseases

## 2018-11-21 VITALS — BP 126/71 | HR 79 | Wt 223.0 lb

## 2018-11-21 DIAGNOSIS — Z792 Long term (current) use of antibiotics: Secondary | ICD-10-CM

## 2018-11-21 DIAGNOSIS — I10 Essential (primary) hypertension: Secondary | ICD-10-CM

## 2018-11-21 DIAGNOSIS — N61 Mastitis without abscess: Secondary | ICD-10-CM

## 2018-11-21 LAB — SURGICAL PATHOLOGY

## 2018-11-21 NOTE — Progress Notes (Signed)
NAME: Laura [name]  DOB: 03-Aug-1971  MRN: [id number]  Date/Time: 11/21/2018 9:10 AM Subjective:   ?Patient is here with her husband Laura [name] is a 48 y.o. female here for follow up after hopsital discharge for a breast infection. 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  On 10/29/2018 she noted severe redness and pain on the site and she had an ED visit with [facility] and was prescribed Keflex   In spite of 2 days of antibiotics lesion on the breast got worse and on 10/31/2018 she came to the ED and was seen by surgeon and had excision of the infected area    The surgical culture did not have any WBC or bacteria and She was sent home on Bactrim after getting IV antibiotics in the hospital. She was readmitted on 12/17 as the wound was worse      and underwent further surgery.      During that hospitalization was seen by Dr.Fitzgerald ID and he questioned Pyoderma gangrenosum.   Was seen by Dermatology Dr.Kowalski  , and pathology of the tissue showed FRAGMENTS OF SKIN WITH HEAVY NEUTROPHILIC DERMAL INFILTRATES. - FRAGMENTS OF SUPPURATIVE NECROTIC DEBRIS CONSISTENT WITH ABSCESS. Special stains for bacteria, AFB and fungus were neg Cultures were negative. Pt was sent home on PO augmentin X 14  Days and she will complete the  course on 11/24/17 She is doing very well- the wound is healing. She has  seen derm and surgery She never had any fever, no injury to the breast, new hot tub or whirlpool, no piercing, or steroid use.  She has a family history of rheumatoid arthritis (mom).  Immune work-up for her was negative.  Past Medical History:  Diagnosis Date  . Allergy   . Dysplasia of cervix   . Herpes genitalia   . Hypertension    HX of high readings   . Increased BMI   . Menstrual migraine     Past Surgical History:  Procedure Laterality Date  . DILATION AND CURETTAGE OF UTERUS  2014 FEB and August    . INCISION AND DRAINAGE ABSCESS Left 10/31/2018   Procedure: INCISION AND DRAINAGE LEFT BREAST ABSCESS;  Surgeon: Olean Ree, MD;  Location: [facility] ORS;  Service: General;  Laterality: Left;  . IRRIGATION AND DEBRIDEMENT ABSCESS Left 11/05/2018   Procedure: IRRIGATION AND DEBRIDEMENT ABSCESS - BREAST;  Surgeon: Olean Ree, MD;  Location: [facility] ORS;  Service: General;  Laterality: Left;  . MOUTH SURGERY    . TONSILECTOMY/ADENOIDECTOMY WITH MYRINGOTOMY Bilateral 2011    Social History   Socioeconomic History  . Marital status: Married    Spouse name: Not on file  . Number of children: Not on file  . Years of education: Not on file  . Highest education level: Not on file  Occupational History  . Occupation: Solicitor: [employer]  . Financial resource strain: Not on file  . Food insecurity:    Worry: Not on file    Inability: Not on file  . Transportation needs:    Medical: Not on file    Non-medical: Not on file  Tobacco Use  . Smoking status: Never Smoker  . Smokeless tobacco: Never Used  Substance and Sexual Activity  . Alcohol use: Yes    Comment: occasioally  . Drug use: No  . Sexual activity: Yes    Birth control/protection:  I.U.D.  Lifestyle  . Physical activity:    Days per week: 2 days    Minutes per session: 30 min  . Stress: Not at all  Relationships  . Social connections:    Talks on phone: Not on file    Gets together: Not on file    Attends religious service: Not on file    Active member of club or organization: Not on file    Attends meetings of clubs or organizations: Not on file    Relationship status: Not on file  . Intimate partner violence:    Fear of current or ex partner: No    Emotionally abused: No    Physically abused: No    Forced sexual activity: No  Other Topics Concern  . Not on file  Social History Narrative  . Not on file    Family History  Problem Relation Age of Onset  . Arthritis Mother   .  Hypertension Mother   . Diabetes Mother   . Heart disease Father   . Hyperlipidemia Father   . Hypertension Father   . Cancer Neg Hx   . Breast cancer Neg Hx    Allergies  Allergen Reactions  . Bee Venom   ? Current Outpatient Medications  Medication Sig Dispense Refill  . acetaminophen (TYLENOL) 500 MG tablet Take 2 tablets (1,000 mg total) by mouth every 6 (six) hours as needed for mild pain or fever. 30 tablet 0  . ALPRAZolam (XANAX) 0.5 MG tablet Take 1 tablet (0.5 mg total) by mouth at bedtime as needed for anxiety. 30 tablet 3  . amoxicillin-clavulanate (AUGMENTIN) 875-125 MG tablet Take 1 tablet by mouth every 12 (twelve) hours. 28 tablet 0  . cyanocobalamin (,VITAMIN B-12,) 1000 MCG/ML injection Inject 1 mL (1,000 mcg total) into the muscle once a week. 10 mL 6  . desvenlafaxine (PRISTIQ) 50 MG 24 hr tablet TAKE 1 TABLET BY MOUTH EVERY DAY (Patient taking differently: Take 50 mg by mouth every evening. ) 90 tablet 1  . esomeprazole (NEXIUM) 20 MG capsule Take 20 mg by mouth daily at 12 noon.    . fluconazole (DIFLUCAN) 150 MG tablet Take 1 tablet (150 mg total) by mouth every 3 (three) days. 1 tablet 0  . ibuprofen (ADVIL,MOTRIN) 600 MG tablet Take 1 tablet (600 mg total) by mouth every 8 (eight) hours as needed for fever or moderate pain. 30 tablet 0  . levonorgestrel (MIRENA) 20 MCG/24HR IUD 1 each by Intrauterine route once.    Marland Kitchen losartan-hydrochlorothiazide (HYZAAR) 50-12.5 MG tablet TAKE 1 TABLET BY MOUTH EVERY DAY (Patient taking differently: Take 1 tablet by mouth daily. ) 90 tablet 1  . Silver-Carboxymethylcellulose (AQUACEL-AG EXTRA HYDROFIBER) 6"X6" PADS Apply 1 application topically every other day. 20 each 0  . Syringe/Needle, Disp, (SYRINGE 3CC/25GX1") 25G X 1" 3 ML MISC Use for b12 injections 50 each 0  . bisacodyl (DULCOLAX) 5 MG EC tablet Take 2 tablets (10 mg total) by mouth daily as needed for mild constipation. (Patient not taking: Reported on 11/21/2018) 30 tablet  0  . docusate sodium (COLACE) 100 MG capsule Take 1 capsule (100 mg total) by mouth 2 (two) times daily. (Patient not taking: Reported on 11/21/2018) 20 capsule 0  . hydrOXYzine (ATARAX/VISTARIL) 25 MG tablet Take 1 tablet (25 mg total) by mouth 3 (three) times daily as needed for itching. (Patient not taking: Reported on 11/21/2018) 90 tablet 0  . phentermine (ADIPEX-P) 37.5 MG tablet Take 1 tablet (37.5 mg  total) by mouth daily before breakfast. (Patient not taking: Reported on 11/21/2018) 30 tablet 2   No current facility-administered medications for this visit.      Abtx:  Anti-infectives (From admission, onward)   None      REVIEW OF SYSTEMS:  Const: negative fever, negative chills, negative weight loss Eyes: negative diplopia or visual changes, negative eye pain ENT: negative coryza, negative sore throat Resp: negative cough, hemoptysis, dyspnea Cards: negative for chest pain, palpitations, lower extremity edema GU: negative for frequency, dysuria and hematuria GI: Negative for abdominal pain, diarrhea, bleeding, constipation Skin: breast wound Heme: negative for easy bruising and gum/nose bleeding MS: negative for myalgias, arthralgias, back pain and muscle weakness Neurolo:negative for headaches, dizziness, vertigo, memory problems  Psych: h/o  anxiety, depression  Endocrine: negative for thyroid, diabetes issues Allergy/Immunology- negative for any medication or food allergies ?  Objective:  VITALS:  BP 126/71 (BP Location: Left Arm, Patient Position: Sitting, Cuff Size: Large)   Pulse 79   Wt 223 lb (101.2 kg)   BMI 35.99 kg/m  PHYSICAL EXAM:  General: Alert, cooperative, no distress, appears stated age.  Head: Normocephalic, without obvious abnormality, atraumatic. Eyes: Conjunctivae clear, anicteric sclerae. Pupils are equal ENT Nares normal. No drainage or sinus tenderness. Lips, mucosa, and tongue normal. No Thrush Neck: Supple, symmetrical, no adenopathy, thyroid:  non tender no carotid bruit and no JVD. Back: Did not examine Lungs: Not examined Heart: Not examined  abdomen: Soft, non-tender,not distended. Bowel sounds normal. No masses Breast examination left breast there is a 7 cm full-thickness surgical  wound which is very clean with red granulating tissue.  The edge of the wound is not violaceous or erythematous and is not overhanging.   Left axilla examined no lymph nodes present No edema of the arms Lymph: Cervical, supraclavicular normal. Neurologic: Grossly non-focal Pertinent Labs Lab Results CBC    Component Value Date/Time   WBC 10.3 11/06/2018 0401   RBC 4.41 11/06/2018 0401   HGB 13.6 11/06/2018 0401   HGB 14.1 06/16/2012 0632   HCT 40.9 11/06/2018 0401   HCT 40.6 06/16/2012 0632   PLT 315 11/06/2018 0401   PLT 218 06/16/2012 0632   MCV 92.7 11/06/2018 0401   MCV 90 06/16/2012 0632   MCH 30.8 11/06/2018 0401   MCHC 33.3 11/06/2018 0401   RDW 12.2 11/06/2018 0401   RDW 12.7 06/16/2012 0632   LYMPHSABS 2.1 11/06/2018 0401   MONOABS 0.8 11/06/2018 0401   EOSABS 0.1 11/06/2018 0401   BASOSABS 0.0 11/06/2018 0401    CMP Latest Ref Rng & Units 11/08/2018 11/06/2018 11/05/2018  Glucose 70 - 99 mg/dL 87 117(H) 103(H)  BUN 6 - 20 mg/dL 12 10 9   Creatinine 0.44 - 1.00 mg/dL 0.81 0.84 0.97  Sodium 135 - 145 mmol/L 140 136 135  Potassium 3.5 - 5.1 mmol/L 4.2 3.7 3.7  Chloride 98 - 111 mmol/L 106 104 102  CO2 22 - 32 mmol/L 27 25 26   Calcium 8.9 - 10.3 mg/dL 8.5(L) 8.4(L) 8.5(L)  Total Protein 6.5 - 8.1 g/dL - - -  Total Bilirubin 0.3 - 1.2 mg/dL - - -  Alkaline Phos 38 - 126 U/L - - -  AST 15 - 41 U/L - - -  ALT 0 - 44 U/L - - -      Microbiology: No results found for this or any previous visit (from the past 240 hour(s)). IMAGING RESULTS: ? Impression/Recommendation ? ?48 year old female with history of hypertension with recent  left breast infection is here for follow-up Left breast infection followed by surgery  due to with full-thickness surgical wound.  The question was whether this was group A streptococcus versus pyoderma gangrenosum.  The quick progression of the initial lesion favored the former.  But it did not respond to Keflex.  She did not have any fever or white count and culture was negative and then surgery was done in the wound got worse which questioned  pyoderma gangrenosum.  As the latter is a diagnosis of exclusion it was decided by ID who had seen her before to treat her as infection with Augmentin for 14 days and also follow-up with dermatology and surgery. It was also mentioned to surgery not to do any further surgical intervention on this breast. Currently the breast l wound looks very healthy and there is no evidence of infection.  So after she completes Augmentin on Saturday she does not need any further antibiotic.  she will get local wound care and also follow-up with surgery may be plastics and dermatology. Her mom has rheumatoid arthritis but her work-up has been negative.  If the wound gets worse she may need a course of steroids and also checking her immunoglobulins.  We will see the patient in the future if  needed.  ________________________________________________ Discussed with patient and her husband in great detail  note:  This document was prepared using Dragon voice recognition software and may include unintentional dictation errors.

## 2018-11-21 NOTE — Patient Instructions (Signed)
You are here for follow up of an infection on your left breast which started on 12/7 and you had 2 surgeries and now on the way to recovery. Being treated as infection with augmentin and topical treatment with aquacel- Pyoderma gangrenosum was questioned but as it is a diagnosis of exclusion we now are focusing on infection . Once you finish augmentin you will not need any more antibiotics- follow up with surgeon and dermatology . Will see if needed

## 2018-11-26 ENCOUNTER — Ambulatory Visit (INDEPENDENT_AMBULATORY_CARE_PROVIDER_SITE_OTHER): Payer: BC Managed Care – PPO | Admitting: Surgery

## 2018-11-26 ENCOUNTER — Other Ambulatory Visit: Payer: Self-pay

## 2018-11-26 ENCOUNTER — Encounter: Payer: Self-pay | Admitting: Surgery

## 2018-11-26 VITALS — BP 127/90 | HR 80 | Temp 98.2°F | Resp 16 | Ht 66.0 in | Wt 224.0 lb

## 2018-11-26 DIAGNOSIS — Z09 Encounter for follow-up examination after completed treatment for conditions other than malignant neoplasm: Secondary | ICD-10-CM

## 2018-11-26 DIAGNOSIS — N611 Abscess of the breast and nipple: Secondary | ICD-10-CM

## 2018-11-26 DIAGNOSIS — M7989 Other specified soft tissue disorders: Secondary | ICD-10-CM

## 2018-11-26 NOTE — Progress Notes (Signed)
11/26/2018  HPI: Natika Geyer is a 48 y.o. female s/p left breast I&D and then further debridement from necrotizing skin infection.  There was initial concern for pyoderma gangrenosum, but after further consults and clinic evaluations with ID and dermatology, we believe this is a skin infection.  Patient continues doing aquacel ag dressing changes and has noted continued improvement in the wound.  Denies any worsening pain, drainage, chills, fevers, erythema.  Vital signs: BP 127/90   Pulse 80   Temp 98.2 F (36.8 C) (Oral)   Resp 16   Ht 5\' 6"  (1.676 m)   Wt 224 lb (101.6 kg)   SpO2 100%   BMI 36.15 kg/m    Physical Exam: Constitutional: No acute distress Breast:  Left breast wound is healing well, with healthy granulation tissue.  No purulence, erythema, or induration.  Wound measures roughly 11 cm by 7 cm.    Assessment/Plan: This is a 48 y.o. female s/p left breast I&D and debridement  --continue dressing changes with Aquacel Ag.  Patient is healing well and does not need any antibiotics at this point. --will send referral to plastic surgery for consultation regarding any needs for skin grafting or other wound care.   --follow up in 2 weeks.   Melvyn Neth, [facility]

## 2018-11-26 NOTE — Patient Instructions (Signed)
Please continue with the dressing changes.  We will send the referral to Plastic surgeon and someone from her office will contact you within 5-7 days. If you do not hear from her office please call our office so we can check on this for you.   We will see you back in 2 weeks for wound check.

## 2018-11-29 ENCOUNTER — Ambulatory Visit: Payer: BC Managed Care – PPO | Admitting: Plastic Surgery

## 2018-11-29 ENCOUNTER — Encounter: Payer: Self-pay | Admitting: Plastic Surgery

## 2018-11-29 VITALS — BP 146/85 | HR 69 | Temp 98.3°F | Ht 66.0 in | Wt 224.0 lb

## 2018-11-29 DIAGNOSIS — N611 Abscess of the breast and nipple: Secondary | ICD-10-CM | POA: Diagnosis not present

## 2018-12-02 ENCOUNTER — Ambulatory Visit: Payer: BC Managed Care – PPO | Admitting: Plastic Surgery

## 2018-12-02 ENCOUNTER — Encounter: Payer: Self-pay | Admitting: Plastic Surgery

## 2018-12-02 VITALS — BP 146/88 | HR 80 | Temp 98.6°F | Ht 66.0 in | Wt 224.0 lb

## 2018-12-02 DIAGNOSIS — N611 Abscess of the breast and nipple: Secondary | ICD-10-CM | POA: Diagnosis not present

## 2018-12-02 NOTE — Progress Notes (Signed)
   Subjective:    Patient ID: Laura [name], female    DOB: May 05, 1971, 48 y.o.   MRN: [id number]  The patient is a 48 year old white female here for follow-up on her left breast.  It is approximately 5 x 9 cm.  There has not been much change from Friday.  Appear to be infected and seems to be growing granulating well.  She has been using collagen dressings which seem to have helped.   Review of Systems  Constitutional: Negative.   HENT: Negative.   Eyes: Negative.   Respiratory: Negative.   Gastrointestinal: Negative.   Endocrine: Negative.   Genitourinary: Negative.   Musculoskeletal: Negative.   Skin: Positive for color change and wound.       Objective:   Physical Exam Vitals signs and nursing note reviewed.  Neck:     Musculoskeletal: Normal range of motion.  Cardiovascular:     Rate and Rhythm: Normal rate.  Chest:    Skin:    General: Skin is warm.  Neurological:     Mental Status: She is alert.  Psychiatric:        Mood and Affect: Mood normal.        Thought Content: Thought content normal.        Judgment: Judgment normal.        Assessment & Plan:  Abscess of breast, left  Donated ACell applied powder and sheet.  KY be applied daily.  Do not get it wet.  Follow-up in 1 week

## 2018-12-02 NOTE — Progress Notes (Signed)
Patient ID: Laura [name], female    DOB: June 01, 1971, 48 y.o.   MRN: [id number]   Chief Complaint  Patient presents with  . Advice Only    wound care on (L) breast    The patient is a 48 year old white female here for evaluation of her left breast wound.  The patient's history is that she had a left breast abscess that developed around December 12 with redness, tenderness and swelling.  She was given Bactrim and instructed on wet-to-dry dressings.  Unfortunately the pain increased and she started having fevers and chills.  She was taken to the OR for surgical irrigation and debridement.  Since then she has been doing well and granulating the wound to almost flush with the remaining adjacent tissue.  She does not have any fevers and does not have any rash that she exhibited early on.  She has a history of fatty liver and hypertension.  She works for the [employer] in [place].    Review of Systems  Constitutional: Negative.   HENT: Negative.   Eyes: Negative.   Respiratory: Negative.   Gastrointestinal: Negative.   Endocrine: Negative.   Genitourinary: Negative.   Musculoskeletal: Negative.   Skin: Positive for color change and wound.  Neurological: Negative.     Past Medical History:  Diagnosis Date  . Allergy   . Dysplasia of cervix   . Herpes genitalia   . Hypertension    HX of high readings   . Increased BMI   . Menstrual migraine     Past Surgical History:  Procedure Laterality Date  . DILATION AND CURETTAGE OF UTERUS  2014 FEB and August  . INCISION AND DRAINAGE ABSCESS Left 10/31/2018   Procedure: INCISION AND DRAINAGE LEFT BREAST ABSCESS;  Surgeon: Olean Ree, MD;  Location: [facility] ORS;  Service: General;  Laterality: Left;  . IRRIGATION AND DEBRIDEMENT ABSCESS Left 11/05/2018   Procedure: IRRIGATION AND DEBRIDEMENT ABSCESS - BREAST;  Surgeon: Olean Ree, MD;  Location: [facility] ORS;  Service: General;  Laterality: Left;  . MOUTH SURGERY    .  TONSILECTOMY/ADENOIDECTOMY WITH MYRINGOTOMY Bilateral 2011      Current Outpatient Medications:  .  acetaminophen (TYLENOL) 500 MG tablet, Take 2 tablets (1,000 mg total) by mouth every 6 (six) hours as needed for mild pain or fever., Disp: 30 tablet, Rfl: 0 .  ALPRAZolam (XANAX) 0.5 MG tablet, Take 1 tablet (0.5 mg total) by mouth at bedtime as needed for anxiety., Disp: 30 tablet, Rfl: 3 .  bisacodyl (DULCOLAX) 5 MG EC tablet, Take 2 tablets (10 mg total) by mouth daily as needed for mild constipation., Disp: 30 tablet, Rfl: 0 .  cyanocobalamin (,VITAMIN B-12,) 1000 MCG/ML injection, Inject 1 mL (1,000 mcg total) into the muscle once a week., Disp: 10 mL, Rfl: 6 .  desvenlafaxine (PRISTIQ) 50 MG 24 hr tablet, TAKE 1 TABLET BY MOUTH EVERY DAY (Patient taking differently: Take 50 mg by mouth every evening. ), Disp: 90 tablet, Rfl: 1 .  esomeprazole (NEXIUM) 20 MG capsule, Take 20 mg by mouth daily at 12 noon., Disp: , Rfl:  .  hydrOXYzine (ATARAX/VISTARIL) 25 MG tablet, Take 1 tablet (25 mg total) by mouth 3 (three) times daily as needed for itching., Disp: 90 tablet, Rfl: 0 .  levonorgestrel (MIRENA) 20 MCG/24HR IUD, 1 each by Intrauterine route once., Disp: , Rfl:  .  losartan-hydrochlorothiazide (HYZAAR) 50-12.5 MG tablet, TAKE 1 TABLET BY MOUTH EVERY DAY (Patient taking differently: Take 1  tablet by mouth daily. ), Disp: 90 tablet, Rfl: 1 .  phentermine (ADIPEX-P) 37.5 MG tablet, Take 1 tablet (37.5 mg total) by mouth daily before breakfast., Disp: 30 tablet, Rfl: 2 .  Silver-Carboxymethylcellulose (AQUACEL-AG EXTRA HYDROFIBER) 6"X6" PADS, Apply 1 application topically every other day., Disp: 20 each, Rfl: 0 .  Syringe/Needle, Disp, (SYRINGE 3CC/25GX1") 25G X 1" 3 ML MISC, Use for b12 injections, Disp: 50 each, Rfl: 0   Objective:   Vitals:   11/29/18 1451  BP: (!) 146/85  Pulse: 69  Temp: 98.3 F (36.8 C)  SpO2: 99%    Physical Exam Vitals signs and nursing note reviewed.    Constitutional:      Appearance: Normal appearance.  HENT:     Head: Normocephalic.     Nose: Nose normal.     Mouth/Throat:     Mouth: Mucous membranes are moist.  Eyes:     Extraocular Movements: Extraocular movements intact.  Cardiovascular:     Rate and Rhythm: Normal rate.     Pulses: Normal pulses.  Pulmonary:     Effort: Pulmonary effort is normal.  Chest:    Abdominal:     General: Abdomen is flat.  Neurological:     Mental Status: She is alert.  Psychiatric:        Mood and Affect: Mood normal.        Thought Content: Thought content normal.        Judgment: Judgment normal.     Assessment & Plan:  Abscess of breast, left Recommend ACell placement to improve healing.  We discussed the pros and cons and options of a skin graft.  She would like to move ahead with the ACell for now.  She will come back on Monday. Mal[place], DO

## 2018-12-04 ENCOUNTER — Other Ambulatory Visit: Payer: Self-pay | Admitting: Internal Medicine

## 2018-12-04 MED ORDER — FLUCONAZOLE 150 MG PO TABS: 150.0000 mg | ORAL_TABLET | Freq: Every day | ORAL | 1 refills | Status: DC

## 2018-12-04 NOTE — Progress Notes (Signed)
le

## 2018-12-10 ENCOUNTER — Ambulatory Visit: Payer: BC Managed Care – PPO | Admitting: Plastic Surgery

## 2018-12-10 ENCOUNTER — Encounter: Payer: Self-pay | Admitting: Plastic Surgery

## 2018-12-10 VITALS — BP 135/84 | HR 83 | Temp 97.9°F | Ht 66.0 in | Wt 224.0 lb

## 2018-12-10 DIAGNOSIS — N611 Abscess of the breast and nipple: Secondary | ICD-10-CM | POA: Diagnosis not present

## 2018-12-10 NOTE — Progress Notes (Signed)
   Subjective:    Patient ID: Laura [name], female    DOB: Jun 16, 1971, 48 y.o.   MRN: [id number]  The patient is a 48 year old white female here with her husband for follow-up on her left breast wound.  She had donated ACell placed last week and is doing very well.  Most of it has incorporated.  She has not gotten it wet.  There is no sign of infection.  She is granulating and even getting some epithelialization.  She was a little bit worried about a greenish color which seems to be mostly yellow and normal for this period of time.     Review of Systems  Constitutional: Negative.   HENT: Negative.   Eyes: Negative.   Respiratory: Negative.   Gastrointestinal: Negative.   Genitourinary: Negative.   Musculoskeletal: Negative.   Skin: Positive for wound.       Objective:   Physical Exam Vitals signs and nursing note reviewed.  Constitutional:      Appearance: Normal appearance.  HENT:     Head: Normocephalic and atraumatic.  Cardiovascular:     Rate and Rhythm: Normal rate.  Neurological:     Mental Status: She is alert.  Psychiatric:        Mood and Affect: Mood normal.        Thought Content: Thought content normal.        Judgment: Judgment normal.        Assessment & Plan:  Abscess of breast, left  Additional donated a cell was applied in the powder form.  I would like to see her back next week and she can shower before she comes and get the area wet.

## 2018-12-11 ENCOUNTER — Ambulatory Visit: Payer: Self-pay | Admitting: Surgery

## 2018-12-17 ENCOUNTER — Ambulatory Visit: Payer: BC Managed Care – PPO | Admitting: Surgery

## 2018-12-17 ENCOUNTER — Encounter: Payer: Self-pay | Admitting: Plastic Surgery

## 2018-12-17 ENCOUNTER — Ambulatory Visit: Payer: BC Managed Care – PPO | Admitting: Plastic Surgery

## 2018-12-17 VITALS — BP 115/78 | HR 91 | Temp 99.2°F | Ht 66.0 in | Wt 218.0 lb

## 2018-12-17 DIAGNOSIS — S21002A Unspecified open wound of left breast, initial encounter: Secondary | ICD-10-CM | POA: Diagnosis not present

## 2018-12-17 NOTE — Progress Notes (Signed)
   Subjective:    Patient ID: Laura [name], female    DOB: [birth year], 48 y.o.   MRN: [id number]  Laura [name] is a 48 year old white female here for follow-up on her left breast wound.  She has been doing the [place] dressing changes daily.  She has very nice improvement in the wound.  The size is now 5 x 9 cm.  The base is flush with the surrounding skin.  She has good granulation tissue.  There is no sign of infection.  Review of Systems  Constitutional: Negative.   HENT: Negative.   Eyes: Negative.   Respiratory: Negative.   Gastrointestinal: Negative.   Endocrine: Negative.   Musculoskeletal: Negative.   Skin: Positive for wound.       Objective:   Physical Exam Vitals signs and nursing note reviewed.  Constitutional:      Appearance: Normal appearance.  HENT:     Head: Normocephalic.  Cardiovascular:     Rate and Rhythm: Normal rate.  Pulmonary:     Effort: Pulmonary effort is normal.  Neurological:     Mental Status: She is alert.  Psychiatric:        Mood and Affect: Mood normal.        Thought Content: Thought content normal.        Judgment: Judgment normal.       Assessment & Plan:  Open wound of left breast, initial encounter  Donated ACell was placed powder and sheet.  She is to continue with the [facility] dressing changes daily do not get it wet yet.  We will see her back in a week.  Continue with healthy eating and increasing protein.

## 2018-12-19 ENCOUNTER — Ambulatory Visit: Payer: BC Managed Care – PPO | Admitting: Internal Medicine

## 2018-12-19 ENCOUNTER — Encounter: Payer: Self-pay | Admitting: Internal Medicine

## 2018-12-19 DIAGNOSIS — S21002A Unspecified open wound of left breast, initial encounter: Secondary | ICD-10-CM

## 2018-12-19 DIAGNOSIS — E669 Obesity, unspecified: Secondary | ICD-10-CM

## 2018-12-19 MED ORDER — PHENTERMINE HCL 37.5 MG PO TABS: 37.5000 mg | ORAL_TABLET | Freq: Every day | ORAL | 2 refills | Status: DC

## 2018-12-19 NOTE — Assessment & Plan Note (Signed)
Managed by Dr Marla Roe; progressing as expected. Will postpone mammogram until Dr. Marla Roe releases her

## 2018-12-19 NOTE — Progress Notes (Signed)
Subjective:  Patient ID: Laura [name], female    DOB: Jan 26, 1971  Age: 48 y.o. MRN: [id number]  CC: Diagnoses of Obesity (BMI 35.0-39.9 without comorbidity) and Open wound of left breast, initial encounter were pertinent to this visit.  HPI Laura [name] presents for follow up on weight management with use of phentermine.  She has not taken phentermine in several months and wishes to resume the medication.  She has had a mild Weight gain noted since November  . Stopped phentermine in december around the time of her hospitalization for breast infection requiring multpel rounds of antibiiotics and surgical debridements.  She has had difficulty losing weight due to increased appetite  She has taken phentermine in the recent past without  side effects and risks and understands that    The medication will be discontinued if she has not lost 5% of her body weight over the next 3 months, which , based on today's weight is 11 lbs.  She is still taking a daily probiotic and a collagen supplement.  Has not required  antibiotics since Dec 16.  No diarrhea reported. Some breast pain aroudn the site of surgery.   Seeing Plastic surgery weekly and wound is improving.  NO new issues.    Outpatient Medications Prior to Visit  Medication Sig Dispense Refill  . acetaminophen (TYLENOL) 500 MG tablet Take 2 tablets (1,000 mg total) by mouth every 6 (six) hours as needed for mild pain or fever. 30 tablet 0  . ALPRAZolam (XANAX) 0.5 MG tablet Take 1 tablet (0.5 mg total) by mouth at bedtime as needed for anxiety. 30 tablet 3  . bisacodyl (DULCOLAX) 5 MG EC tablet Take 2 tablets (10 mg total) by mouth daily as needed for mild constipation. 30 tablet 0  . cyanocobalamin (,VITAMIN B-12,) 1000 MCG/ML injection Inject 1 mL (1,000 mcg total) into the muscle once a week. 10 mL 6  . desvenlafaxine (PRISTIQ) 50 MG 24 hr tablet TAKE 1 TABLET BY MOUTH EVERY DAY (Patient taking differently: Take 50 mg by mouth every evening.  ) 90 tablet 1  . esomeprazole (NEXIUM) 20 MG capsule Take 20 mg by mouth daily at 12 noon.    . hydrOXYzine (ATARAX/VISTARIL) 25 MG tablet Take 1 tablet (25 mg total) by mouth 3 (three) times daily as needed for itching. 90 tablet 0  . levonorgestrel (MIRENA) 20 MCG/24HR IUD 1 each by Intrauterine route once.    Marland Kitchen losartan-hydrochlorothiazide (HYZAAR) 50-12.5 MG tablet TAKE 1 TABLET BY MOUTH EVERY DAY (Patient taking differently: Take 1 tablet by mouth daily. ) 90 tablet 1  . Silver-Carboxymethylcellulose (AQUACEL-AG EXTRA HYDROFIBER) 6"X6" PADS Apply 1 application topically every other day. 20 each 0  . Syringe/Needle, Disp, (SYRINGE 3CC/25GX1") 25G X 1" 3 ML MISC Use for b12 injections 50 each 0  . phentermine (ADIPEX-P) 37.5 MG tablet Take 1 tablet (37.5 mg total) by mouth daily before breakfast. 30 tablet 2  . fluconazole (DIFLUCAN) 150 MG tablet Take 1 tablet (150 mg total) by mouth daily. (Patient not taking: Reported on 12/19/2018) 2 tablet 1   No facility-administered medications prior to visit.     Review of Systems;  Patient denies headache, fevers, malaise, unintentional weight loss, skin rash, eye pain, sinus congestion and sinus pain, sore throat, dysphagia,  hemoptysis , cough, dyspnea, wheezing, chest pain, palpitations, orthopnea, edema, abdominal pain, nausea, melena, diarrhea, constipation, flank pain, dysuria, hematuria, urinary  Frequency, nocturia, numbness, tingling, seizures,  Focal weakness, Loss of consciousness,  Tremor, insomnia, depression, anxiety, and suicidal ideation.      Objective:  BP 110/82 (BP Location: Left Arm, Patient Position: Sitting, Cuff Size: Large)   Pulse 79   Temp 98.2 F (36.8 C) (Oral)   Resp 16   Ht 5\' 6"  (1.676 m)   Wt 223 lb 3.2 oz (101.2 kg)   SpO2 98%   BMI 36.03 kg/m   BP Readings from Last 3 Encounters:  12/19/18 110/82  12/17/18 115/78  12/10/18 135/84    Wt Readings from Last 3 Encounters:  12/19/18 223 lb 3.2 oz (101.2  kg)  12/17/18 218 lb (98.9 kg)  12/10/18 224 lb (101.6 kg)    General appearance: alert, cooperative and appears stated age  Neck: no adenopathy, no carotid bruit, supple, symmetrical, trachea midline and thyroid not enlarged, symmetric, no tenderness/mass/nodules Back: symmetric, no curvature. ROM normal. No CVA tenderness. Lungs: clear to auscultation bilaterally Heart: regular rate and rhythm, S1, S2 normal, no murmur, click, rub or gallop Pulses: 2+ and symmetric Skin: breast exam deferred   .  Elsewhere  Skin color, texture, turgor normal. No rashes or lesions Lymph nodes: Cervical, supraclavicular, and axillary nodes normal.  Lab Results  Component Value Date   HGBA1C 5.5 09/18/2018   HGBA1C 5.3 04/01/2018   HGBA1C 5.6 11/22/2016    Lab Results  Component Value Date   CREATININE 0.81 11/08/2018   CREATININE 0.84 11/06/2018   CREATININE 0.97 11/05/2018    Lab Results  Component Value Date   WBC 10.3 11/06/2018   HGB 13.6 11/06/2018   HCT 40.9 11/06/2018   PLT 315 11/06/2018   GLUCOSE 87 11/08/2018   CHOL 138 09/18/2018   TRIG 115.0 09/18/2018   HDL 56.10 09/18/2018   LDLDIRECT 59.0 11/22/2016   LDLCALC 59 09/18/2018   ALT 22 10/31/2018   AST 21 10/31/2018   NA 140 11/08/2018   K 4.2 11/08/2018   CL 106 11/08/2018   CREATININE 0.81 11/08/2018   BUN 12 11/08/2018   CO2 27 11/08/2018   TSH 2.26 04/01/2018   HGBA1C 5.5 09/18/2018   MICROALBUR 0.9 11/22/2016    Ct Chest W Contrast  Result Date: 11/05/2018 CLINICAL DATA:  Status post I & D of large left breast abscess 5 days prior. Patient presents with left chest wall pain, erythema and fever with sweats and chills. EXAM: CT CHEST WITH CONTRAST TECHNIQUE: Multidetector CT imaging of the chest was performed during intravenous contrast administration. CONTRAST:  10mL OMNIPAQUE IOHEXOL 300 MG/ML  SOLN COMPARISON:  None. FINDINGS: Cardiovascular: Normal heart size. No significant pericardial effusion/thickening.  Great vessels are normal in course and caliber. No central pulmonary emboli. Mediastinum/Nodes: No discrete thyroid nodules. Unremarkable esophagus. Mild left axillary adenopathy measuring up to 1.1 cm (series 2/image 48). No right axillary adenopathy. No pathologically enlarged mediastinal or hilar nodes. Lungs/Pleura: No pneumothorax. No pleural effusion. No acute consolidative airspace disease or lung masses. Peripheral right lower lobe 4 mm solid pulmonary nodule (series 3/image 70). No additional significant pulmonary nodules. Upper abdomen: No acute abnormality. Musculoskeletal: No aggressive appearing focal osseous lesions. Minimal thoracic spondylosis. Prominent asymmetric skin thickening throughout the left breast. No fluid collections in the chest wall. IMPRESSION: 1. Nonspecific prominent asymmetric skin thickening throughout the left breast. No chest wall fluid collections. 2. Nonspecific mild left axillary lymphadenopathy. 3. Solitary 4 mm solid right lower lobe pulmonary nodule. No follow-up needed if patient is low-risk. Non-contrast chest CT can be considered in 12 months if patient is high-risk. This recommendation  follows the consensus statement: Guidelines for Management of Incidental Pulmonary Nodules Detected on CT Images:From the Fleischner Society 2017; published online before print (10.1148/radiol.[id number]). Electronically Signed   By: Ilona Sorrel M.D.   On: 11/05/2018 19:09    Assessment & Plan:   Problem List Items Addressed This Visit    Obesity (BMI 35.0-39.9 without comorbidity)    We are resuming pharmacotherapy with phentermine.  Goal is 11 lb weight loss over the next 3 months.  Diet and exercise plan reviewed       Relevant Medications   phentermine (ADIPEX-P) 37.5 MG tablet   Open wound of left breast    Managed by Dr Marla Roe; progressing as expected. Will postpone mammogram until Dr. Marla Roe releases her          I am having Aniyah A. Corinna Capra "Orson Slick" maintain  her levonorgestrel, esomeprazole, ALPRAZolam, cyanocobalamin, SYRINGE 3CC/25GX1", desvenlafaxine, losartan-hydrochlorothiazide, bisacodyl, acetaminophen, AQUACEL-AG EXTRA HYDROFIBER, hydrOXYzine, fluconazole, and phentermine.  Meds ordered this encounter  Medications  . phentermine (ADIPEX-P) 37.5 MG tablet    Sig: Take 1 tablet (37.5 mg total) by mouth daily before breakfast.    Dispense:  30 tablet    Refill:  2    Medications Discontinued During This Encounter  Medication Reason  . phentermine (ADIPEX-P) 37.5 MG tablet Reorder    Follow-up: No follow-ups on file.   Crecencio Mc, MD

## 2018-12-19 NOTE — Assessment & Plan Note (Signed)
We are resuming pharmacotherapy with phentermine.  Goal is 11 lb weight loss over the next 3 months.  Diet and exercise plan reviewed

## 2018-12-19 NOTE — Patient Instructions (Addendum)
Try [employer]  Very mild and quick to cook . Use in fish tacos   ([employer])   To make a low carb chip :  Take the Joseph's Lavash or Pita bread,  Or the [place] Low carb whole wheat tortilla   Place on metal cookie sheet  Brush with olive oil  Sprinkle garlic powder (NOT garlic salt), grated parmesan cheese, mediterranean seasoning , or all of them?  Bake at 275 for 30 minutes   We have substitutions for your potatoes!!  Try the mashed cauliflower and riced cauliflower dishes instead of rice and mashed potatoes  [employer] brand   Mashed turnips are also very low carb!   For desserts :  Try the Dannon Lt n Fit greek yogurt dessert flavors and top with reddi Whip .  8 carbs,  80 calories  Try Oikos Triple Zero [place] Yogurt in the salted caramel, and the coffee flavors  With Whipped Cream for dessert  breyer's low carb ice cream, available in bars (on a stick, better ) or scoopable ice cream  HERE ARE THE LOW CARB  BREAD CHOICES

## 2018-12-24 ENCOUNTER — Ambulatory Visit: Payer: BC Managed Care – PPO | Admitting: Plastic Surgery

## 2018-12-24 ENCOUNTER — Encounter: Payer: Self-pay | Admitting: Plastic Surgery

## 2018-12-24 VITALS — BP 132/85 | HR 83 | Temp 99.0°F | Ht 66.0 in | Wt 223.0 lb

## 2018-12-24 DIAGNOSIS — S21002A Unspecified open wound of left breast, initial encounter: Secondary | ICD-10-CM

## 2018-12-24 NOTE — Progress Notes (Signed)
The patient is a 48 year old female here for follow-up on her left breast wound.  She did really well this week.  The wound has decreased tremendously in size.  She has excellent granulation tissue.  It looks very healthy she is starting to epithelialize but is a little bit slow.  Recommend collagen at this time, Prisma. I would like to see her back in one week.

## 2019-01-01 ENCOUNTER — Ambulatory Visit (INDEPENDENT_AMBULATORY_CARE_PROVIDER_SITE_OTHER): Payer: BC Managed Care – PPO | Admitting: Surgery

## 2019-01-01 ENCOUNTER — Encounter: Payer: Self-pay | Admitting: Surgery

## 2019-01-01 ENCOUNTER — Other Ambulatory Visit: Payer: Self-pay

## 2019-01-01 VITALS — BP 124/80 | HR 82 | Temp 97.5°F | Ht 66.0 in | Wt 228.4 lb

## 2019-01-01 DIAGNOSIS — M7989 Other specified soft tissue disorders: Secondary | ICD-10-CM | POA: Diagnosis not present

## 2019-01-01 DIAGNOSIS — Z09 Encounter for follow-up examination after completed treatment for conditions other than malignant neoplasm: Secondary | ICD-10-CM | POA: Diagnosis not present

## 2019-01-01 DIAGNOSIS — N611 Abscess of the breast and nipple: Secondary | ICD-10-CM

## 2019-01-01 NOTE — Patient Instructions (Signed)
Patient is to return to the office as needed. Continue to see Dr.Dilingham for wound care.   Call the office with any questions or concerns.

## 2019-01-01 NOTE — Progress Notes (Signed)
01/01/2019  HPI: Laura [name] is a 48 y.o. female s/p debridement of left breast abscess and necrotizing infection on 11/05/18.  She has been followed by Dr. Marla Roe and her wound has been healing really well.  Denies any pain, redness of the skin, drainage, or other concerns.  Vital signs: BP 124/80   Pulse 82   Temp (!) 97.5 F (36.4 C) (Temporal)   Ht 5\' 6"  (1.676 m)   Wt 228 lb 6.4 oz (103.6 kg)   SpO2 97%   BMI 36.86 kg/m    Physical Exam: Constitutional: No acute distress Skin:  Left breast debridement site healing well, now much smaller compared to before, and the wound is much more superficial.  Open area measures about 7 cm x 2 cm.  Assessment/Plan: This is a 48 y.o. female s/p debridement of left breast abscess and necrotizing infection.  --Patient is healing well and there is no evidence of any recurrence. --She should continue follow up with Dr. Marla Roe.   --She may follow up with [place] prn.   Melvyn Neth, [facility]

## 2019-01-07 ENCOUNTER — Encounter: Payer: Self-pay | Admitting: Physician Assistant

## 2019-01-07 ENCOUNTER — Ambulatory Visit: Payer: BC Managed Care – PPO | Admitting: Physician Assistant

## 2019-01-07 VITALS — BP 135/85 | HR 85 | Temp 98.5°F | Ht 66.0 in | Wt 228.0 lb

## 2019-01-07 DIAGNOSIS — N611 Abscess of the breast and nipple: Secondary | ICD-10-CM

## 2019-01-07 NOTE — Progress Notes (Signed)
  Subjective:     Patient ID: Laura [name], female   DOB: 29-May-1971, 48 y.o.   MRN: [id number]  HPI  Very pleasant 48 year old female pt presents to the clinic for f/u of left lateral breast wound.   Pt had incision and drainage of an abscess on 11/05/18. The pt has been compliant with using collagen dressings prescribed by Dr. Marla Roe daily.  The pt is very pleased with the wound healing.  The wound has decreased in size since her last OV.  She denies pain or discomfort. Review of Systems  Constitutional: Negative.   Respiratory: Negative.   Cardiovascular: Negative.   Gastrointestinal: Negative.   Skin: Positive for wound.  Psychiatric/Behavioral: Negative.        Objective:   Physical Exam Constitutional:      Appearance: Normal appearance.  Pulmonary:     Effort: Pulmonary effort is normal.  Abdominal:     Palpations: Abdomen is soft.  Skin:    General: Skin is warm and dry.  Neurological:     Mental Status: She is alert and oriented to person, place, and time.  Psychiatric:        Mood and Affect: Mood normal.        Behavior: Behavior normal.        Thought Content: Thought content normal.        Judgment: Judgment normal.   Wound is 4 x1.5cm Granulation tissue is present Scarring present around the wound     Assessment:     Left lateral breast wound    Plan:       Pt will continue to use Collagen patches After healing pt may use cocoa butter or mederma to decrease scarring Advised against exposure to the sun Pt will return to clinic in 3 weeks

## 2019-01-26 ENCOUNTER — Other Ambulatory Visit: Payer: Self-pay | Admitting: Internal Medicine

## 2019-01-31 ENCOUNTER — Ambulatory Visit: Payer: BC Managed Care – PPO | Admitting: Plastic Surgery

## 2019-03-13 ENCOUNTER — Other Ambulatory Visit: Payer: Self-pay | Admitting: Internal Medicine

## 2019-03-13 NOTE — Telephone Encounter (Signed)
Refilled: 12/19/2018 Last OV: 01/07/2019 Next OV: 03/20/2019

## 2019-03-20 ENCOUNTER — Other Ambulatory Visit: Payer: Self-pay

## 2019-03-20 ENCOUNTER — Ambulatory Visit (INDEPENDENT_AMBULATORY_CARE_PROVIDER_SITE_OTHER): Payer: BC Managed Care – PPO | Admitting: Internal Medicine

## 2019-03-20 ENCOUNTER — Encounter: Payer: Self-pay | Admitting: Internal Medicine

## 2019-03-20 DIAGNOSIS — F411 Generalized anxiety disorder: Secondary | ICD-10-CM

## 2019-03-20 DIAGNOSIS — E669 Obesity, unspecified: Secondary | ICD-10-CM | POA: Diagnosis not present

## 2019-03-20 DIAGNOSIS — F419 Anxiety disorder, unspecified: Secondary | ICD-10-CM | POA: Diagnosis not present

## 2019-03-20 DIAGNOSIS — Z1231 Encounter for screening mammogram for malignant neoplasm of breast: Secondary | ICD-10-CM | POA: Diagnosis not present

## 2019-03-20 DIAGNOSIS — Z7189 Other specified counseling: Secondary | ICD-10-CM

## 2019-03-20 DIAGNOSIS — F5105 Insomnia due to other mental disorder: Secondary | ICD-10-CM

## 2019-03-20 DIAGNOSIS — G5701 Lesion of sciatic nerve, right lower limb: Secondary | ICD-10-CM

## 2019-03-20 MED ORDER — METHOCARBAMOL 750 MG PO TABS: 750.0000 mg | ORAL_TABLET | Freq: Four times a day (QID) | ORAL | 2 refills | Status: DC

## 2019-03-20 MED ORDER — PHENTERMINE HCL 37.5 MG PO TABS: ORAL_TABLET | ORAL | 2 refills | Status: DC

## 2019-03-20 MED ORDER — LOSARTAN POTASSIUM-HCTZ 50-12.5 MG PO TABS: 1.0000 | ORAL_TABLET | Freq: Every day | ORAL | 1 refills | Status: DC

## 2019-03-20 NOTE — Patient Instructions (Signed)
Sending generic robaxin for muscle spasms to pharmacy  Your annual mammogram has been ordered.  You are encouraged (required) to call to make your appointment at [facility]  Please schedule a 3 month follow up and plan to have fasting labs prior to your visit

## 2019-03-23 DIAGNOSIS — Z7189 Other specified counseling: Secondary | ICD-10-CM | POA: Insufficient documentation

## 2019-03-23 DIAGNOSIS — G5701 Lesion of sciatic nerve, right lower limb: Secondary | ICD-10-CM | POA: Insufficient documentation

## 2019-03-23 NOTE — Assessment & Plan Note (Signed)
We are continuing pharmacotherapy with phentermine.  Goal is 11 lb weight loss over the next 3 months.  Diet and exercise plan reviewed

## 2019-03-23 NOTE — Assessment & Plan Note (Signed)
Improved with Pristiq.  No weight gain.  Refills given

## 2019-03-23 NOTE — Assessment & Plan Note (Signed)
Managed with prn use of alprazolam . The risks and benefits of  Chronic  benzodiazepine use were discussed with patient today including increased risk of dementia,  Addiction, and seizures if abruptly withdrawn  .

## 2019-03-23 NOTE — Assessment & Plan Note (Signed)
Educated patient on the signs and symptoms of COVID-19 infection and ways to avoid the viral infection including washing hands frequently with soap and water,  using hand sanitizer if unable to wash, avoiding touching face,  staying at home and limiting visitors,  and avoiding contact with people coming in and out of home.  Reminded patient to call office with questions/concerns.  The importance of social distancing was discussed today 

## 2019-03-23 NOTE — Assessment & Plan Note (Signed)
Chronic,  Aggravated by lack of massage therapy.  Adding muscle relaxer.

## 2019-03-23 NOTE — Progress Notes (Signed)
Virtual Visit via Doxy,me  Thiis visit type was conducted due to national recommendations for restrictions regarding the COVID-19 pandemic (e.g. social distancing).  This format is felt to be most appropriate for this patient at this time.  All issues noted in this document were discussed and addressed.  No physical exam was performed (except for noted visual exam findings with Video Visits).   I connected with@ on 03/20/19 at  8:00 AM EDT by a video enabled telemedicine application  And  verified that I am speaking with the correct person using two identifiers. Location patient: home Location provider: work  Persons participating in the virtual visit: patient, provider  I discussed the limitations, risks, security and privacy concerns of performing an evaluation and management service by telephone and the availability of in person appointments. I also discussed with the patient that there may be a patient responsible charge related to this service. The patient expressed understanding and agreed to proceed.  Reason for visit: follow up on pharmacotherapy for obesity and other issues, including hypertension and depression   HPI:  48 yr old female with history of obesity managed with phentermine , presents with persistent back and buttock pain attributed to pyriformis syndrome.  Previously managed with massage therapy, which has been involuntarily suspended due to the COVID 19 epidemic.   HTN:  Patient is taking her medications as prescribed and notes no adverse effects.  Home BP readings have been done about once per week and are  generally < 130/80 .  She is avoiding added salt in her diet and walking regularly about 3 times per week for exercise  .  She has been taking phentermine for the past several months as part of a comprehensive program to lose weight which includes regular participation in aerobic exercise 5 days per week and a carbohydrate restricted diet.  She has had no adverse effects  from the phentermine . Her weight has plateaued during the pandemic due to change in routine    ROS: See pertinent positives and negatives per HPI.  Past Medical History:  Diagnosis Date  . Allergy   . Dysplasia of cervix   . Herpes genitalia   . Hypertension    HX of high readings   . Increased BMI   . Menstrual migraine     Past Surgical History:  Procedure Laterality Date  . DILATION AND CURETTAGE OF UTERUS  2014 FEB and August  . INCISION AND DRAINAGE ABSCESS Left 10/31/2018   Procedure: INCISION AND DRAINAGE LEFT BREAST ABSCESS;  Surgeon: Olean Ree, MD;  Location: [facility] ORS;  Service: General;  Laterality: Left;  . IRRIGATION AND DEBRIDEMENT ABSCESS Left 11/05/2018   Procedure: IRRIGATION AND DEBRIDEMENT ABSCESS - BREAST;  Surgeon: Olean Ree, MD;  Location: [facility] ORS;  Service: General;  Laterality: Left;  . MOUTH SURGERY    . TONSILECTOMY/ADENOIDECTOMY WITH MYRINGOTOMY Bilateral 2011    Family History  Problem Relation Age of Onset  . Arthritis Mother   . Hypertension Mother   . Diabetes Mother   . Heart disease Father   . Hyperlipidemia Father   . Hypertension Father   . Cancer Neg Hx   . Breast cancer Neg Hx     SOCIAL HX: married,  Hotel manager. No alcohol or tobacco    Current Outpatient Medications:  .  acetaminophen (TYLENOL) 500 MG tablet, Take 2 tablets (1,000 mg total) by mouth every 6 (six) hours as needed for mild pain or fever., Disp: 30 tablet, Rfl: 0 .  ALPRAZolam (XANAX) 0.5 MG tablet, Take 1 tablet (0.5 mg total) by mouth at bedtime as needed for anxiety., Disp: 30 tablet, Rfl: 3 .  cyanocobalamin (,VITAMIN B-12,) 1000 MCG/ML injection, Inject 1 mL (1,000 mcg total) into the muscle once a week., Disp: 10 mL, Rfl: 6 .  desvenlafaxine (PRISTIQ) 50 MG 24 hr tablet, TAKE 1 TABLET BY MOUTH EVERY DAY, Disp: 90 tablet, Rfl: 1 .  esomeprazole (NEXIUM) 20 MG capsule, Take 20 mg by mouth daily at 12 noon., Disp: , Rfl:  .  levonorgestrel (MIRENA)  20 MCG/24HR IUD, 1 each by Intrauterine route once., Disp: , Rfl:  .  losartan-hydrochlorothiazide (HYZAAR) 50-12.5 MG tablet, Take 1 tablet by mouth daily., Disp: 90 tablet, Rfl: 1 .  phentermine (ADIPEX-P) 37.5 MG tablet, TAKE 1 TABLET BY MOUTH DAILY BEFORE BREAKFAST, Disp: 30 tablet, Rfl: 2 .  Syringe/Needle, Disp, (SYRINGE 3CC/25GX1") 25G X 1" 3 ML MISC, Use for b12 injections, Disp: 50 each, Rfl: 0 .  hydrOXYzine (ATARAX/VISTARIL) 25 MG tablet, Take 1 tablet (25 mg total) by mouth 3 (three) times daily as needed for itching. (Patient not taking: Reported on 03/20/2019), Disp: 90 tablet, Rfl: 0 .  methocarbamol (ROBAXIN) 750 MG tablet, Take 1 tablet (750 mg total) by mouth 4 (four) times daily., Disp: 60 tablet, Rfl: 2  EXAM:  VITALS per patient if applicable:  GENERAL: alert, oriented, appears well and in no acute distress  HEENT: atraumatic, conjunttiva clear, no obvious abnormalities on inspection of external nose and ears  NECK: normal movements of the head and neck  LUNGS: on inspection no signs of respiratory distress, breathing rate appears normal, no obvious gross SOB, gasping or wheezing  CV: no obvious cyanosis  MS: moves all visible extremities without noticeable abnormality  PSYCH/NEURO: pleasant and cooperative, no obvious depression or anxiety, speech and thought processing grossly intact  ASSESSMENT AND PLAN:  Discussed the following assessment and plan:  Breast cancer screening by mammogram - Plan: MM 3D SCREEN BREAST BILATERAL  Insomnia secondary to anxiety  Obesity (BMI 35.0-39.9 without comorbidity)  GAD (generalized anxiety disorder)  Pyriformis syndrome, right  Educated About Covid-19 Virus Infection  Insomnia secondary to anxiety Managed with prn use of alprazolam . The risks and benefits of  Chronic  benzodiazepine use were discussed with patient today including increased risk of dementia,  Addiction, and seizures if abruptly withdrawn  .     Obesity (BMI 35.0-39.9 without comorbidity) We are continuing pharmacotherapy with phentermine.  Goal is 11 lb weight loss over the next 3 months.  Diet and exercise plan reviewed   GAD (generalized anxiety disorder) Improved with Pristiq.  No weight gain.  Refills given   Pyriformis syndrome, right Chronic,  Aggravated by lack of massage therapy.  Adding muscle relaxer.  Educated About Covid-19 Virus Infection Educated patient on the signs and symptoms of COVID-19 infection and ways to avoid the viral infection including washing hands frequently with soap and water,  using hand sanitizer if unable to wash, avoiding touching face,  staying at home and limiting visitors,  and avoiding contact with people coming in and out of home.  Reminded patient to call office with questions/concerns.  The importance of social distancing was discussed today    I discussed the assessment and treatment plan with the patient. The patient was provided an opportunity to ask questions and all were answered. The patient agreed with the plan and demonstrated an understanding of the instructions.   The patient was advised to call  back or seek an in-person evaluation if the symptoms worsen or if the condition fails to improve as anticipated.  I provided 25 minutes of non-face-to-face time during this encounter.   Laura Mc, MD

## 2019-04-08 ENCOUNTER — Encounter: Payer: Self-pay | Admitting: Internal Medicine

## 2019-05-12 ENCOUNTER — Other Ambulatory Visit: Payer: Self-pay | Admitting: Internal Medicine

## 2019-05-12 ENCOUNTER — Other Ambulatory Visit: Payer: Self-pay

## 2019-05-12 ENCOUNTER — Ambulatory Visit
Admission: RE | Admit: 2019-05-12 | Discharge: 2019-05-12 | Disposition: A | Discharge status: Home / Self Care | Payer: BC Managed Care – PPO | Source: Ambulatory Visit | Attending: Internal Medicine | Admitting: Internal Medicine

## 2019-05-12 DIAGNOSIS — N6489 Other specified disorders of breast: Secondary | ICD-10-CM

## 2019-05-12 DIAGNOSIS — Z1231 Encounter for screening mammogram for malignant neoplasm of breast: Secondary | ICD-10-CM

## 2019-05-12 DIAGNOSIS — R928 Other abnormal and inconclusive findings on diagnostic imaging of breast: Secondary | ICD-10-CM

## 2019-05-20 ENCOUNTER — Ambulatory Visit
Admission: RE | Admit: 2019-05-20 | Discharge: 2019-05-20 | Disposition: A | Discharge status: Home / Self Care | Payer: BC Managed Care – PPO | Source: Ambulatory Visit | Attending: Internal Medicine | Admitting: Internal Medicine

## 2019-05-20 ENCOUNTER — Other Ambulatory Visit: Payer: Self-pay

## 2019-05-20 DIAGNOSIS — R928 Other abnormal and inconclusive findings on diagnostic imaging of breast: Secondary | ICD-10-CM

## 2019-05-20 DIAGNOSIS — N6489 Other specified disorders of breast: Secondary | ICD-10-CM

## 2019-07-01 ENCOUNTER — Encounter: Payer: Self-pay | Admitting: Plastic Surgery

## 2019-07-11 ENCOUNTER — Encounter: Payer: Self-pay | Admitting: Plastic Surgery

## 2019-07-11 ENCOUNTER — Ambulatory Visit: Payer: BC Managed Care – PPO | Admitting: Plastic Surgery

## 2019-07-11 ENCOUNTER — Other Ambulatory Visit: Payer: Self-pay

## 2019-07-11 VITALS — BP 140/82 | HR 85 | Temp 98.7°F | Ht 66.0 in | Wt 232.0 lb

## 2019-07-11 DIAGNOSIS — M7989 Other specified soft tissue disorders: Secondary | ICD-10-CM | POA: Diagnosis not present

## 2019-07-11 DIAGNOSIS — N6489 Other specified disorders of breast: Secondary | ICD-10-CM | POA: Diagnosis not present

## 2019-07-11 DIAGNOSIS — S21002D Unspecified open wound of left breast, subsequent encounter: Secondary | ICD-10-CM

## 2019-07-11 NOTE — Progress Notes (Signed)
   Subjective:    Patient ID: Laura [name], female    DOB: Feb 05, 1971, 48 y.o.   MRN: [id number]  The patient is a 48 year old female here for follow-up after undergoing multiple treatments for a left breast necrotizing infection.  The infection and skin has healed.  She is left with a contracture of the left breast.  She now has significant asymmetry between her breasts.  I do not feel any lumps or bumps of any concern.  Nothing makes it worse.  She has been healed since February.     Review of Systems  Constitutional: Negative for activity change and appetite change.  HENT: Negative.   Respiratory: Negative for chest tightness and shortness of breath.   Cardiovascular: Negative for leg swelling.  Gastrointestinal: Negative for abdominal pain.  Endocrine: Negative.   Genitourinary: Negative.   Musculoskeletal: Negative for back pain and neck pain.  Neurological: Negative.   Hematological: Negative.   Psychiatric/Behavioral: Negative.        Objective:   Physical Exam Vitals signs and nursing note reviewed.  Constitutional:      Appearance: Normal appearance.  HENT:     Head: Normocephalic and atraumatic.  Eyes:     Extraocular Movements: Extraocular movements intact.  Neck:     Musculoskeletal: Normal range of motion.  Cardiovascular:     Rate and Rhythm: Normal rate.     Pulses: Normal pulses.  Pulmonary:     Effort: Pulmonary effort is normal. No respiratory distress.     Breath sounds: No wheezing.  Chest:    Neurological:     General: No focal deficit present.     Mental Status: She is alert. Mental status is at baseline.  Psychiatric:        Mood and Affect: Mood normal.        Thought Content: Thought content normal.         Assessment & Plan:     ICD-10-CM   1. Open wound of left breast, subsequent encounter  S21.002D   2. Necrotizing soft tissue infection  M79.89   3. Postoperative breast asymmetry  N64.89   Recommend bilateral mastopexy with  excision of left breast scar contracture for correction of asymmetry. We will need to be sure she has an updated mammogram. Pictures were obtained of the patient and placed in the chart with the patient's or guardian's permission.

## 2019-07-27 ENCOUNTER — Other Ambulatory Visit: Payer: Self-pay | Admitting: Internal Medicine

## 2019-08-21 ENCOUNTER — Encounter: Payer: Self-pay | Admitting: Plastic Surgery

## 2019-08-21 HISTORY — PX: REDUCTION MAMMAPLASTY: SUR839

## 2019-08-24 ENCOUNTER — Other Ambulatory Visit: Payer: Self-pay | Admitting: Internal Medicine

## 2019-08-25 ENCOUNTER — Other Ambulatory Visit: Payer: Self-pay | Admitting: Internal Medicine

## 2019-08-25 NOTE — Telephone Encounter (Signed)
Refilled: 03/20/2019 Last OV: 03/20/2019 Next OV: not scheduled

## 2019-08-26 ENCOUNTER — Other Ambulatory Visit: Payer: Self-pay

## 2019-08-26 ENCOUNTER — Ambulatory Visit (INDEPENDENT_AMBULATORY_CARE_PROVIDER_SITE_OTHER): Payer: BC Managed Care – PPO | Admitting: Surgical

## 2019-08-26 ENCOUNTER — Encounter: Payer: Self-pay | Admitting: Surgical

## 2019-08-26 VITALS — BP 127/82 | HR 95 | Temp 97.7°F | Ht 66.0 in | Wt 231.4 lb

## 2019-08-26 DIAGNOSIS — N6489 Other specified disorders of breast: Secondary | ICD-10-CM

## 2019-08-26 DIAGNOSIS — M7989 Other specified soft tissue disorders: Secondary | ICD-10-CM

## 2019-08-26 MED ORDER — ONDANSETRON HCL 4 MG PO TABS: 4.0000 mg | ORAL_TABLET | Freq: Three times a day (TID) | ORAL | 0 refills | Status: DC | PRN

## 2019-08-26 MED ORDER — HYDROCODONE-ACETAMINOPHEN 5-325 MG PO TABS: 1.0000 | ORAL_TABLET | Freq: Four times a day (QID) | ORAL | 0 refills | Status: AC | PRN

## 2019-08-26 MED ORDER — CEPHALEXIN 500 MG PO CAPS: 500.0000 mg | ORAL_CAPSULE | Freq: Two times a day (BID) | ORAL | 0 refills | Status: AC

## 2019-08-26 NOTE — H&P (View-Only) (Signed)
Patient ID: Laura [name], female    DOB: November 24, 1970, 48 y.o.   MRN: [id number]  Chief Complaint  Patient presents with  . Pre-op Exam    for (B) breast mastopexy w/(L) breast scar contracture excision      ICD-10-CM   1. Necrotizing soft tissue infection  M79.89   2. Postoperative breast asymmetry  N64.89     History of Present Illness: Laura [name] is a 48 y.o.  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  She presents for preoperative evaluation for upcoming procedure, bilateral mastopexy with excision of left breast scar contracture correction for symmetry, scheduled for 09/10/19 with Dr. Marla Roe.  The patient has not had problems with anesthesia, tolerated prior procedures without any issues.  She has a history of essential hypertension which is well controlled. She has never smoked. Denies any hx or fmhx of DVT/PE. No recent colds or illnesses.   She is currently a 31 DD.  Past Medical History: Allergies: Allergies  Allergen Reactions  . Bee Venom     Current Medications:  Current Outpatient Medications:  .  acetaminophen (TYLENOL) 500 MG tablet, Take 2 tablets (1,000 mg total) by mouth every 6 (six) hours as needed for mild pain or fever., Disp: 30 tablet, Rfl: 0 .  ALPRAZolam (XANAX) 0.5 MG tablet, Take 1 tablet (0.5 mg total) by mouth at bedtime as needed for anxiety., Disp: 30 tablet, Rfl: 3 .  cyanocobalamin (,VITAMIN B-12,) 1000 MCG/ML injection, INJECT 1 ML (1,000 MCG TOTAL) INTO THE MUSCLE ONCE A WEEK., Disp: 12 mL, Rfl: 5 .  desvenlafaxine (PRISTIQ) 50 MG 24 hr tablet, TAKE 1 TABLET BY MOUTH EVERY DAY, Disp: 90 tablet, Rfl: 1 .  esomeprazole (NEXIUM) 20 MG capsule, Take 20 mg by mouth daily at 12 noon., Disp: , Rfl:  .  levonorgestrel (MIRENA) 20 MCG/24HR IUD, 1 each by Intrauterine route once., Disp: , Rfl:  .  losartan-hydrochlorothiazide (HYZAAR) 50-12.5 MG  tablet, Take 1 tablet by mouth daily., Disp: 90 tablet, Rfl: 1 .  methocarbamol (ROBAXIN) 750 MG tablet, Take 1 tablet (750 mg total) by mouth 4 (four) times daily., Disp: 60 tablet, Rfl: 2 .  phentermine (ADIPEX-P) 37.5 MG tablet, TAKE 1 TABLET BY MOUTH DAILY BEFORE BREAKFAST, Disp: 30 tablet, Rfl: 2 .  Syringe/Needle, Disp, (SYRINGE 3CC/25GX1") 25G X 1" 3 ML MISC, Use for b12 injections, Disp: 50 each, Rfl: 0 .  cephALEXin (KEFLEX) 500 MG capsule, Take 1 capsule (500 mg total) by mouth 2 (two) times daily for 3 days., Disp: 6 capsule, Rfl: 0 .  HYDROcodone-acetaminophen (NORCO) 5-325 MG tablet, Take 1 tablet by mouth every 6 (six) hours as needed for up to 7 days for moderate pain., Disp: 28 tablet, Rfl: 0 .  ondansetron (ZOFRAN) 4 MG tablet, Take 1 tablet (4 mg total) by mouth every 8 (eight) hours as needed for nausea or vomiting., Disp: 20 tablet, Rfl: 0  Past Medical Problems: Past Medical History:  Diagnosis Date  . Allergy   . Dysplasia of cervix   . Herpes genitalia   . Hypertension    HX of high readings   . Increased BMI   . Menstrual migraine     Past Surgical History: Past Surgical History:  Procedure Laterality Date  . BREAST BIOPSY Left 10/2018   removal of abscess on the skin-  . DILATION AND CURETTAGE OF UTERUS  2014 FEB and  August  . INCISION AND DRAINAGE ABSCESS Left 10/31/2018   Procedure: INCISION AND DRAINAGE LEFT BREAST ABSCESS;  Surgeon: Olean Ree, MD;  Location: [facility];  Service: General;  Laterality: Left;  . IRRIGATION AND DEBRIDEMENT ABSCESS Left 11/05/2018   Procedure: IRRIGATION AND DEBRIDEMENT ABSCESS - BREAST;  Surgeon: Olean Ree, MD;  Location: [facility];  Service: General;  Laterality: Left;  . MOUTH SURGERY    . TONSILECTOMY/ADENOIDECTOMY WITH MYRINGOTOMY Bilateral 2011    Social History: Social History   Socioeconomic History  . Marital status: Married    Spouse name: Not on file  . Number of children: Not on file  . Years of  education: Not on file  . Highest education level: Not on file  Occupational History  . Occupation: Solicitor: [employer]  . Financial resource strain: Not on file  . Food insecurity    Worry: Not on file    Inability: Not on file  . Transportation needs    Medical: Not on file    Non-medical: Not on file  Tobacco Use  . Smoking status: Never Smoker  . Smokeless tobacco: Never Used  Substance and Sexual Activity  . Alcohol use: Yes    Comment: occasioally  . Drug use: No  . Sexual activity: Yes    Birth control/protection: I.U.D.  Lifestyle  . Physical activity    Days per week: 2 days    Minutes per session: 30 min  . Stress: Not at all  Relationships  . Social Herbalist on phone: Not on file    Gets together: Not on file    Attends religious service: Not on file    Active member of club or organization: Not on file    Attends meetings of clubs or organizations: Not on file    Relationship status: Not on file  . Intimate partner violence    Fear of current or ex partner: No    Emotionally abused: No    Physically abused: No    Forced sexual activity: No  Other Topics Concern  . Not on file  Social History Narrative  . Not on file    Family History: Family History  Problem Relation Age of Onset  . Arthritis Mother   . Hypertension Mother   . Diabetes Mother   . Heart disease Father   . Hyperlipidemia Father   . Hypertension Father   . Cancer Neg Hx   . Breast cancer Neg Hx     Review of Systems: Review of Systems  Constitutional: Negative for chills, diaphoresis, fever and malaise/fatigue.  HENT: Negative.   Respiratory: Negative for cough, sputum production, shortness of breath and wheezing.   Cardiovascular: Negative for chest pain, palpitations, claudication and leg swelling.  Gastrointestinal: Negative for abdominal pain, nausea and vomiting.  Genitourinary: Negative for dysuria, frequency, hematuria and  urgency.  Musculoskeletal: Negative for myalgias.  Skin: Negative for itching and rash.  Neurological: Negative for dizziness, tingling, focal weakness, weakness and headaches.    Physical Exam: Vital Signs BP 127/82 (BP Location: Left Arm, Patient Position: Sitting, Cuff Size: Large)   Pulse 95   Temp 97.7 F (36.5 C) (Temporal)   Ht 5\' 6"  (1.676 m)   Wt 231 lb 6.4 oz (105 kg)   SpO2 92%   BMI 37.35 kg/m  Chaperone present Physical Exam Constitutional:      General: She is not in acute distress.    Appearance:  Normal appearance. She is obese. She is not ill-appearing.  HENT:     Head: Normocephalic and atraumatic.  Eyes:     Conjunctiva/sclera: Conjunctivae normal.  Neck:     Musculoskeletal: Normal range of motion and neck supple. No neck rigidity or muscular tenderness.  Cardiovascular:     Rate and Rhythm: Normal rate and regular rhythm.     Pulses: Normal pulses.     Heart sounds: Normal heart sounds. No murmur. No friction rub.  Pulmonary:     Effort: Pulmonary effort is normal. No respiratory distress.     Breath sounds: Normal breath sounds. No wheezing.  Chest:     Chest wall: No tenderness.    Abdominal:     General: Abdomen is flat. Bowel sounds are normal. There is no distension.     Palpations: Abdomen is soft.     Tenderness: There is no abdominal tenderness. There is no guarding.  Musculoskeletal: Normal range of motion.        General: No swelling, tenderness or deformity.  Lymphadenopathy:     Cervical: No cervical adenopathy.  Skin:    General: Skin is warm and dry.     Capillary Refill: Capillary refill takes less than 2 seconds.     Coloration: Skin is not jaundiced or pale.  Neurological:     General: No focal deficit present.     Mental Status: She is alert and oriented to person, place, and time. Mental status is at baseline.  Psychiatric:        Mood and Affect: Mood normal.        Behavior: Behavior normal.    Assessment/Plan: The  patient is scheduled for bilateral mastopexy with excision of left breast scar contracture correction for symmetry, scheduled for 09/10/19 with Dr. Marla Roe.  The risk that can be encountered with mastopexy were discussed and include the following but not limited to these:  Breast asymmetry, fluid accumulation, firmness of the breast, inability to breast feed, loss of nipple or areola, skin loss, decrease or no nipple sensation, fat necrosis of the breast tissue, bleeding, infection, healing delay.  There are risks of anesthesia, changes to skin sensation and injury to nerves or blood vessels.  The muscle can be temporarily or permanently injured.  You may have an allergic reaction to tape, suture, glue, blood products which can result in skin discoloration, swelling, pain, skin lesions, poor healing.  Any of these can lead to the need for revisonal surgery or stage procedures.  A mastopexy has potential to interfere with diagnostic procedures.  Nipple or breast piercing can increase risks of infection.  This procedure is best done when the breast is fully developed.  Changes in the breast will continue to occur over time.  Pregnancy can alter the outcomes of previous breast reduction surgery, weight gain and weigh loss can also effect the long term appearance.   Questions answered Prescription sent to pharmacy. She can perform > 4 mets without difficulty.  Patient knows to call with questions or concerns prior to surgery.   Electronically signed by: Carola Rhine Braxton Vantrease, PA-C 08/26/2019 11:51 AM

## 2019-08-26 NOTE — Progress Notes (Signed)
Patient ID: Laura [name], female    DOB: 1971/07/12, 48 y.o.   MRN: [id number]  Chief Complaint  Patient presents with  . Pre-op Exam    for (B) breast mastopexy w/(L) breast scar contracture excision      ICD-10-CM   1. Necrotizing soft tissue infection  M79.89   2. Postoperative breast asymmetry  N64.89     History of Present Illness: Laura [name] is a 48 y.o.  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  She presents for preoperative evaluation for upcoming procedure, bilateral mastopexy with excision of left breast scar contracture correction for symmetry, scheduled for 09/10/19 with Dr. Marla Roe.  The patient has not had problems with anesthesia, tolerated prior procedures without any issues.  She has a history of essential hypertension which is well controlled. She has never smoked. Denies any hx or fmhx of DVT/PE. No recent colds or illnesses.   She is currently a 89 DD.  Past Medical History: Allergies: Allergies  Allergen Reactions  . Bee Venom     Current Medications:  Current Outpatient Medications:  .  acetaminophen (TYLENOL) 500 MG tablet, Take 2 tablets (1,000 mg total) by mouth every 6 (six) hours as needed for mild pain or fever., Disp: 30 tablet, Rfl: 0 .  ALPRAZolam (XANAX) 0.5 MG tablet, Take 1 tablet (0.5 mg total) by mouth at bedtime as needed for anxiety., Disp: 30 tablet, Rfl: 3 .  cyanocobalamin (,VITAMIN B-12,) 1000 MCG/ML injection, INJECT 1 ML (1,000 MCG TOTAL) INTO THE MUSCLE ONCE A WEEK., Disp: 12 mL, Rfl: 5 .  desvenlafaxine (PRISTIQ) 50 MG 24 hr tablet, TAKE 1 TABLET BY MOUTH EVERY DAY, Disp: 90 tablet, Rfl: 1 .  esomeprazole (NEXIUM) 20 MG capsule, Take 20 mg by mouth daily at 12 noon., Disp: , Rfl:  .  levonorgestrel (MIRENA) 20 MCG/24HR IUD, 1 each by Intrauterine route once., Disp: , Rfl:  .  losartan-hydrochlorothiazide (HYZAAR) 50-12.5 MG  tablet, Take 1 tablet by mouth daily., Disp: 90 tablet, Rfl: 1 .  methocarbamol (ROBAXIN) 750 MG tablet, Take 1 tablet (750 mg total) by mouth 4 (four) times daily., Disp: 60 tablet, Rfl: 2 .  phentermine (ADIPEX-P) 37.5 MG tablet, TAKE 1 TABLET BY MOUTH DAILY BEFORE BREAKFAST, Disp: 30 tablet, Rfl: 2 .  Syringe/Needle, Disp, (SYRINGE 3CC/25GX1") 25G X 1" 3 ML MISC, Use for b12 injections, Disp: 50 each, Rfl: 0 .  cephALEXin (KEFLEX) 500 MG capsule, Take 1 capsule (500 mg total) by mouth 2 (two) times daily for 3 days., Disp: 6 capsule, Rfl: 0 .  HYDROcodone-acetaminophen (NORCO) 5-325 MG tablet, Take 1 tablet by mouth every 6 (six) hours as needed for up to 7 days for moderate pain., Disp: 28 tablet, Rfl: 0 .  ondansetron (ZOFRAN) 4 MG tablet, Take 1 tablet (4 mg total) by mouth every 8 (eight) hours as needed for nausea or vomiting., Disp: 20 tablet, Rfl: 0  Past Medical Problems: Past Medical History:  Diagnosis Date  . Allergy   . Dysplasia of cervix   . Herpes genitalia   . Hypertension    HX of high readings   . Increased BMI   . Menstrual migraine     Past Surgical History: Past Surgical History:  Procedure Laterality Date  . BREAST BIOPSY Left 10/2018   removal of abscess on the skin-  . DILATION AND CURETTAGE OF UTERUS  2014 FEB and  August  . INCISION AND DRAINAGE ABSCESS Left 10/31/2018   Procedure: INCISION AND DRAINAGE LEFT BREAST ABSCESS;  Surgeon: Olean Ree, MD;  Location: [facility];  Service: General;  Laterality: Left;  . IRRIGATION AND DEBRIDEMENT ABSCESS Left 11/05/2018   Procedure: IRRIGATION AND DEBRIDEMENT ABSCESS - BREAST;  Surgeon: Olean Ree, MD;  Location: [facility];  Service: General;  Laterality: Left;  . MOUTH SURGERY    . TONSILECTOMY/ADENOIDECTOMY WITH MYRINGOTOMY Bilateral 2011    Social History: Social History   Socioeconomic History  . Marital status: Married    Spouse name: Not on file  . Number of children: Not on file  . Years of  education: Not on file  . Highest education level: Not on file  Occupational History  . 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 Financial resource strain: Not on file  . Food insecurity    Worry: Not on file    Inability: Not on file  . Transportation needs    Medical: Not on file    Non-medical: Not on file  Tobacco Use  . Smoking status: Never Smoker  . Smokeless tobacco: Never Used  Substance and Sexual Activity  . Alcohol use: Yes    Comment: occasioally  . Drug use: No  . Sexual activity: Yes    Birth control/protection: I.U.D.  Lifestyle  . Physical activity    Days per week: 2 days    Minutes per session: 30 min  . Stress: Not at all  Relationships  . Social Herbalist on phone: Not on file    Gets together: Not on file    Attends religious service: Not on file    Active member of club or organization: Not on file    Attends meetings of clubs or organizations: Not on file    Relationship status: Not on file  . Intimate partner violence    Fear of current or ex partner: No    Emotionally abused: No    Physically abused: No    Forced sexual activity: No  Other Topics Concern  . Not on file  Social History Narrative  . Not on file    Family History: Family History  Problem Relation Age of Onset  . Arthritis Mother   . Hypertension Mother   . Diabetes Mother   . Heart disease Father   . Hyperlipidemia Father   . Hypertension Father   . Cancer Neg Hx   . Breast cancer Neg Hx     Review of Systems: Review of Systems  Constitutional: Negative for chills, diaphoresis, fever and malaise/fatigue.  HENT: Negative.   Respiratory: Negative for cough, sputum production, shortness of breath and wheezing.   Cardiovascular: Negative for chest pain, palpitations, claudication and leg swelling.  Gastrointestinal: Negative for abdominal pain, nausea and vomiting.  Genitourinary: Negative for dysuria, frequency, hematuria and  urgency.  Musculoskeletal: Negative for myalgias.  Skin: Negative for itching and rash.  Neurological: Negative for dizziness, tingling, focal weakness, weakness and headaches.    Physical Exam: Vital Signs BP 127/82 (BP Location: Left Arm, Patient Position: Sitting, Cuff Size: Large)   Pulse 95   Temp 97.7 F (36.5 C) (Temporal)   Ht 5\' 6"  (1.676 m)   Wt 231 lb 6.4 oz (105 kg)   SpO2 92%   BMI 37.35 kg/m  Chaperone present Physical Exam Constitutional:      General: She is not in acute distress.    Appearance:  Normal appearance. She is obese. She is not ill-appearing.  HENT:     Head: Normocephalic and atraumatic.  Eyes:     Conjunctiva/sclera: Conjunctivae normal.  Neck:     Musculoskeletal: Normal range of motion and neck supple. No neck rigidity or muscular tenderness.  Cardiovascular:     Rate and Rhythm: Normal rate and regular rhythm.     Pulses: Normal pulses.     Heart sounds: Normal heart sounds. No murmur. No friction rub.  Pulmonary:     Effort: Pulmonary effort is normal. No respiratory distress.     Breath sounds: Normal breath sounds. No wheezing.  Chest:     Chest wall: No tenderness.    Abdominal:     General: Abdomen is flat. Bowel sounds are normal. There is no distension.     Palpations: Abdomen is soft.     Tenderness: There is no abdominal tenderness. There is no guarding.  Musculoskeletal: Normal range of motion.        General: No swelling, tenderness or deformity.  Lymphadenopathy:     Cervical: No cervical adenopathy.  Skin:    General: Skin is warm and dry.     Capillary Refill: Capillary refill takes less than 2 seconds.     Coloration: Skin is not jaundiced or pale.  Neurological:     General: No focal deficit present.     Mental Status: She is alert and oriented to person, place, and time. Mental status is at baseline.  Psychiatric:        Mood and Affect: Mood normal.        Behavior: Behavior normal.    Assessment/Plan: The  patient is scheduled for bilateral mastopexy with excision of left breast scar contracture correction for symmetry, scheduled for 09/10/19 with Dr. Marla Roe.  The risk that can be encountered with mastopexy were discussed and include the following but not limited to these:  Breast asymmetry, fluid accumulation, firmness of the breast, inability to breast feed, loss of nipple or areola, skin loss, decrease or no nipple sensation, fat necrosis of the breast tissue, bleeding, infection, healing delay.  There are risks of anesthesia, changes to skin sensation and injury to nerves or blood vessels.  The muscle can be temporarily or permanently injured.  You may have an allergic reaction to tape, suture, glue, blood products which can result in skin discoloration, swelling, pain, skin lesions, poor healing.  Any of these can lead to the need for revisonal surgery or stage procedures.  A mastopexy has potential to interfere with diagnostic procedures.  Nipple or breast piercing can increase risks of infection.  This procedure is best done when the breast is fully developed.  Changes in the breast will continue to occur over time.  Pregnancy can alter the outcomes of previous breast reduction surgery, weight gain and weigh loss can also effect the long term appearance.   Questions answered Prescription sent to pharmacy. She can perform > 4 mets without difficulty.  Patient knows to call with questions or concerns prior to surgery.   Electronically signed by: Carola Rhine Robby Bulkley, PA-C 08/26/2019 11:51 AM

## 2019-09-03 ENCOUNTER — Other Ambulatory Visit: Payer: Self-pay

## 2019-09-03 ENCOUNTER — Encounter (HOSPITAL_BASED_OUTPATIENT_CLINIC_OR_DEPARTMENT_OTHER): Payer: Self-pay | Admitting: *Deleted

## 2019-09-06 ENCOUNTER — Other Ambulatory Visit (HOSPITAL_COMMUNITY): Payer: BC Managed Care – PPO

## 2019-09-08 ENCOUNTER — Encounter (HOSPITAL_BASED_OUTPATIENT_CLINIC_OR_DEPARTMENT_OTHER)
Admission: RE | Admit: 2019-09-08 | Discharge: 2019-09-08 | Disposition: A | Discharge status: Home / Self Care | Payer: BC Managed Care – PPO | Source: Ambulatory Visit | Attending: Plastic Surgery | Admitting: Plastic Surgery

## 2019-09-08 ENCOUNTER — Other Ambulatory Visit (HOSPITAL_COMMUNITY)
Admission: RE | Admit: 2019-09-08 | Discharge: 2019-09-08 | Disposition: A | Discharge status: Home / Self Care | Payer: BC Managed Care – PPO | Source: Ambulatory Visit | Attending: Plastic Surgery | Admitting: Plastic Surgery

## 2019-09-08 ENCOUNTER — Other Ambulatory Visit: Payer: Self-pay

## 2019-09-08 DIAGNOSIS — R9431 Abnormal electrocardiogram [ECG] [EKG]: Secondary | ICD-10-CM | POA: Insufficient documentation

## 2019-09-08 DIAGNOSIS — Z01818 Encounter for other preprocedural examination: Secondary | ICD-10-CM | POA: Diagnosis not present

## 2019-09-08 DIAGNOSIS — Z20828 Contact with and (suspected) exposure to other viral communicable diseases: Secondary | ICD-10-CM | POA: Diagnosis not present

## 2019-09-08 DIAGNOSIS — I10 Essential (primary) hypertension: Secondary | ICD-10-CM | POA: Diagnosis not present

## 2019-09-08 DIAGNOSIS — Z01812 Encounter for preprocedural laboratory examination: Secondary | ICD-10-CM | POA: Insufficient documentation

## 2019-09-08 LAB — BASIC METABOLIC PANEL
Anion gap: 11 (ref 5–15)
BUN: 14 mg/dL (ref 6–20)
CO2: 23 mmol/L (ref 22–32)
Calcium: 8.7 mg/dL — ABNORMAL LOW (ref 8.9–10.3)
Chloride: 104 mmol/L (ref 98–111)
Creatinine, Ser: 0.86 mg/dL (ref 0.44–1.00)
GFR calc Af Amer: >60 mL/min (ref >60)
GFR calc non Af Amer: >60 mL/min (ref >60)
Glucose, Bld: 90 mg/dL (ref 70–99)
Potassium: 4.2 mmol/L (ref 3.5–5.1)
Sodium: 138 mmol/L (ref 135–145)

## 2019-09-08 LAB — SARS CORONAVIRUS 2 (TAT 6-24 HRS): SARS Coronavirus 2: NEGATIVE

## 2019-09-08 LAB — POCT PREGNANCY, URINE: Preg Test, Ur: NEGATIVE

## 2019-09-08 NOTE — Progress Notes (Signed)
EKG reviewed by Dr. Odonno, will proceed with surgery as scheduled. 

## 2019-09-10 ENCOUNTER — Ambulatory Visit (HOSPITAL_BASED_OUTPATIENT_CLINIC_OR_DEPARTMENT_OTHER): Payer: BC Managed Care – PPO | Admitting: Anesthesiology

## 2019-09-10 ENCOUNTER — Other Ambulatory Visit: Payer: Self-pay

## 2019-09-10 ENCOUNTER — Encounter (HOSPITAL_BASED_OUTPATIENT_CLINIC_OR_DEPARTMENT_OTHER): Payer: Self-pay

## 2019-09-10 ENCOUNTER — Encounter (HOSPITAL_BASED_OUTPATIENT_CLINIC_OR_DEPARTMENT_OTHER)
Admission: RE | Disposition: A | Discharge status: Home / Self Care | Payer: Self-pay | Source: Home / Self Care | Attending: Plastic Surgery

## 2019-09-10 ENCOUNTER — Ambulatory Visit (HOSPITAL_BASED_OUTPATIENT_CLINIC_OR_DEPARTMENT_OTHER)
Admission: RE | Admit: 2019-09-10 | Discharge: 2019-09-10 | Disposition: A | Discharge status: Home / Self Care | Payer: BC Managed Care – PPO | Attending: Plastic Surgery | Admitting: Plastic Surgery

## 2019-09-10 DIAGNOSIS — N6489 Other specified disorders of breast: Secondary | ICD-10-CM

## 2019-09-10 DIAGNOSIS — Z833 Family history of diabetes mellitus: Secondary | ICD-10-CM | POA: Diagnosis not present

## 2019-09-10 DIAGNOSIS — Z91030 Bee allergy status: Secondary | ICD-10-CM | POA: Diagnosis not present

## 2019-09-10 DIAGNOSIS — L905 Scar conditions and fibrosis of skin: Secondary | ICD-10-CM | POA: Insufficient documentation

## 2019-09-10 DIAGNOSIS — Z8619 Personal history of other infectious and parasitic diseases: Secondary | ICD-10-CM

## 2019-09-10 DIAGNOSIS — Z79899 Other long term (current) drug therapy: Secondary | ICD-10-CM | POA: Diagnosis not present

## 2019-09-10 DIAGNOSIS — Z8261 Family history of arthritis: Secondary | ICD-10-CM | POA: Insufficient documentation

## 2019-09-10 DIAGNOSIS — I10 Essential (primary) hypertension: Secondary | ICD-10-CM | POA: Insufficient documentation

## 2019-09-10 DIAGNOSIS — Z8249 Family history of ischemic heart disease and other diseases of the circulatory system: Secondary | ICD-10-CM | POA: Diagnosis not present

## 2019-09-10 HISTORY — PX: SCAR REVISION: SHX5285

## 2019-09-10 HISTORY — PX: MASTOPEXY: SHX5358

## 2019-09-10 SURGERY — MASTOPEXY
Anesthesia: General | Site: Breast | Laterality: Left

## 2019-09-10 MED ORDER — PROPOFOL 10 MG/ML IV BOLUS
INTRAVENOUS | Status: AC
  Filled 2019-09-10: qty 20

## 2019-09-10 MED ORDER — LACTATED RINGERS IV SOLN: INTRAVENOUS | Status: DC

## 2019-09-10 MED ORDER — FENTANYL CITRATE (PF) 100 MCG/2ML IJ SOLN
50.0000 ug | INTRAMUSCULAR | Status: AC | PRN
  Administered 2019-09-10 (×2): 25 ug via INTRAVENOUS
  Administered 2019-09-10: 100 ug via INTRAVENOUS

## 2019-09-10 MED ORDER — EPHEDRINE 5 MG/ML INJ
INTRAVENOUS | Status: AC
  Filled 2019-09-10: qty 10

## 2019-09-10 MED ORDER — ONDANSETRON HCL 4 MG/2ML IJ SOLN
INTRAMUSCULAR | Status: AC
  Filled 2019-09-10: qty 2

## 2019-09-10 MED ORDER — EPHEDRINE SULFATE 50 MG/ML IJ SOLN
INTRAMUSCULAR | Status: DC | PRN
  Administered 2019-09-10 (×3): 5 mg via INTRAVENOUS

## 2019-09-10 MED ORDER — CHLORHEXIDINE GLUCONATE CLOTH 2 % EX PADS: 6.0000 | MEDICATED_PAD | Freq: Once | CUTANEOUS | Status: DC

## 2019-09-10 MED ORDER — ROCURONIUM BROMIDE 10 MG/ML (PF) SYRINGE
PREFILLED_SYRINGE | INTRAVENOUS | Status: AC
  Filled 2019-09-10: qty 10

## 2019-09-10 MED ORDER — ACETAMINOPHEN 650 MG RE SUPP: 650.0000 mg | RECTAL | Status: DC | PRN

## 2019-09-10 MED ORDER — DIPHENHYDRAMINE HCL 50 MG/ML IJ SOLN
INTRAMUSCULAR | Status: AC
  Filled 2019-09-10: qty 1

## 2019-09-10 MED ORDER — SUGAMMADEX SODIUM 500 MG/5ML IV SOLN
INTRAVENOUS | Status: AC
  Filled 2019-09-10: qty 5

## 2019-09-10 MED ORDER — LIDOCAINE 2% (20 MG/ML) 5 ML SYRINGE
INTRAMUSCULAR | Status: AC
  Filled 2019-09-10: qty 5

## 2019-09-10 MED ORDER — DIPHENHYDRAMINE HCL 50 MG/ML IJ SOLN
INTRAMUSCULAR | Status: DC | PRN
  Administered 2019-09-10: 12.5 mg via INTRAVENOUS

## 2019-09-10 MED ORDER — FENTANYL CITRATE (PF) 100 MCG/2ML IJ SOLN
INTRAMUSCULAR | Status: AC
  Filled 2019-09-10: qty 2

## 2019-09-10 MED ORDER — CEFAZOLIN SODIUM-DEXTROSE 2-4 GM/100ML-% IV SOLN
INTRAVENOUS | Status: AC
  Filled 2019-09-10: qty 100

## 2019-09-10 MED ORDER — DEXAMETHASONE SODIUM PHOSPHATE 10 MG/ML IJ SOLN
INTRAMUSCULAR | Status: AC
  Filled 2019-09-10: qty 1

## 2019-09-10 MED ORDER — SUCCINYLCHOLINE CHLORIDE 200 MG/10ML IV SOSY
PREFILLED_SYRINGE | INTRAVENOUS | Status: AC
  Filled 2019-09-10: qty 10

## 2019-09-10 MED ORDER — LIDOCAINE-EPINEPHRINE 1 %-1:100000 IJ SOLN
INTRAMUSCULAR | Status: DC | PRN
  Administered 2019-09-10: 35 mL

## 2019-09-10 MED ORDER — ONDANSETRON HCL 4 MG/2ML IJ SOLN
INTRAMUSCULAR | Status: DC | PRN
  Administered 2019-09-10: 4 mg via INTRAVENOUS

## 2019-09-10 MED ORDER — DEXAMETHASONE SODIUM PHOSPHATE 4 MG/ML IJ SOLN
INTRAMUSCULAR | Status: DC | PRN
  Administered 2019-09-10: 10 mg via INTRAVENOUS

## 2019-09-10 MED ORDER — LIDOCAINE HCL (CARDIAC) PF 100 MG/5ML IV SOSY
PREFILLED_SYRINGE | INTRAVENOUS | Status: DC | PRN
  Administered 2019-09-10: 50 mg via INTRAVENOUS

## 2019-09-10 MED ORDER — PHENYLEPHRINE 40 MCG/ML (10ML) SYRINGE FOR IV PUSH (FOR BLOOD PRESSURE SUPPORT)
PREFILLED_SYRINGE | INTRAVENOUS | Status: AC
  Filled 2019-09-10: qty 10

## 2019-09-10 MED ORDER — LACTATED RINGERS IV SOLN
INTRAVENOUS | Status: DC
  Administered 2019-09-10 (×2): via INTRAVENOUS

## 2019-09-10 MED ORDER — PROPOFOL 10 MG/ML IV BOLUS
INTRAVENOUS | Status: DC | PRN
  Administered 2019-09-10: 150 mg via INTRAVENOUS
  Administered 2019-09-10: 50 mg via INTRAVENOUS

## 2019-09-10 MED ORDER — CEFAZOLIN SODIUM-DEXTROSE 2-4 GM/100ML-% IV SOLN
2.0000 g | INTRAVENOUS | Status: AC
  Administered 2019-09-10: 2 g via INTRAVENOUS

## 2019-09-10 MED ORDER — SODIUM CHLORIDE 0.9 % IV SOLN: 250.0000 mL | INTRAVENOUS | Status: DC | PRN

## 2019-09-10 MED ORDER — SODIUM CHLORIDE 0.9% FLUSH: 3.0000 mL | INTRAVENOUS | Status: DC | PRN

## 2019-09-10 MED ORDER — ACETAMINOPHEN 325 MG PO TABS: 650.0000 mg | ORAL_TABLET | ORAL | Status: DC | PRN

## 2019-09-10 MED ORDER — MIDAZOLAM HCL 2 MG/2ML IJ SOLN
INTRAMUSCULAR | Status: AC
  Filled 2019-09-10: qty 2

## 2019-09-10 MED ORDER — MORPHINE SULFATE (PF) 4 MG/ML IV SOLN: 2.0000 mg | INTRAVENOUS | Status: DC | PRN

## 2019-09-10 MED ORDER — MIDAZOLAM HCL 2 MG/2ML IJ SOLN
1.0000 mg | INTRAMUSCULAR | Status: DC | PRN
  Administered 2019-09-10: 12:00:00 2 mg via INTRAVENOUS

## 2019-09-10 MED ORDER — MEPERIDINE HCL 25 MG/ML IJ SOLN: 6.2500 mg | INTRAMUSCULAR | Status: DC | PRN

## 2019-09-10 MED ORDER — OXYCODONE HCL 5 MG PO TABS: 5.0000 mg | ORAL_TABLET | ORAL | Status: DC | PRN

## 2019-09-10 MED ORDER — METOCLOPRAMIDE HCL 5 MG/ML IJ SOLN: 10.0000 mg | Freq: Once | INTRAMUSCULAR | Status: DC | PRN

## 2019-09-10 MED ORDER — FENTANYL CITRATE (PF) 100 MCG/2ML IJ SOLN
25.0000 ug | INTRAMUSCULAR | Status: DC | PRN
  Administered 2019-09-10: 15:00:00 25 ug via INTRAVENOUS

## 2019-09-10 MED ORDER — SODIUM CHLORIDE 0.9% FLUSH: 3.0000 mL | Freq: Two times a day (BID) | INTRAVENOUS | Status: DC

## 2019-09-10 SURGICAL SUPPLY — 101 items
BAG DECANTER FOR FLEXI CONT (MISCELLANEOUS) ×2 IMPLANT
BINDER BREAST LRG (GAUZE/BANDAGES/DRESSINGS) IMPLANT
BINDER BREAST MEDIUM (GAUZE/BANDAGES/DRESSINGS) IMPLANT
BINDER BREAST XLRG (GAUZE/BANDAGES/DRESSINGS) IMPLANT
BINDER BREAST XXLRG (GAUZE/BANDAGES/DRESSINGS) ×2 IMPLANT
BIOPATCH RED 1 DISK 7.0 (GAUZE/BANDAGES/DRESSINGS) IMPLANT
BIOPATCH RED 1IN DISK 7.0MM (GAUZE/BANDAGES/DRESSINGS)
BLADE CLIPPER SURG (BLADE) IMPLANT
BLADE HEX COATED 2.75 (ELECTRODE) ×4 IMPLANT
BLADE SURG 10 STRL SS (BLADE) ×6 IMPLANT
BLADE SURG 15 STRL LF DISP TIS (BLADE) ×2 IMPLANT
BLADE SURG 15 STRL SS (BLADE) ×2
BNDG CONFORM 2 STRL LF (GAUZE/BANDAGES/DRESSINGS) IMPLANT
BNDG ELASTIC 2X5.8 VLCR STR LF (GAUZE/BANDAGES/DRESSINGS) IMPLANT
BNDG GAUZE ELAST 4 BULKY (GAUZE/BANDAGES/DRESSINGS) IMPLANT
CANISTER SUCT 1200ML W/VALVE (MISCELLANEOUS) ×4 IMPLANT
CHLORAPREP W/TINT 26 (MISCELLANEOUS) ×6 IMPLANT
CLOSURE STERI-STRIP 1/2X4 (GAUZE/BANDAGES/DRESSINGS)
CLOSURE WOUND 1/2 X4 (GAUZE/BANDAGES/DRESSINGS) ×1
CLSR STERI-STRIP ANTIMIC 1/2X4 (GAUZE/BANDAGES/DRESSINGS) IMPLANT
CORD BIPOLAR FORCEPS 12FT (ELECTRODE) IMPLANT
COVER BACK TABLE REUSABLE LG (DRAPES) ×4 IMPLANT
COVER MAYO STAND REUSABLE (DRAPES) ×4 IMPLANT
COVER WAND RF STERILE (DRAPES) IMPLANT
DECANTER SPIKE VIAL GLASS SM (MISCELLANEOUS) IMPLANT
DERMABOND ADVANCED (GAUZE/BANDAGES/DRESSINGS) ×4
DERMABOND ADVANCED .7 DNX12 (GAUZE/BANDAGES/DRESSINGS) IMPLANT
DRAIN CHANNEL 19F RND (DRAIN) IMPLANT
DRAPE HALF SHEET 70X43 (DRAPES) IMPLANT
DRAPE LAPAROSCOPIC ABDOMINAL (DRAPES) ×4 IMPLANT
DRAPE LAPAROTOMY 100X72 PEDS (DRAPES) IMPLANT
DRAPE U-SHAPE 76X120 STRL (DRAPES) IMPLANT
DRSG PAD ABDOMINAL 8X10 ST (GAUZE/BANDAGES/DRESSINGS) ×8 IMPLANT
DRSG TEGADERM 2-3/8X2-3/4 SM (GAUZE/BANDAGES/DRESSINGS) IMPLANT
ELECT BLADE 4.0 EZ CLEAN MEGAD (MISCELLANEOUS)
ELECT COATED BLADE 2.86 ST (ELECTRODE) IMPLANT
ELECT NDL BLADE 2-5/6 (NEEDLE) ×2 IMPLANT
ELECT NEEDLE BLADE 2-5/6 (NEEDLE) IMPLANT
ELECT REM PT RETURN 9FT ADLT (ELECTROSURGICAL) ×4
ELECT REM PT RETURN 9FT PED (ELECTROSURGICAL)
ELECTRODE BLDE 4.0 EZ CLN MEGD (MISCELLANEOUS) IMPLANT
ELECTRODE REM PT RETRN 9FT PED (ELECTROSURGICAL) IMPLANT
ELECTRODE REM PT RTRN 9FT ADLT (ELECTROSURGICAL) ×2 IMPLANT
EVACUATOR SILICONE 100CC (DRAIN) IMPLANT
GAUZE SPONGE 4X4 12PLY STRL (GAUZE/BANDAGES/DRESSINGS) IMPLANT
GAUZE SPONGE 4X4 12PLY STRL LF (GAUZE/BANDAGES/DRESSINGS) IMPLANT
GAUZE XEROFORM 1X8 LF (GAUZE/BANDAGES/DRESSINGS) IMPLANT
GLOVE BIO SURGEON STRL SZ 6.5 (GLOVE) ×4 IMPLANT
GLOVE BIO SURGEON STRL SZ7 (GLOVE) ×4 IMPLANT
GLOVE BIO SURGEONS STRL SZ 6.5 (GLOVE) ×2
GOWN STRL REUS W/ TWL LRG LVL3 (GOWN DISPOSABLE) ×4 IMPLANT
GOWN STRL REUS W/ TWL XL LVL3 (GOWN DISPOSABLE) IMPLANT
GOWN STRL REUS W/TWL LRG LVL3 (GOWN DISPOSABLE) ×6
GOWN STRL REUS W/TWL XL LVL3 (GOWN DISPOSABLE) ×2
NDL HYPO 25X1 1.5 SAFETY (NEEDLE) ×2 IMPLANT
NDL HYPO 30GX1 BEV (NEEDLE) IMPLANT
NDL PRECISIONGLIDE 27X1.5 (NEEDLE) IMPLANT
NEEDLE HYPO 25X1 1.5 SAFETY (NEEDLE) ×4 IMPLANT
NEEDLE HYPO 30GX1 BEV (NEEDLE) IMPLANT
NEEDLE PRECISIONGLIDE 27X1.5 (NEEDLE) IMPLANT
NS IRRIG 1000ML POUR BTL (IV SOLUTION) ×4 IMPLANT
PACK BASIN DAY SURGERY FS (CUSTOM PROCEDURE TRAY) ×4 IMPLANT
PENCIL BUTTON HOLSTER BLD 10FT (ELECTRODE) ×4 IMPLANT
PIN SAFETY STERILE (MISCELLANEOUS) IMPLANT
SLEEVE SCD COMPRESS KNEE MED (MISCELLANEOUS) ×4 IMPLANT
SPONGE GAUZE 2X2 8PLY STER LF (GAUZE/BANDAGES/DRESSINGS)
SPONGE GAUZE 2X2 8PLY STRL LF (GAUZE/BANDAGES/DRESSINGS) IMPLANT
SPONGE LAP 18X18 RF (DISPOSABLE) ×10 IMPLANT
STRIP CLOSURE SKIN 1/2X4 (GAUZE/BANDAGES/DRESSINGS) ×3 IMPLANT
STRIP SUTURE WOUND CLOSURE 1/2 (SUTURE) ×6 IMPLANT
SUCTION FRAZIER HANDLE 10FR (MISCELLANEOUS)
SUCTION TUBE FRAZIER 10FR DISP (MISCELLANEOUS) IMPLANT
SUT CHROMIC 4 0 P 3 18 (SUTURE) IMPLANT
SUT ETHILON 4 0 PS 2 18 (SUTURE) IMPLANT
SUT ETHILON 5 0 P 3 18 (SUTURE)
SUT MNCRL 6-0 UNDY P1 1X18 (SUTURE) IMPLANT
SUT MNCRL AB 4-0 PS2 18 (SUTURE) ×12 IMPLANT
SUT MON AB 3-0 SH 27 (SUTURE) ×8
SUT MON AB 3-0 SH27 (SUTURE) IMPLANT
SUT MON AB 5-0 P3 18 (SUTURE) IMPLANT
SUT MON AB 5-0 PS2 18 (SUTURE) ×10 IMPLANT
SUT MONOCRYL 6-0 P1 1X18 (SUTURE)
SUT NYLON ETHILON 5-0 P-3 1X18 (SUTURE) IMPLANT
SUT PLAIN 5 0 P 3 18 (SUTURE) IMPLANT
SUT SILK 3 0 PS 1 (SUTURE) IMPLANT
SUT VIC AB 3-0 SH 27 (SUTURE)
SUT VIC AB 3-0 SH 27X BRD (SUTURE) IMPLANT
SUT VIC AB 5-0 P-3 18X BRD (SUTURE) IMPLANT
SUT VIC AB 5-0 P3 18 (SUTURE)
SUT VICRYL 4-0 PS2 18IN ABS (SUTURE) IMPLANT
SUT VICRYL 6 0 P 1 18 (SUTURE) IMPLANT
SYR BULB 3OZ (MISCELLANEOUS) IMPLANT
SYR BULB IRRIGATION 50ML (SYRINGE) ×4 IMPLANT
SYR CONTROL 10ML LL (SYRINGE) ×4 IMPLANT
TAPE MEASURE VINYL STERILE (MISCELLANEOUS) ×4 IMPLANT
TOWEL GREEN STERILE FF (TOWEL DISPOSABLE) ×8 IMPLANT
TRAY DSU PREP LF (CUSTOM PROCEDURE TRAY) IMPLANT
TUBE CONNECTING 20'X1/4 (TUBING) ×1
TUBE CONNECTING 20X1/4 (TUBING) ×3 IMPLANT
UNDERPAD 30X36 HEAVY ABSORB (UNDERPADS AND DIAPERS) ×8 IMPLANT
YANKAUER SUCT BULB TIP NO VENT (SUCTIONS) ×4 IMPLANT

## 2019-09-10 NOTE — Anesthesia Preprocedure Evaluation (Signed)
Anesthesia Evaluation  Patient identified by MRN, date of birth, ID band Patient awake    Reviewed: Allergy & Precautions, NPO status , Patient's Chart, lab work & pertinent test results  Airway Mallampati: II  TM Distance: >3 FB Neck ROM: Full    Dental no notable dental hx.    Pulmonary neg pulmonary ROS,    Pulmonary exam normal breath sounds clear to auscultation       Cardiovascular hypertension, Pt. on medications negative cardio ROS Normal cardiovascular exam Rhythm:Regular Rate:Normal     Neuro/Psych negative neurological ROS  negative psych ROS   GI/Hepatic negative GI ROS, Neg liver ROS,   Endo/Other  negative endocrine ROS  Renal/GU negative Renal ROS  negative genitourinary   Musculoskeletal negative musculoskeletal ROS (+)   Abdominal   Peds negative pediatric ROS (+)  Hematology negative hematology ROS (+)   Anesthesia Other Findings   Reproductive/Obstetrics negative OB ROS                             Anesthesia Physical Anesthesia Plan  ASA: II  Anesthesia Plan: General   Post-op Pain Management:    Induction: Intravenous  PONV Risk Score and Plan: 3 and Ondansetron, Dexamethasone, Midazolam and Treatment may vary due to age or medical condition  Airway Management Planned: LMA  Additional Equipment:   Intra-op Plan:   Post-operative Plan: Extubation in OR  Informed Consent: I have reviewed the patients History and Physical, chart, labs and discussed the procedure including the risks, benefits and alternatives for the proposed anesthesia with the patient or authorized representative who has indicated his/her understanding and acceptance.     Dental advisory given  Plan Discussed with: CRNA  Anesthesia Plan Comments:         Anesthesia Quick Evaluation

## 2019-09-10 NOTE — Interval H&P Note (Signed)
History and Physical Interval Note:  09/10/2019 11:42 AM  Laura [name]  has presented today for surgery, with the diagnosis of Open Wound Of Left Breast; Necrotizing soft tissue infection; Postoperative breast asymmetry.  The various methods of treatment have been discussed with the patient and family. After consideration of risks, benefits and other options for treatment, the patient has consented to  Procedure(s) with comments: MASTOPEXY (Bilateral) left breast scar contracture excision (Left) - 2.5 hours for case, please as a surgical intervention.  The patient's history has been reviewed, patient examined, no change in status, stable for surgery.  I have reviewed the patient's chart and labs.  Questions were answered to the patient's satisfaction.     [name]

## 2019-09-10 NOTE — Anesthesia Postprocedure Evaluation (Signed)
Anesthesia Post Note  Patient: Laura [name]  Procedure(s) Performed: MASTOPEXY (Bilateral Breast) left breast scar contracture excision (Left Breast)     Patient location during evaluation: PACU Anesthesia Type: General Level of consciousness: awake and alert Pain management: pain level controlled Vital Signs Assessment: post-procedure vital signs reviewed and stable Respiratory status: spontaneous breathing, nonlabored ventilation, respiratory function stable and patient connected to nasal cannula oxygen Cardiovascular status: blood pressure returned to baseline and stable Postop Assessment: no apparent nausea or vomiting Anesthetic complications: no    Last Vitals:  Vitals:   09/10/19 1500 09/10/19 1515  BP: 140/74 140/76  Pulse: 98 84  Resp: 14 14  Temp:    SpO2: 98% 91%    Last Pain:  Vitals:   09/10/19 1515  TempSrc:   PainSc: 3                  Montez Hageman

## 2019-09-10 NOTE — Anesthesia Procedure Notes (Signed)
Procedure Name: LMA Insertion Date/Time: 09/10/2019 12:26 PM Performed by: Willa Frater, CRNA Pre-anesthesia Checklist: Patient identified, Emergency Drugs available, Suction available and Patient being monitored Patient Re-evaluated:Patient Re-evaluated prior to induction Oxygen Delivery Method: Circle system utilized Preoxygenation: Pre-oxygenation with 100% oxygen Induction Type: IV induction Ventilation: Mask ventilation without difficulty LMA: LMA inserted LMA Size: 4.0 Number of attempts: 1 Airway Equipment and Method: Bite block Placement Confirmation: positive ETCO2 Tube secured with: Tape Dental Injury: Teeth and Oropharynx as per pre-operative assessment

## 2019-09-10 NOTE — Discharge Instructions (Signed)
INSTRUCTIONS FOR AFTER BREAST SURGERY   You are getting ready to undergo breast surgery.  You will likely have some questions about what to expect following your operation.  The following information will help you and your family understand what to expect when you are discharged from the hospital.  Following these guidelines will help ensure a smooth recovery and reduce risks of complications.   Postoperative instructions include information on: diet, wound care, medications and physical activity.  AFTER SURGERY Expect to go home after the procedure.  In some cases, you may need to spend one night in the hospital for observation.  DIET Breast surgery does not require a specific diet.  However, I have to mention that the healthier you eat the better your body can start healing. It is important to increasing your protein intake.  This means limiting the foods with sugar and carbohydrates.  Focus on vegetables and some meat.  If you have any liposuction during your procedure be sure to drink water.  If your urine is bright yellow, then it is concentrated, and you need to drink more water.  As a general rule after surgery, you should have 8 ounces of water every hour while awake.  If you find you are persistently nauseated or unable to take in liquids let us know.  NO TOBACCO USE or EXPOSURE.  This will slow your healing process and increase the risk of a wound.  WOUND CARE If you don't have a drain:  You can shower the day after surgery. Use fragrance free soap.  Dial, [place] and [place] are usually mild on the skin.    If you have steri-strips / tape directly attached to your skin leave them in place. It is OK to get these wet.  No baths, pools or hot tubs for two weeks. We close your incision to leave the smallest and best-looking scar. No ointment or creams on your incisions until given the go ahead.  Especially not Neosporin (Too many skin reactions with this one).  A few weeks after surgery you can use  Mederma and start massaging the scar. We ask you to wear your binder or sports bra for the first 6 weeks around the clock, including while sleeping. This provides added comfort and helps reduce the fluid accumulation at the surgery site.  ACTIVITY No heavy lifting until cleared by the doctor.  This usually means no more than a half-gallon of milk.  It is OK to walk and climb stairs. In fact, moving your legs is very important to decrease your risk of a blood clot.  It will also help keep you from getting deconditioned.  Every 1 to 2 hours get up and walk for 5 minutes. This will help with a quicker recovery back to normal.  Let pain be your guide so you don't do too much.  NO, you cannot do the spring cleaning and don't plan on taking care of anyone else.  This is your time for TLC.  You will be more comfortable if you sleep and rest with your head elevated either with a few pillows under you or in a recliner.  No stomach sleeping for a few months.  WORK Everyone returns to work at different times. As a rough guide, most people take at least 1 - 2 weeks off prior to returning to work. If you need documentation for your job, bring the forms to your postoperative follow up visit.  DRIVING Arrange for someone to bring you home from  the hospital.  You may be able to drive a few days after surgery but not while taking any narcotics or valium.  BOWEL MOVEMENTS Constipation can occur after anesthesia and while taking pain medication.  It is important to stay ahead for your comfort.  We recommend taking Milk of Magnesia (2 tablespoons; twice a day) while taking the pain pills.  SEROMA This is fluid your body tried to put in the surgical site.  This is normal but if it creates tight skinny skin let us know.  It usually decreases in a few weeks.  WHEN TO CALL Call your surgeon's office if any of the following occur:  Fever 101 degrees F or greater  Excessive bleeding or fluid from the incision  site.  Pain that increases over time without aid from the medications  Redness, warmth, or pus draining from incision sites  Persistent nausea or inability to take in liquids  Severe misshapen area that underwent the operation.    Post Anesthesia Home Care Instructions  Activity: Get plenty of rest for the remainder of the day. A responsible individual must stay with you for 24 hours following the procedure.  For the next 24 hours, DO NOT: -Drive a car -Paediatric nurse -Drink alcoholic beverages -Take any medication unless instructed by your physician -Make any legal decisions or sign important papers.  Meals: Start with liquid foods such as gelatin or soup. Progress to regular foods as tolerated. Avoid greasy, spicy, heavy foods. If nausea and/or vomiting occur, drink only clear liquids until the nausea and/or vomiting subsides. Call your physician if vomiting continues.  Special Instructions/Symptoms: Your throat may feel dry or sore from the anesthesia or the breathing tube placed in your throat during surgery. If this causes discomfort, gargle with warm salt water. The discomfort should disappear within 24 hours.  If you had a scopolamine patch placed behind your ear for the management of post- operative nausea and/or vomiting:  1. The medication in the patch is effective for 72 hours, after which it should be removed.  Wrap patch in a tissue and discard in the trash. Wash hands thoroughly with soap and water. 2. You may remove the patch earlier than 72 hours if you experience unpleasant side effects which may include dry mouth, dizziness or visual disturbances. 3. Avoid touching the patch. Wash your hands with soap and water after contact with the patch.

## 2019-09-10 NOTE — Transfer of Care (Signed)
Immediate Anesthesia Transfer of Care Note  Patient: Laura [name]  Procedure(s) Performed: MASTOPEXY (Bilateral Breast) left breast scar contracture excision (Left Breast)  Patient Location: PACU  Anesthesia Type:General  Level of Consciousness: drowsy  Airway & Oxygen Therapy: Patient Spontanous Breathing and Patient connected to face mask oxygen  Post-op Assessment: Report given to RN and Post -op Vital signs reviewed and stable  Post vital signs: Reviewed and stable  Last Vitals:  Vitals Value Taken Time  BP 132/75 09/10/19 1431  Temp    Pulse 101 09/10/19 1433  Resp 14 09/10/19 1433  SpO2 99 % 09/10/19 1433  Vitals shown include unvalidated device data.  Last Pain:  Vitals:   09/10/19 1120  TempSrc: Oral  PainSc: 0-No pain         Complications: No apparent anesthesia complications

## 2019-09-10 NOTE — Op Note (Signed)
Op note:    DATE OF PROCEDURE: 09/10/2019  LOCATION: [place]  SURGEON: Lyndee Leo Sanger Shikita Vaillancourt, DO  ASSISTANT: Roetta Sessions, PA  PREOPERATIVE DIAGNOSIS 1. 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 Bilateral breast mastopexy.  Right 189g, Left 197g 2. Release of left breast scar contracture with excision of 2 x 3 cm  COMPLICATIONS: None.  INDICATIONS FOR PROCEDURE Laura [name] is a 48 y.o. year-old female born on 09/14/1971,with a history of a left breast infection and scar contracture which resulted in breast asymmetry. MRN: [id number]  CONSENT Informed consent was obtained directly from the patient. The risks, benefits and alternatives were fully discussed. Specific risks including but not limited to bleeding, infection, hematoma, seroma, scarring, pain, nipple necrosis, asymmetry, poor cosmetic results, and need for further surgery were discussed. The patient had ample opportunity to have her questions answered to her satisfaction.  DESCRIPTION OF PROCEDURE  Patient was brought into the operating room and placed in a supine position.  SCDs were placed and appropriate padding was performed.  Antibiotics were given. The patient underwent general anesthesia and the chest was prepped and draped in a sterile fashion.  A timeout was performed and all information was confirmed to be correct.  Right side: Preoperative markings were confirmed.  Incision lines were injected with 1% Xylocaine with epinephrine.  After waiting for vasoconstriction, the marked lines were incised.  A Wise-pattern superomedial breast pattern was performed by de-epithelializing the pedicle, using bovie to create the superomedial pedicle, and removing breast tissue from the superior and inferior portions of the breast.  Care was taken to not undermine the breast pedicle. The nipple was gently rotated into  position and the soft tissue closed with 4-0 Monocryl.   Hemostasis was confirmed.  The deep tissues were approximated with 3-0 Monocryl sutures and the skin was closed with deep dermal and subcuticular 4-0 Monocryl sutures.  The nipple and skin flaps had good capillary refill at the end of the procedure.    Left side: Preoperative markings were confirmed.  Incision lines were injected with 1% Xylocaine with epinephrine.  After waiting for vasoconstriction, the marked lines were incised.  The lateral aspect of the breast with the scar and contracture was excised for an area of 2 x 3 cm. A Wise-pattern superomedial breast pattern was performed by de-epithelializing the pedicle, using bovie to create the superomedial pedicle, and removing breast tissue from the superior and inferior portions of the breast.  Care was taken to not undermine the breast pedicle. Hemostasis was achieved.  The lateral deep area of the breast was released as well to improve the lateral flap movement and closure. The nipple was gently rotated into position and the soft tissue was closed with 4-0 Monocryl.  The patient was sat upright and size and shape symmetry was confirmed.  The deep tissues were approximated with 3-0 monocryl sutures and the skin was closed with deep dermal and subcuticular 4-0 Monocryl sutures.  Dermabond was applied.  A breast binder and ABDs were placed.  The nipple and skin flaps had good capillary refill at the end of the procedure.  The patient tolerated the procedure well. The patient was allowed to wake from anesthesia and taken to the recovery room in satisfactory condition  The advanced practice practitioner (APP) assisted throughout the case.  The APP was essential in retraction and counter traction when needed to make the case progress smoothly.  This retraction and assistance made it possible to see the tissue plans for the procedure.  The assistance was needed for blood control, tissue re-approximation  and assisted with closure of the incision site.

## 2019-09-11 ENCOUNTER — Encounter (HOSPITAL_BASED_OUTPATIENT_CLINIC_OR_DEPARTMENT_OTHER): Payer: Self-pay | Admitting: Plastic Surgery

## 2019-09-12 LAB — SURGICAL PATHOLOGY

## 2019-09-18 ENCOUNTER — Telehealth: Payer: Self-pay

## 2019-09-18 NOTE — Progress Notes (Signed)
Subjective:     Patient ID: Laura [name], female    DOB: 18-Apr-1971, 48 y.o.   MRN: [id number]  Chief Complaint  Patient presents with  . Post-op Follow-up    (B) breast mastopexy w/ left breast scar    HPI: The patient is a 48 y.o. female here for follow-up after bilateral mastopexy and release of left breast scar contracture on 09/10/19 with Dr. Marla Roe.  Post-operatively, she developed a right breast hematoma that developed the night of surgery. It was drained in the office. Since then, she reports she has been doing a lot better. She continues to have drainage from the R breast that is serosanguinous in nature. It is decreasing in volume and occurs intermittently. She has been applying gauze to the area.   On exam today, L breast is slightly larger than R. Steri-strips in place bilaterally minus a few on the right side where she has a small wound from hematoma evacuation procedure.  Denies any dizziness, weakness, fever, chills, n/v. 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   Review of Systems  Constitutional: Positive for activity change. Negative for appetite change, chills, diaphoresis, fatigue and fever.  Respiratory: Negative for cough and shortness of breath.   Cardiovascular: Negative for chest pain and leg swelling.  Gastrointestinal: Negative for abdominal pain, nausea and vomiting.  Musculoskeletal: Negative.   Skin: Positive for wound (bilateral breast). Negative for color change, pallor and rash.  Neurological: Negative for dizziness, weakness, light-headedness and headaches.     Objective:   Vital Signs BP (!) 147/77 (BP Location: Left Arm, Patient Position: Sitting, Cuff Size: Large)   Pulse 80   Temp (!) 97.5 F (36.4 C) (Temporal)   Ht 5\' 6"  (1.676 m)   Wt 234 lb (106.1 kg)   SpO2 97%   BMI 37.77 kg/m  Vital Signs and Nursing Note Reviewed  Physical Exam  Constitutional: She is oriented  to person, place, and time and well-developed, well-nourished, and in no distress.  HENT:  Head: Normocephalic and atraumatic.  Eyes: Pupils are equal, round, and reactive to light.  Neck: Normal range of motion.  Cardiovascular: Normal rate.  Pulmonary/Chest: Effort normal. No respiratory distress. Right breast exhibits tenderness. Right breast exhibits no inverted nipple and no nipple discharge. Left breast exhibits tenderness. Left breast exhibits no inverted nipple and no nipple discharge. Breasts are asymmetrical.    Left breast swelling noted. Bilateral inferior vertical limb wounds noted. Serosanguinous drainage noted from L and R breast at wound site.  No erythema, purulence noted. No dehiscence noted other than specified areas above (inferior vertical limb)  Abdominal: Soft.  Musculoskeletal: Normal range of motion.        General: No tenderness or edema.  Neurological: She is alert and oriented to person, place, and time. Gait normal.  Skin: Skin is warm and dry. No rash noted. She is not diaphoretic. No erythema. No pallor.  + breast ecchymosis (r)   Psychiatric: Mood and affect normal.    Assessment/Plan:     ICD-10-CM   1. Postoperative breast asymmetry  N64.89   2. Necrotizing soft tissue infection  M79.89   3. Open wound of left breast, subsequent encounter  S21.002D    Overall, Laura [name] is doing well. She has some bilateral breast swelling, L > R. She continues to have some drainage from the right breast, occurring intermittently. No steady flow. No sign of active bleeding.  She is asymptomatic.  ~ 75 - 100 cc of serosanguinous fluid drained from L breast today after removal of L breast inferior steri-strips for inspection. Fluid was non purulent, non infected. No sign of active bleeding.  New ABD pads applied and 4x4 gauze. Continue to allow to drain. It may continue for another 1-2 weeks.   No fever, chills, n/v. Feeling well.  Follow up in 2 weeks for check up.  She can return to work in 1 week. If she would like to return sooner, that is okay with me as long as she is on light duty and not lifting anything heavier than 15 lb.  Patient knows to call with any questions or concerns.   Carola Rhine Gabbi Whetstone, PA-C 09/19/2019, 12:54 PM

## 2019-09-18 NOTE — Telephone Encounter (Signed)

## 2019-09-19 ENCOUNTER — Encounter: Payer: Self-pay | Admitting: Surgical

## 2019-09-19 ENCOUNTER — Encounter: Payer: Self-pay | Admitting: Plastic Surgery

## 2019-09-19 ENCOUNTER — Other Ambulatory Visit: Payer: Self-pay

## 2019-09-19 ENCOUNTER — Ambulatory Visit (INDEPENDENT_AMBULATORY_CARE_PROVIDER_SITE_OTHER): Payer: BC Managed Care – PPO | Admitting: Surgical

## 2019-09-19 VITALS — BP 147/77 | HR 80 | Temp 97.5°F | Ht 66.0 in | Wt 234.0 lb

## 2019-09-19 DIAGNOSIS — M7989 Other specified soft tissue disorders: Secondary | ICD-10-CM

## 2019-09-19 DIAGNOSIS — S21002D Unspecified open wound of left breast, subsequent encounter: Secondary | ICD-10-CM

## 2019-09-19 DIAGNOSIS — N6489 Other specified disorders of breast: Secondary | ICD-10-CM

## 2019-09-19 NOTE — Brief Op Note (Signed)
09/10/2019  1:01 PM  PATIENT:  Laura [name]  48 y.o. female  PRE-OPERATIVE DIAGNOSIS: Right breast hematoma  POST-OPERATIVE DIAGNOSIS: Drainage of right breast hematoma  PROCEDURE:  Procedure(s) with comments: MASTOPEXY (Bilateral) left breast scar contracture excision (Left) - 2.5 hours for case, please  SURGEON:  Surgeon(s) and Role:    * Dillingham, Claire S, DO - Primary  ANESTHESIA:   local  EBL:  50 mL   BLOOD ADMINISTERED:none  DRAINS: none   LOCAL MEDICATIONS USED:  LIDOCAINE   SPECIMEN:  No Specimen  DISPOSITION OF SPECIMEN:  N/A  COUNTS:  YES  TOURNIQUET:  * No tourniquets in log *  DICTATION: The patient underwent bilateral breast surgery.  She called with the right breast much larger than the left, tight and swollen.  Local was placed at the lower incision site at the vertical limb.  The hematoma was evacuated.  4 and 5-0 Monocryl was used to close the incision.  Patient tolerated procedure well.  There were no complications.  PLAN OF CARE: Continue sports bra  PATIENT DISPOSITION:  Returned home with husband

## 2019-09-25 ENCOUNTER — Ambulatory Visit: Payer: BC Managed Care – PPO | Admitting: Surgical

## 2019-09-25 ENCOUNTER — Ambulatory Visit (INDEPENDENT_AMBULATORY_CARE_PROVIDER_SITE_OTHER): Payer: BC Managed Care – PPO | Admitting: Plastic Surgery

## 2019-09-25 ENCOUNTER — Other Ambulatory Visit: Payer: Self-pay

## 2019-09-25 ENCOUNTER — Encounter: Payer: Self-pay | Admitting: Plastic Surgery

## 2019-09-25 VITALS — BP 125/84 | HR 92 | Temp 97.7°F | Ht 66.0 in | Wt 234.6 lb

## 2019-09-25 DIAGNOSIS — N6489 Other specified disorders of breast: Secondary | ICD-10-CM

## 2019-09-25 MED ORDER — CEPHALEXIN 500 MG PO CAPS: 500.0000 mg | ORAL_CAPSULE | Freq: Three times a day (TID) | ORAL | 0 refills | Status: DC

## 2019-09-25 NOTE — Progress Notes (Signed)
Patient presents about 2 weeks after bilateral mastopexy/reduction with concerns of pain and drainage on the right side.  She did have a hematoma on that side that required evacuation the night of surgery.  She has noticed persistent drainage from both sides but the right side is more painful and still a bit more swollen than the left.  She has started self treating herself with Keflex and has taken that for the last few days.  She has not had any fevers.  On exam she is a bit more swollen on the right than the left and has some mild resolving bruising on that side.  There is some erythema and tenderness along the vertical limb inferiorly and there is Steri-Strips crossing this.  There is a small opening near the T-junction that is draining serous fluid.  The right side might be slightly warmer than the left but not bad.  Overall both nipples are viable and the surrounding incisions are intact otherwise.  It seems she has a resolving seroma on the right and I agree with antibiotic coverage in the meantime.  I am going to call her in an extension on the Keflex for a total of 7 days.  In the meantime she will use gauze to control the drainage and I suspect she will gradually feel better with time.  If she notices any worsening then she knows to call the office and come in to be evaluated.

## 2019-09-30 ENCOUNTER — Encounter: Payer: Self-pay | Admitting: Plastic Surgery

## 2019-09-30 ENCOUNTER — Other Ambulatory Visit: Payer: Self-pay

## 2019-09-30 ENCOUNTER — Ambulatory Visit (INDEPENDENT_AMBULATORY_CARE_PROVIDER_SITE_OTHER): Payer: BC Managed Care – PPO | Admitting: Plastic Surgery

## 2019-09-30 ENCOUNTER — Other Ambulatory Visit (HOSPITAL_COMMUNITY)
Admission: RE | Admit: 2019-09-30 | Discharge: 2019-09-30 | Disposition: A | Discharge status: Home / Self Care | Payer: BC Managed Care – PPO | Source: Ambulatory Visit | Attending: Plastic Surgery | Admitting: Plastic Surgery

## 2019-09-30 VITALS — BP 147/83 | HR 90 | Temp 97.8°F | Ht 66.0 in | Wt 234.0 lb

## 2019-09-30 DIAGNOSIS — S21002D Unspecified open wound of left breast, subsequent encounter: Secondary | ICD-10-CM

## 2019-09-30 DIAGNOSIS — S21001A Unspecified open wound of right breast, initial encounter: Secondary | ICD-10-CM

## 2019-09-30 MED ORDER — DICLOFENAC SODIUM 50 MG PO TBEC: 50.0000 mg | DELAYED_RELEASE_TABLET | Freq: Two times a day (BID) | ORAL | 0 refills | Status: AC | PRN

## 2019-09-30 MED ORDER — DOXYCYCLINE HYCLATE 100 MG PO TABS: 100.0000 mg | ORAL_TABLET | Freq: Two times a day (BID) | ORAL | 0 refills | Status: DC

## 2019-09-30 MED ORDER — LIDOCAINE-PRILOCAINE 2.5-2.5 % EX CREA: 1.0000 "application " | TOPICAL_CREAM | CUTANEOUS | 0 refills | Status: DC | PRN

## 2019-09-30 MED ORDER — HYDROCODONE-ACETAMINOPHEN 10-325 MG PO TABS: 1.0000 | ORAL_TABLET | Freq: Four times a day (QID) | ORAL | 0 refills | Status: AC | PRN

## 2019-09-30 NOTE — Addendum Note (Signed)
Addended by: Wallace Going on: 09/30/2019 12:07 PM   Modules accepted: Orders

## 2019-09-30 NOTE — Progress Notes (Addendum)
   Subjective:    Patient ID: Laura [name], female    DOB: 1971/07/18, 48 y.o.   MRN: [id number]  The patient is a 48 year old female here with her husband for follow-up on her breast patient she is concerned about increased pain, redness and swelling on the right breast.  She has opened up at the [place].  She has severe tenderness.  There is redness.  There is purulent drainage.  She does not have a fever.     Review of Systems  Constitutional: Negative.   HENT: Negative.   Eyes: Negative.   Respiratory: Negative.   Cardiovascular: Negative.   Gastrointestinal: Negative.   Endocrine: Negative.   Genitourinary: Negative.   Hematological: Negative.   Psychiatric/Behavioral: Negative.        Objective:   Physical Exam Vitals signs and nursing note reviewed.  Constitutional:      Appearance: Normal appearance.  HENT:     Head: Normocephalic and atraumatic.  Cardiovascular:     Rate and Rhythm: Normal rate.     Pulses: Normal pulses.  Pulmonary:     Effort: Pulmonary effort is normal.  Neurological:     General: No focal deficit present.     Mental Status: She is alert and oriented to person, place, and time.  Psychiatric:        Mood and Affect: Mood normal.        Behavior: Behavior normal.        Thought Content: Thought content normal.       Assessment & Plan:     ICD-10-CM   1. Open wound of left breast, subsequent encounter  S21.002D Tissue Culture  2. Open wound of right breast, initial encounter  S21.001A     The Steri-Strips were removed.  Cultures were obtained.  She should do showers daily and Vaseline to the area.  We discussed an option for being admitted and put on IV antibiotics.  At this time she would like to go home and give it 24 hours.  Should like to see if she can get some improvement.  She knows to call us with any worsening of any symptoms. We will call and check on her tomorrow.  I may do a telemetry visit with her on Friday.

## 2019-10-01 ENCOUNTER — Telehealth: Payer: Self-pay | Admitting: *Deleted

## 2019-10-01 NOTE — Telephone Encounter (Signed)
Faxed order to [facility] for supplie's to be sent to the patient's home.  Confirmation received.//AB/CMA

## 2019-10-01 NOTE — Telephone Encounter (Signed)
Called and spoke with the patient to see how she was doing.  She stated that Dr. Marla Roe called and checked on her.  She said she did pretty good last night.  She's taking the pain medication and the antibiotic.  She stated that when she's lying still it's good, and the pain medication helped.  She said she showered and she was fine.  She has not had a fever,and the pain and redness is not worse.  Informed the patient to please give me a call if she needs me.  Patient verbalized understanding and agreed.//AB/CMA

## 2019-10-02 LAB — AEROBIC CULTURE W GRAM STAIN (SUPERFICIAL SPECIMEN): Gram Stain: NONE SEEN

## 2019-10-02 NOTE — Telephone Encounter (Signed)
Received order status notification via of fax that [facility] has provided service for the patient.//AB/CMA

## 2019-10-03 ENCOUNTER — Encounter: Payer: Self-pay | Admitting: Plastic Surgery

## 2019-10-03 ENCOUNTER — Encounter: Payer: BC Managed Care – PPO | Admitting: Obstetrics and Gynecology

## 2019-10-03 ENCOUNTER — Ambulatory Visit (INDEPENDENT_AMBULATORY_CARE_PROVIDER_SITE_OTHER): Payer: BC Managed Care – PPO | Admitting: Plastic Surgery

## 2019-10-03 ENCOUNTER — Ambulatory Visit: Payer: BC Managed Care – PPO | Admitting: Surgical

## 2019-10-03 ENCOUNTER — Other Ambulatory Visit: Payer: Self-pay

## 2019-10-03 VITALS — BP 131/85 | HR 95 | Temp 97.5°F | Ht 66.0 in | Wt 234.0 lb

## 2019-10-03 DIAGNOSIS — S21001A Unspecified open wound of right breast, initial encounter: Secondary | ICD-10-CM

## 2019-10-03 NOTE — Progress Notes (Signed)
The patient is a 48 year old female here for follow-up on her right breast wound.  She is doing much better.  The area seems to be declaring itself.  There is no further breakdown.  The redness has improved. I think that the antibiotic is working well.  I placed some donated ACell sheet and powder on the right breast.  She is to use the [facility] dressing changes daily and I would like to see her back early next week.  Vaseline to the left breast.  Call with any questions or any change in status.  Patient agrees with the plan.

## 2019-10-08 ENCOUNTER — Encounter: Payer: BC Managed Care – PPO | Admitting: Obstetrics and Gynecology

## 2019-10-09 ENCOUNTER — Ambulatory Visit (INDEPENDENT_AMBULATORY_CARE_PROVIDER_SITE_OTHER): Payer: BC Managed Care – PPO | Admitting: Surgical

## 2019-10-09 ENCOUNTER — Other Ambulatory Visit: Payer: Self-pay

## 2019-10-09 ENCOUNTER — Encounter: Payer: Self-pay | Admitting: Surgical

## 2019-10-09 VITALS — BP 123/81 | HR 76 | Temp 97.5°F | Ht 66.0 in | Wt 237.6 lb

## 2019-10-09 DIAGNOSIS — S21001A Unspecified open wound of right breast, initial encounter: Secondary | ICD-10-CM

## 2019-10-09 DIAGNOSIS — S21002D Unspecified open wound of left breast, subsequent encounter: Secondary | ICD-10-CM

## 2019-10-09 MED ORDER — FLUCONAZOLE 150 MG PO TABS: 150.0000 mg | ORAL_TABLET | Freq: Once | ORAL | 0 refills | Status: AC

## 2019-10-09 MED ORDER — CIPROFLOXACIN HCL 500 MG PO TABS: 500.0000 mg | ORAL_TABLET | Freq: Two times a day (BID) | ORAL | 0 refills | Status: AC

## 2019-10-09 NOTE — Progress Notes (Signed)
   Subjective:     Patient ID: Laura [name], female    DOB: [birth year], 48 y.o.   MRN: [id number]  Chief Complaint  Patient presents with  . Follow-up    1 week    HPI: The patient is a 48 y.o. female here for follow-up on right breast wound.  At her last visit donated ACell was applied.  She is doing really well.  She has not had any fevers, chills, nausea, vomiting.  Redness has significantly improved.  She has been taking doxycycline twice a day.  They have been doing daily [place] dressing changes.  She just showered yesterday. She has been applying Vaseline to the left breast small wound at the junction of the vertical limb and inframammary fold.  She has made significant progress since her last visit.  Donated as well as incorporating into the wound and she is improving to slightly epithelialize around the edges.  She does not have any purulent drainage.  She does report that in the past with antibiotics she has developed a yeast infection.  Her wound culture grew Serratia marcescens and it was sensitive to ciprofloxacin.  She has had intermediate breast pain, consistent with nerve pain.  Review of Systems  Constitutional: Positive for activity change. Negative for appetite change, chills, diaphoresis, fatigue and fever.  Respiratory: Negative.   Cardiovascular: Negative.   Gastrointestinal: Negative.   Musculoskeletal: Positive for myalgias (Breast pain, intermittent).  Skin: Positive for color change and wound. Negative for pallor and rash.  Neurological: Negative.      Objective:   Vital Signs BP 123/81 (BP Location: Left Arm, Patient Position: Sitting, Cuff Size: Large)   Pulse 76   Temp (!) 97.5 F (36.4 C) (Temporal)   Ht 5\' 6"  (1.676 m)   Wt 237 lb 9.6 oz (107.8 kg)   SpO2 98%   BMI 38.35 kg/m  Vital Signs and Nursing Note Reviewed Chaperone present Physical Exam  Constitutional: She is oriented to person, place, and time and well-developed, well-nourished, and  in no distress.  HENT:  Head: Normocephalic and atraumatic.  Cardiovascular: Normal rate.  Pulmonary/Chest: Effort normal.    Right breast wound with good granulation tissue noted. There is no drainage, no purulence. No surrounding erythema noted. Epithelial tissue has begun to elongate.  Left breast well healed, small wound at vertical limb at [facility].  Abdominal: Soft.  Musculoskeletal: Normal range of motion.        General: No tenderness, deformity or edema.  Neurological: She is alert and oriented to person, place, and time. Gait normal.  Skin: Skin is warm and dry. No rash noted. She is not diaphoretic. No erythema.  Psychiatric: Mood and affect normal.      Assessment/Plan:     ICD-10-CM   1. Open wound of right breast, initial encounter  S21.001A   2. Open wound of left breast, subsequent encounter  S21.002D    Donated ACell applied to right breast wound.  Continue with daily [place] dressing changes.  Overall Mrs. Betteridge is doing really well, she has not had any fevers, chills, nausea, vomiting.   Continue with Vaseline to the left breast inframammary fold small wound.  Follow-up in approximately 5 days.  Ciprofloxacin and Diflucan sent to pharmacy.  Continue Cipro for 7 days.  Pictures were obtained of the patient and placed in the chart with the patient's or guardian's permission.   Carola Rhine Mickey Esguerra, PA-C 10/09/2019, 3:34 PM

## 2019-10-10 ENCOUNTER — Ambulatory Visit: Payer: BC Managed Care – PPO | Admitting: Plastic Surgery

## 2019-10-13 ENCOUNTER — Telehealth: Payer: Self-pay

## 2019-10-13 NOTE — Telephone Encounter (Signed)

## 2019-10-14 ENCOUNTER — Other Ambulatory Visit: Payer: Self-pay

## 2019-10-14 ENCOUNTER — Encounter: Payer: Self-pay | Admitting: Plastic Surgery

## 2019-10-14 ENCOUNTER — Ambulatory Visit (INDEPENDENT_AMBULATORY_CARE_PROVIDER_SITE_OTHER): Payer: BC Managed Care – PPO | Admitting: Plastic Surgery

## 2019-10-14 ENCOUNTER — Ambulatory Visit: Payer: BC Managed Care – PPO | Admitting: Plastic Surgery

## 2019-10-14 VITALS — BP 137/87 | Temp 97.5°F | Wt 238.0 lb

## 2019-10-14 DIAGNOSIS — N6489 Other specified disorders of breast: Secondary | ICD-10-CM

## 2019-10-14 DIAGNOSIS — S21001A Unspecified open wound of right breast, initial encounter: Secondary | ICD-10-CM

## 2019-10-14 NOTE — Progress Notes (Signed)
The patient is a 49 year old female here with her husband.  She is doing significantly better.  It looks like the depth of the wound has markedly improved.  The right breast the area is now superficial.  She has been using the K-Y jelly daily.  Donated ACell was applied.  She can shower in 3 days and we will see her back in 2 weeks.  Continue with [place] to the area daily until Friday and then she can go back to Vaseline.

## 2019-10-20 ENCOUNTER — Telehealth: Payer: Self-pay | Admitting: *Deleted

## 2019-10-22 NOTE — Telephone Encounter (Signed)
Received order status notification via fax on (10/10/19) from [facility] stating that service has been provided to the patient.//AB/CMA    Faxed order to [facility] on (10/10/19) for medical supplies.  Confirmation received.//AB/CMA

## 2019-10-22 NOTE — Telephone Encounter (Signed)
Received order status notification via fax on (10/01/19) from [facility] stating that service has been provided to the patient.//AB/CMA   Faxed order on (09/30/19) to [facility] for medical supplies.  Confirmation received.//AB/CMA

## 2019-10-28 ENCOUNTER — Ambulatory Visit (INDEPENDENT_AMBULATORY_CARE_PROVIDER_SITE_OTHER): Payer: BC Managed Care – PPO | Admitting: Plastic Surgery

## 2019-10-28 ENCOUNTER — Other Ambulatory Visit: Payer: Self-pay

## 2019-10-28 ENCOUNTER — Encounter: Payer: Self-pay | Admitting: Plastic Surgery

## 2019-10-28 VITALS — BP 151/87 | HR 78 | Temp 97.1°F | Ht 66.0 in | Wt 246.0 lb

## 2019-10-28 DIAGNOSIS — N6489 Other specified disorders of breast: Secondary | ICD-10-CM

## 2019-10-28 DIAGNOSIS — S21001A Unspecified open wound of right breast, initial encounter: Secondary | ICD-10-CM

## 2019-10-28 NOTE — Progress Notes (Signed)
   Subjective:    Patient ID: Laura [name], female    DOB: 1971-04-26, 48 y.o.   MRN: [id number]  The patient is a 48 year old female here for follow-up after bilateral breast reductions.  She had breakdown on the right side with a resulting wound.  Fortunately she is doing extremely well.  She has been using the Vaseline.  The wound is now 1 x 5 cm and no sign of infection.  The overall breast is not red not tender.  There is no drainage.     Review of Systems  Constitutional: Negative.   HENT: Negative.   Respiratory: Negative.   Cardiovascular: Negative.   Gastrointestinal: Negative.   Genitourinary: Negative.   Musculoskeletal: Negative.   Hematological: Negative.   Psychiatric/Behavioral: Negative.        Objective:   Physical Exam Vitals signs and nursing note reviewed.  Constitutional:      Appearance: Normal appearance.  Cardiovascular:     Rate and Rhythm: Normal rate.     Pulses: Normal pulses.  Pulmonary:     Effort: Pulmonary effort is normal.  Neurological:     General: No focal deficit present.     Mental Status: She is alert.  Psychiatric:        Mood and Affect: Mood normal.        Behavior: Behavior normal.        Assessment & Plan:     ICD-10-CM   1. Postoperative breast asymmetry  N64.89   2. Open wound of right breast, initial encounter  S21.001A     Continue with Vaseline to the area.  Make sure she is eating healthy and taking a multivitamin and vitamin C.  I had like to see her back in 3 to 4 weeks.

## 2019-11-18 ENCOUNTER — Encounter: Payer: Self-pay | Admitting: Plastic Surgery

## 2019-11-18 ENCOUNTER — Ambulatory Visit (INDEPENDENT_AMBULATORY_CARE_PROVIDER_SITE_OTHER): Payer: BC Managed Care – PPO | Admitting: Plastic Surgery

## 2019-11-18 ENCOUNTER — Other Ambulatory Visit: Payer: Self-pay

## 2019-11-18 VITALS — BP 137/79 | HR 67 | Temp 98.6°F | Ht 66.0 in | Wt 242.0 lb

## 2019-11-18 DIAGNOSIS — S21001A Unspecified open wound of right breast, initial encounter: Secondary | ICD-10-CM

## 2019-11-18 DIAGNOSIS — N6489 Other specified disorders of breast: Secondary | ICD-10-CM

## 2019-11-18 NOTE — Progress Notes (Signed)
   Subjective:    Patient ID: Laura [name], female    DOB: [birth year], 48 y.o.   MRN: [id number]  The patient is a 48 year old female here for follow-up on her breast surgery.  She underwent a reduction in symmetry surgery after terrible infection on the left.  She then had skin breakdown on the right.  She was using donated ACell and then collagen.  Overall she has done extremely well.  There is no sign of infection.  She is not had any fevers and there is no redness.  The skin is closed and now there is scar tissue.  There is a little bit of asymmetry between the 2 breasts.     Review of Systems  Constitutional: Negative.   HENT: Negative.   Eyes: Negative.   Respiratory: Negative.   Cardiovascular: Negative.   Genitourinary: Negative.   Musculoskeletal: Negative.   Hematological: Negative.   Psychiatric/Behavioral: Negative.        Objective:   Physical Exam Vitals and nursing note reviewed.  Constitutional:      Appearance: Normal appearance.  Cardiovascular:     Rate and Rhythm: Normal rate.  Pulmonary:     Effort: Pulmonary effort is normal.  Neurological:     General: No focal deficit present.     Mental Status: She is alert and oriented to person, place, and time.  Psychiatric:        Mood and Affect: Mood normal.        Behavior: Behavior normal.        Assessment & Plan:     ICD-10-CM   1. Postoperative breast asymmetry  N64.89   2. Open wound of right breast, initial encounter  S21.001A     Start with massage and silicone sheets to help with the scar.  I would like to see her back in 2 weeks.

## 2019-12-08 ENCOUNTER — Encounter: Payer: Self-pay | Admitting: Internal Medicine

## 2019-12-08 ENCOUNTER — Ambulatory Visit (INDEPENDENT_AMBULATORY_CARE_PROVIDER_SITE_OTHER): Payer: BC Managed Care – PPO | Admitting: Internal Medicine

## 2019-12-08 VITALS — Ht 66.0 in | Wt 242.0 lb

## 2019-12-08 DIAGNOSIS — K760 Fatty (change of) liver, not elsewhere classified: Secondary | ICD-10-CM

## 2019-12-08 DIAGNOSIS — E669 Obesity, unspecified: Secondary | ICD-10-CM

## 2019-12-08 DIAGNOSIS — R635 Abnormal weight gain: Secondary | ICD-10-CM

## 2019-12-08 DIAGNOSIS — I10 Essential (primary) hypertension: Secondary | ICD-10-CM | POA: Diagnosis not present

## 2019-12-08 DIAGNOSIS — E538 Deficiency of other specified B group vitamins: Secondary | ICD-10-CM

## 2019-12-08 DIAGNOSIS — Z0000 Encounter for general adult medical examination without abnormal findings: Secondary | ICD-10-CM

## 2019-12-08 DIAGNOSIS — F411 Generalized anxiety disorder: Secondary | ICD-10-CM

## 2019-12-08 DIAGNOSIS — F419 Anxiety disorder, unspecified: Secondary | ICD-10-CM

## 2019-12-08 DIAGNOSIS — F5105 Insomnia due to other mental disorder: Secondary | ICD-10-CM

## 2019-12-08 NOTE — Assessment & Plan Note (Addendum)
Controlled  with Pristiq.  Marland Kitchen  Refills given

## 2019-12-08 NOTE — Progress Notes (Signed)
Virtual Visit via [email or web address]  This visit type was conducted due to national recommendations for restrictions regarding the COVID-19 pandemic (e.g. social distancing).  This format is felt to be most appropriate for this patient at this time.  All issues noted in this document were discussed and addressed.  No physical exam was performed (except for noted visual exam findings with Video Visits).   I connected with@ on 12/08/19 at  8:30 AM EST by a video enabled telemedicine application  and verified that I am speaking with the correct person using two identifiers. Location patient: home Location provider: work or home office Persons participating in the virtual visit: patient, provider  I discussed the limitations, risks, security and privacy concerns of performing an evaluation and management service by telephone and the availability of in person appointments. I also discussed with the patient that there may be a patient responsible charge related to this service. The patient expressed understanding and agreed to proceed.   Reason for visit: CPE  HPI:  The patient is here for annual preventive  examination and management of other chronic and acute problems.   The risk factors are reflected in the social history.  The roster of all physicians providing medical care to patient - is listed in the Snapshot section of the chart.  Activities of daily living:  The patient is 100% independent in all ADLs: dressing, toileting, feeding as well as independent mobility  Home safety : The patient has smoke detectors in the home. They wear seatbelts.  There are no firearms at home. There is no violence in the home.   There is no risks for hepatitis, STDs or HIV. There is no   history of blood transfusion. They have no travel history to infectious disease endemic areas of the world.  The patient has seen their dentist in the last six month.  They do not  have excessive sun exposure. Discussed the need for  sun protection: hats, long sleeves and use of sunscreen if there is significant sun exposure.   Diet: the importance of a healthy diet is discussed. They do have a healthy diet.  The benefits of regular aerobic exercise were discussed. She walks 4 times per week ,  20 minutes.   Depression screen: there are no signs or vegative symptoms of depression- irritability, change in appetite, anhedonia, sadness/tearfullness.  The following portions of the patient's history were reviewed and updated as appropriate: allergies, current medications, past family history, past medical history,  past surgical history, past social history  and problem list.  Visual acuity was not assessed per patient preference since she has regular follow up with her ophthalmologist. Hearing and body mass index were assessed and reviewed.   During the course of the visit the patient was educated and counseled about appropriate screening and preventive services including : fall prevention , diabetes screening, nutrition counseling, colorectal cancer screening, and recommended immunizations.      CC:    1) 20 lb weight gain since COVID 19 EPIDEMIC STARTED . NOT EXERCISING,   Discussed diet and exercise  2) Right Breast infection  in October following surgery to correct asymmetry caused by left breast infection .  3) Anxiety under control with pristiq,  Rare use of alprazolam  4) Mirena IUD placed 2 years ago.  nexg PAP due 2022.  defrancesco has retired,  Prefers to come to our office for PAP   Amenorrhea but no symptoms of menopause yet.  ROS: See pertinent positives and negatives  per HPI.  Past Medical History:  Diagnosis Date  . Allergy   . Dysplasia of cervix   . Herpes genitalia   . Hypertension    HX of high readings   . Increased BMI   . Menstrual migraine     Past Surgical History:  Procedure Laterality Date  . BREAST BIOPSY Left 10/2018   removal of abscess on the skin-  . DILATION AND CURETTAGE OF  UTERUS  2014 FEB and August  . INCISION AND DRAINAGE ABSCESS Left 10/31/2018   Procedure: INCISION AND DRAINAGE LEFT BREAST ABSCESS;  Surgeon: Olean Ree, MD;  Location: [facility];  Service: General;  Laterality: Left;  . IRRIGATION AND DEBRIDEMENT ABSCESS Left 11/05/2018   Procedure: IRRIGATION AND DEBRIDEMENT ABSCESS - BREAST;  Surgeon: Olean Ree, MD;  Location: [facility];  Service: General;  Laterality: Left;  Marland Kitchen MASTOPEXY Bilateral 09/10/2019   Procedure: MASTOPEXY;  Surgeon: Wallace Going, DO;  Location: [place];  Service: Plastics;  Laterality: Bilateral;  . MOUTH SURGERY    . SCAR REVISION Left 09/10/2019   Procedure: left breast scar contracture excision;  Surgeon: Wallace Going, DO;  Location: [place];  Service: Plastics;  Laterality: Left;  2.5 hours for case, please  . TONSILECTOMY/ADENOIDECTOMY WITH MYRINGOTOMY Bilateral 2011    Family History  Problem Relation Age of Onset  . Arthritis Mother   . Hypertension Mother   . Diabetes Mother   . Heart disease Father   . Hyperlipidemia Father   . Hypertension Father   . Cancer Neg Hx   . Breast cancer Neg Hx     SOCIAL HX: married to Engineer, structural . 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    reports that she has never smoked. She has never used smokeless tobacco. She reports current alcohol use. She reports that she does not use drugs.  Current Outpatient Medications:  .  acetaminophen (TYLENOL) 500 MG tablet, Take 2 tablets (1,000 mg total) by mouth every 6 (six) hours as needed for mild pain or fever., Disp: 30 tablet, Rfl: 0 .  ALPRAZolam (XANAX) 0.5 MG tablet, Take 1 tablet (0.5 mg total) by mouth at bedtime as needed for anxiety., Disp: 30 tablet, Rfl: 3 .  cyanocobalamin (,VITAMIN B-12,) 1000 MCG/ML injection, INJECT 1 ML (1,000 MCG TOTAL) INTO THE MUSCLE ONCE A WEEK., Disp: 12 mL, Rfl: 5 .   desvenlafaxine (PRISTIQ) 50 MG 24 hr tablet, TAKE 1 TABLET BY MOUTH EVERY DAY, Disp: 90 tablet, Rfl: 1 .  esomeprazole (NEXIUM) 20 MG capsule, Take 20 mg by mouth daily at 12 noon., Disp: , Rfl:  .  Lactobacillus (PROBIOTIC ACIDOPHILUS PO), Take 1 tablet by mouth daily., Disp: , Rfl:  .  levonorgestrel (MIRENA) 20 MCG/24HR IUD, 1 each by Intrauterine route once., Disp: , Rfl:  .  losartan-hydrochlorothiazide (HYZAAR) 50-12.5 MG tablet, Take 1 tablet by mouth daily., Disp: 90 tablet, Rfl: 1 .  methocarbamol (ROBAXIN) 750 MG tablet, Take 1 tablet (750 mg total) by mouth 4 (four) times daily., Disp: 60 tablet, Rfl: 2 .  Multiple Vitamin (MULTIVITAMIN) tablet, Take 1 tablet by mouth daily., Disp: , Rfl:  .  Syringe/Needle, Disp, (SYRINGE 3CC/25GX1") 25G X 1" 3 ML MISC, Use for b12 injections, Disp: 50 each, Rfl: 0  EXAM:  VITALS per patient if applicable:  GENERAL: alert, oriented, appears well and in no acute distress  HEENT:  atraumatic, conjunttiva clear, no obvious abnormalities on inspection of external nose and ears  NECK: normal movements of the head and neck  LUNGS: on inspection no signs of respiratory distress, breathing rate appears normal, no obvious gross SOB, gasping or wheezing  CV: no obvious cyanosis  MS: moves all visible extremities without noticeable abnormality  PSYCH/NEURO: pleasant and cooperative, no obvious depression or anxiety, speech and thought processing grossly intact  ASSESSMENT AND PLAN:  Discussed the following assessment and plan:  B12 deficiency - Plan: Vitamin B12  Hepatic steatosis - Plan: Comprehensive metabolic panel  Essential hypertension - Plan: Lipid panel  Excessive body weight gain - Plan: Thyroid Panel With TSH, Hemoglobin A1c  GAD (generalized anxiety disorder)  Insomnia secondary to anxiety  Obesity (BMI 35.0-39.9 without comorbidity)  Encounter for preventive health examination  Essential hypertension Readings at Dr  Eusebio Friendly office have been normal on losartn hct . CR and lyytes normal in October ,  No changes today    GAD (generalized anxiety disorder) Controlled  with Pristiq.  Marland Kitchen  Refills given   Insomnia secondary to anxiety Managed with rare  use of alprazolam . The risks and benefits of  Chronic  benzodiazepine use were reviewed with patient today including increased risk of dementia,  Addiction, and seizures if abruptly withdrawn  .    Obesity (BMI 35.0-39.9 without comorbidity) I have addressed  BMI and recommended a low glycemic index diet utilizing smaller more frequent meals to increase metabolism.  I have also recommended that patient start exercising with a goal of 30 minutes of aerobic exercise a minimum of 5 days per week. Screening for lipid disorders, thyroid and diabetes to be done    Encounter for preventive health examination age appropriate education and counseling updated, referrals for preventative services and immunizations addressed, dietary and smoking counseling addressed, most recent labs reviewed.  I have personally reviewed and have noted:  1) the patient's medical and social history 2) The pt's use of alcohol, tobacco, and illicit drugs 3) The patient's current medications and supplements 4) Functional ability including ADL's, fall risk, home safety risk, hearing and visual impairment 5) Diet and physical activities 6) Evidence for depression or mood disorder 7) The patient's height, weight, and BMI have been recorded in the chart  I have made referrals, and provided counseling and education based on review of the above. She has been advised to check with her insurance about what form of colon ca screening they will cover (colonoscopy vs cologuard)    I discussed the assessment and treatment plan with the patient. The patient was provided an opportunity to ask questions and all were answered. The patient agreed with the plan and demonstrated an understanding of the  instructions.   The patient was advised to call back or seek an in-person evaluation if the symptoms worsen or if the condition fails to improve as anticipated.  Crecencio Mc, MD

## 2019-12-08 NOTE — Assessment & Plan Note (Signed)
Managed with rare  use of alprazolam . The risks and benefits of  Chronic  benzodiazepine use were reviewed with patient today including increased risk of dementia,  Addiction, and seizures if abruptly withdrawn  .   

## 2019-12-08 NOTE — Assessment & Plan Note (Addendum)
Readings at Dr Dillingham's office have been normal on losartn hct . CR and lyytes normal in October ,  No changes today

## 2019-12-08 NOTE — Assessment & Plan Note (Signed)
age appropriate education and counseling updated, referrals for preventative services and immunizations addressed, dietary and smoking counseling addressed, most recent labs reviewed.  I have personally reviewed and have noted:  1) the patient's medical and social history 2) The pt's use of alcohol, tobacco, and illicit drugs 3) The patient's current medications and supplements 4) Functional ability including ADL's, fall risk, home safety risk, hearing and visual impairment 5) Diet and physical activities 6) Evidence for depression or mood disorder 7) The patient's height, weight, and BMI have been recorded in the chart  I have made referrals, and provided counseling and education based on review of the above. She has been advised to check with her insurance about what form of colon ca screening they will cover (colonoscopy vs cologuard)

## 2019-12-08 NOTE — Assessment & Plan Note (Signed)
I have addressed  BMI and recommended a low glycemic index diet utilizing smaller more frequent meals to increase metabolism.  I have also recommended that patient start exercising with a goal of 30 minutes of aerobic exercise a minimum of 5 days per week. Screening for lipid disorders, thyroid and diabetes to be done   

## 2019-12-08 NOTE — Patient Instructions (Signed)
Check with your insurance about the cologuard test as a covered screening test for colon cancer.   The office will call with the lab appointment    Health Maintenance, Female Adopting a healthy lifestyle and getting preventive care are important in promoting health and wellness. Ask your health care provider about:  The right schedule for you to have regular tests and exams.  Things you can do on your own to prevent diseases and keep yourself healthy. What should I know about diet, weight, and exercise? Eat a healthy diet   Eat a diet that includes plenty of vegetables, fruits, low-fat dairy products, and lean protein.  Do not eat a lot of foods that are high in solid fats, added sugars, or sodium. Maintain a healthy weight Body mass index (BMI) is used to identify weight problems. It estimates body fat based on height and weight. Your health care provider can help determine your BMI and help you achieve or maintain a healthy weight. Get regular exercise Get regular exercise. This is one of the most important things you can do for your health. Most adults should:  Exercise for at least 150 minutes each week. The exercise should increase your heart rate and make you sweat (moderate-intensity exercise).  Do strengthening exercises at least twice a week. This is in addition to the moderate-intensity exercise.  Spend less time sitting. Even light physical activity can be beneficial. Watch cholesterol and blood lipids Have your blood tested for lipids and cholesterol at 49 years of age, then have this test every 5 years. Have your cholesterol levels checked more often if:  Your lipid or cholesterol levels are high.  You are older than 49 years of age.  You are at high risk for heart disease. What should I know about cancer screening? Depending on your health history and family history, you may need to have cancer screening at various ages. This may include screening for:  Breast  cancer.  Cervical cancer.  Colorectal cancer.  Skin cancer.  Lung cancer. What should I know about heart disease, diabetes, and high blood pressure? Blood pressure and heart disease  High blood pressure causes heart disease and increases the risk of stroke. This is more likely to develop in people who have high blood pressure readings, are of African descent, or are overweight.  Have your blood pressure checked: ? Every 3-5 years if you are 49-45 years of age ? Every year if you are 3 years old or older. Diabetes Have regular diabetes screenings. This checks your fasting blood sugar level. Have the screening done:  Once every three years after age 67 if you are at a normal weight and have a low risk for diabetes.  More often and at a younger age if you are overweight or have a high risk for diabetes. What should I know about preventing infection? Hepatitis B If you have a higher risk for hepatitis B, you should be screened for this virus. Talk with your health care provider to find out if you are at risk for hepatitis B infection. Hepatitis C Testing is recommended for:  Everyone born from [birth year] through 1965.  Anyone with known risk factors for hepatitis C. Sexually transmitted infections (STIs)  Get screened for STIs, including gonorrhea and chlamydia, if: ? You are sexually active and are younger than 49 years of age. ? You are older than 49 years of age and your health care provider tells you that you are at risk for this type  of infection. ? Your sexual activity has changed since you were last screened, and you are at increased risk for chlamydia or gonorrhea. Ask your health care provider if you are at risk.  Ask your health care provider about whether you are at high risk for HIV. Your health care provider may recommend a prescription medicine to help prevent HIV infection. If you choose to take medicine to prevent HIV, you should first get tested for HIV. You should  then be tested every 3 months for as long as you are taking the medicine. Pregnancy  If you are about to stop having your period (premenopausal) and you may become pregnant, seek counseling before you get pregnant.  Take 400 to 800 micrograms (mcg) of folic acid every day if you become pregnant.  Ask for birth control (contraception) if you want to prevent pregnancy. Osteoporosis and menopause Osteoporosis is a disease in which the bones lose minerals and strength with aging. This can result in bone fractures. If you are 49 years old or older, or if you are at risk for osteoporosis and fractures, ask your health care provider if you should:  Be screened for bone loss.  Take a calcium or vitamin D supplement to lower your risk of fractures.  Be given hormone replacement therapy (HRT) to treat symptoms of menopause. Follow these instructions at home: Lifestyle  Do not use any products that contain nicotine or tobacco, such as cigarettes, e-cigarettes, and chewing tobacco. If you need help quitting, ask your health care provider.  Do not use street drugs.  Do not share needles.  Ask your health care provider for help if you need support or information about quitting drugs. Alcohol use  Do not drink alcohol if: ? Your health care provider tells you not to drink. ? You are pregnant, may be pregnant, or are planning to become pregnant.  If you drink alcohol: ? Limit how much you use to 0-1 drink a day. ? Limit intake if you are breastfeeding.  Be aware of how much alcohol is in your drink. In the [place] one drink equals one 12 oz bottle of beer (355 mL), one 5 oz glass of wine (148 mL), or one 1 oz glass of hard liquor (44 mL). General instructions  Schedule regular health, dental, and eye exams.  Stay current with your vaccines.  Tell your health care provider if: ? You often feel depressed. ? You have ever been abused or do not feel safe at home. Summary  Adopting a  healthy lifestyle and getting preventive care are important in promoting health and wellness.  Follow your health care provider's instructions about healthy diet, exercising, and getting tested or screened for diseases.  Follow your health care provider's instructions on monitoring your cholesterol and blood pressure. This information is not intended to replace advice given to you by your health care provider. Make sure you discuss any questions you have with your health care provider. Document Revised: 10/30/2018 Document Reviewed: 10/30/2018 Elsevier Patient Education  2020 [employer].

## 2019-12-12 ENCOUNTER — Other Ambulatory Visit: Payer: Self-pay | Admitting: Internal Medicine

## 2019-12-12 IMAGING — MG DIGITAL SCREENING BILATERAL MAMMOGRAM WITH TOMO AND CAD
8 series · 8 of 24 positions shown · non-contrast
Comparison: Previous exam(s).

CLINICAL DATA: Screening.

EXAM:
DIGITAL SCREENING BILATERAL MAMMOGRAM WITH TOMO AND CAD

[L CC synth-2D]
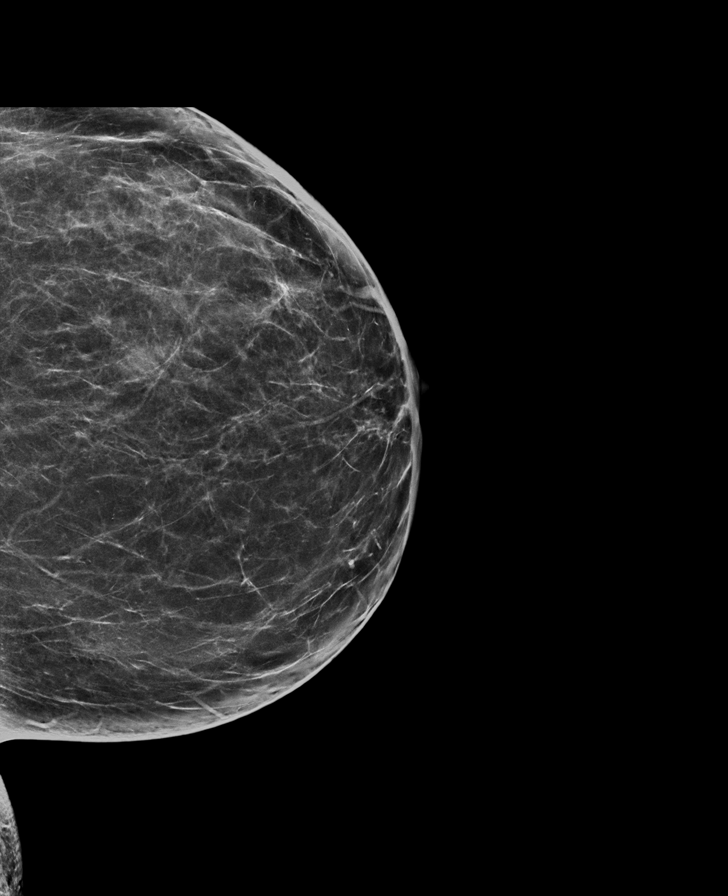

[L MLO synth-2D]
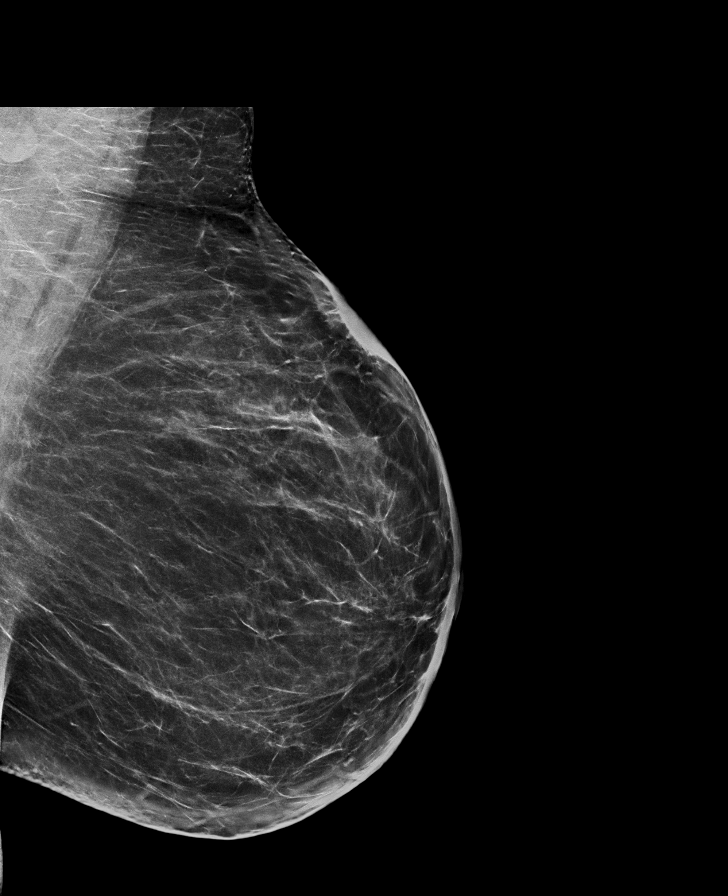

[R MLO synth-2D]
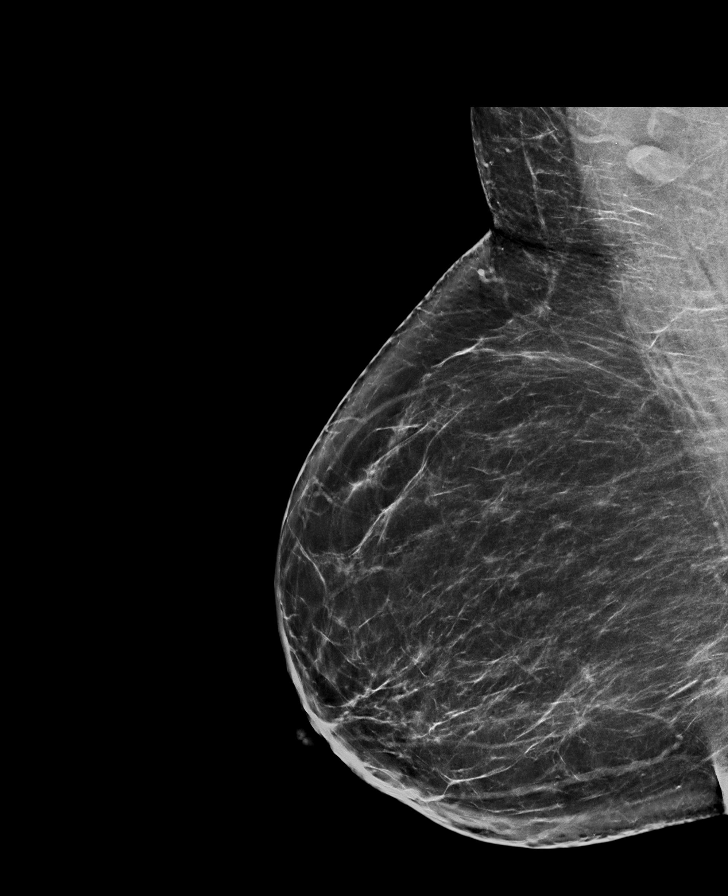

[R CC synth-2D]
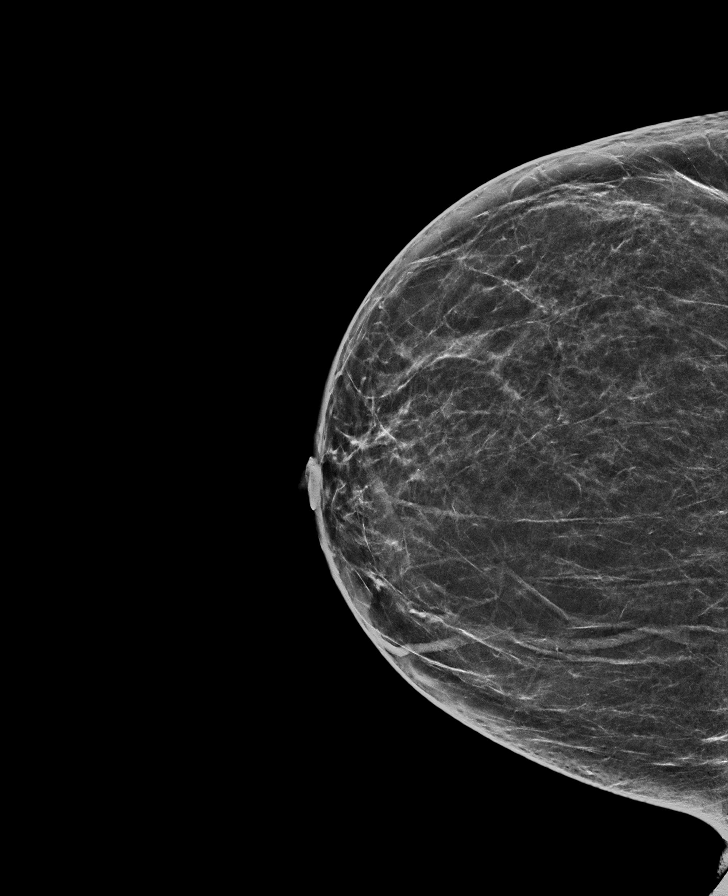

[L MLO tomo · tomo slice 45/90.0]
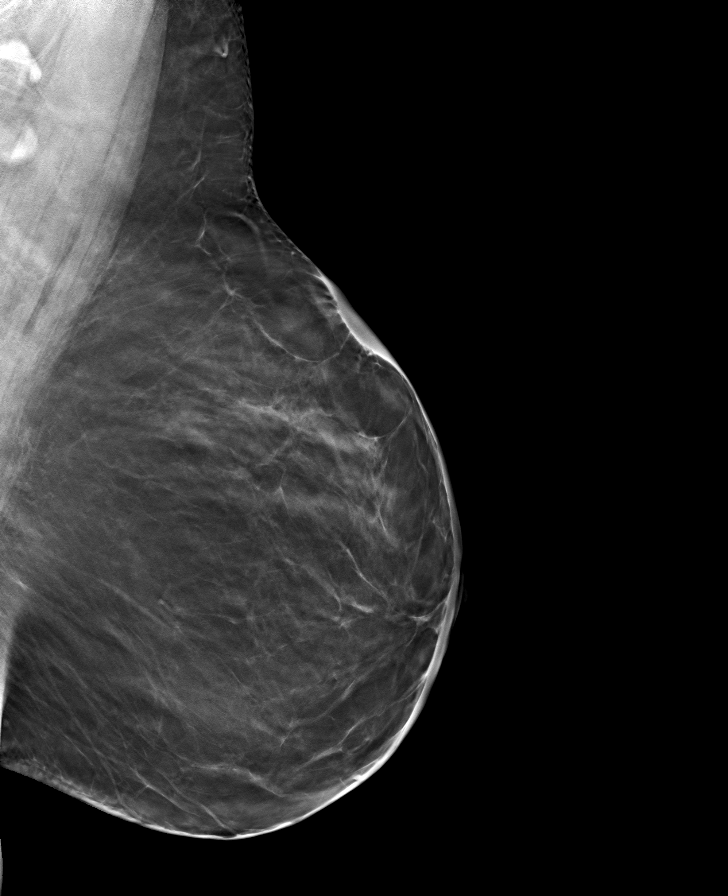

[R CC tomo · tomo slice 35/70.0]
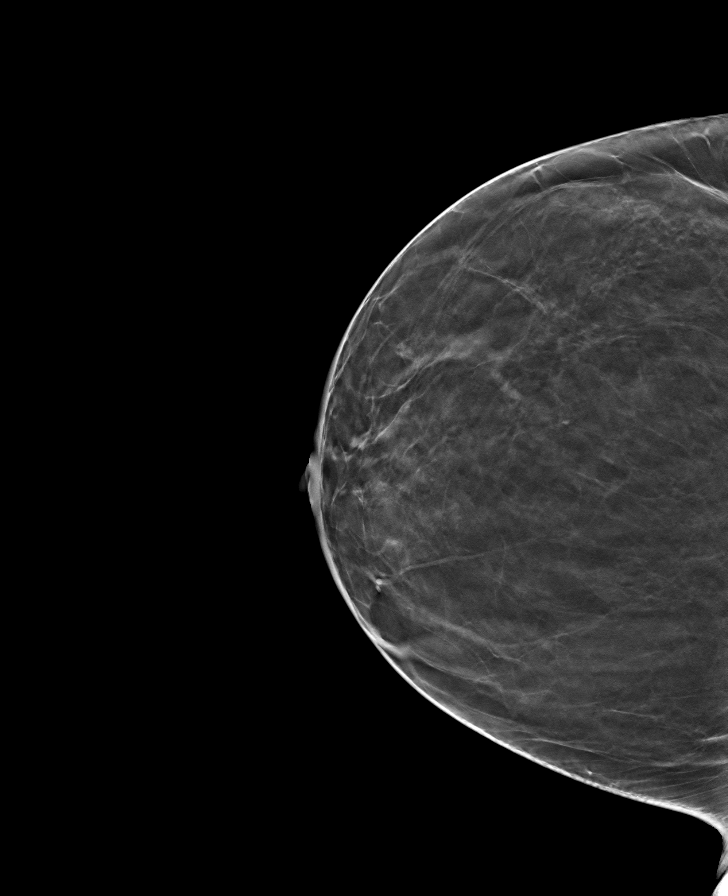

[R MLO tomo · tomo slice 44/87.0]
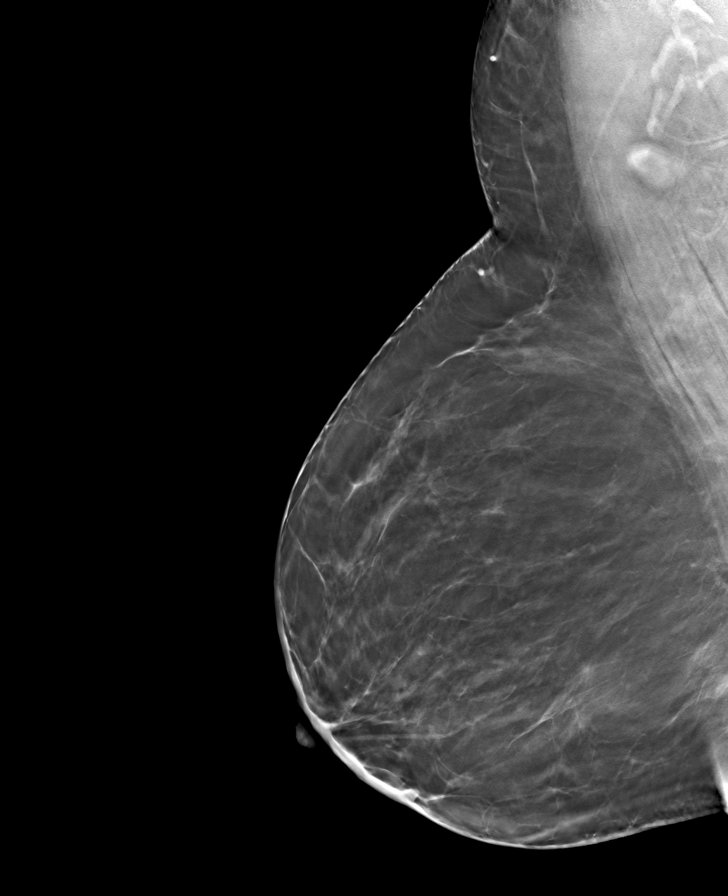

[L CC tomo · tomo slice 39/78.0]
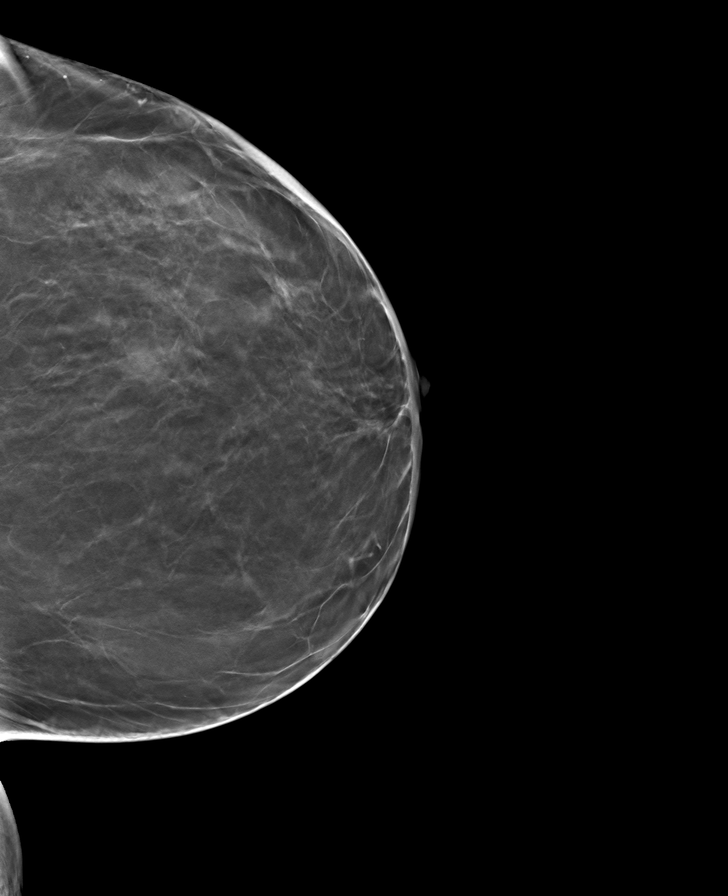

[8 of 24 positions shown; findings below may reference images not displayed]

ACR Breast Density Category b: There are scattered areas of
fibroglandular density.
FINDINGS: In the left breast, a possible asymmetry warrants further
evaluation. In the right breast, no findings suspicious for
malignancy. Images were processed with CAD.
IMPRESSION: Further evaluation is suggested for possible asymmetry in the left
breast.

RECOMMENDATION:
Diagnostic mammogram and possibly ultrasound of the left breast.
(Code:[id number]-[id number])

The patient will be contacted regarding the findings, and additional
imaging will be scheduled.

BI-RADS CATEGORY  0: Incomplete. Need additional imaging evaluation
and/or prior mammograms for comparison.

## 2019-12-17 ENCOUNTER — Other Ambulatory Visit (INDEPENDENT_AMBULATORY_CARE_PROVIDER_SITE_OTHER): Payer: BC Managed Care – PPO

## 2019-12-17 ENCOUNTER — Other Ambulatory Visit: Payer: Self-pay

## 2019-12-17 DIAGNOSIS — R635 Abnormal weight gain: Secondary | ICD-10-CM | POA: Diagnosis not present

## 2019-12-17 DIAGNOSIS — E538 Deficiency of other specified B group vitamins: Secondary | ICD-10-CM

## 2019-12-17 DIAGNOSIS — I10 Essential (primary) hypertension: Secondary | ICD-10-CM

## 2019-12-17 DIAGNOSIS — K760 Fatty (change of) liver, not elsewhere classified: Secondary | ICD-10-CM | POA: Diagnosis not present

## 2019-12-17 LAB — COMPREHENSIVE METABOLIC PANEL
ALT: 22 U/L (ref 0–35)
AST: 18 U/L (ref 0–37)
Albumin: 3.8 g/dL (ref 3.5–5.2)
Alkaline Phosphatase: 48 U/L (ref 39–117)
BUN: 11 mg/dL (ref 6–23)
CO2: 28 mEq/L (ref 19–32)
Calcium: 8.8 mg/dL (ref 8.4–10.5)
Chloride: 102 mEq/L (ref 96–112)
Creatinine, Ser: 0.81 mg/dL (ref 0.40–1.20)
GFR: 75.16 mL/min (ref >60.00)
Glucose, Bld: 95 mg/dL (ref 70–99)
Potassium: 3.6 mEq/L (ref 3.5–5.1)
Sodium: 137 mEq/L (ref 135–145)
Total Bilirubin: 0.6 mg/dL (ref 0.2–1.2)
Total Protein: 6.7 g/dL (ref 6.0–8.3)

## 2019-12-17 LAB — LIPID PANEL
Cholesterol: 142 mg/dL (ref 0–200)
HDL: 62 mg/dL (ref >39.00)
LDL Cholesterol: 60 mg/dL (ref 0–99)
NonHDL: 80.26
Total CHOL/HDL Ratio: 2
Triglycerides: 102 mg/dL (ref 0.0–149.0)
VLDL: 20.4 mg/dL (ref 0.0–40.0)

## 2019-12-17 LAB — HEMOGLOBIN A1C: Hgb A1c MFr Bld: 5.7 % (ref 4.6–6.5)

## 2019-12-17 LAB — VITAMIN B12: Vitamin B-12: 1165 pg/mL — ABNORMAL HIGH (ref 211–911)

## 2019-12-18 LAB — THYROID PANEL WITH TSH
Free Thyroxine Index: 1.9 (ref 1.4–3.8)
T3 Uptake: 30 % (ref 22–35)
T4, Total: 6.4 ug/dL (ref 5.1–11.9)
TSH: 1.1 mIU/L

## 2019-12-24 MED ORDER — SAXENDA 18 MG/3ML ~~LOC~~ SOPN: 0.6000 mg | PEN_INJECTOR | Freq: Every day | SUBCUTANEOUS | 0 refills | Status: DC

## 2019-12-24 MED ORDER — PEN NEEDLES 31G X 6 MM MISC: 5 refills | Status: DC

## 2019-12-26 ENCOUNTER — Telehealth: Payer: Self-pay

## 2019-12-26 NOTE — Telephone Encounter (Signed)
PA for Saxenda has been submitted on covermymeds.  

## 2019-12-31 NOTE — Telephone Encounter (Signed)
PA was resubmitted on 12/31/2019.

## 2019-12-31 NOTE — Telephone Encounter (Signed)
Covermymeds called to check on PA [phone] ref# [id number]

## 2020-01-06 ENCOUNTER — Ambulatory Visit: Payer: BC Managed Care – PPO | Admitting: Plastic Surgery

## 2020-01-26 ENCOUNTER — Other Ambulatory Visit: Payer: Self-pay | Admitting: Internal Medicine

## 2020-02-19 ENCOUNTER — Other Ambulatory Visit: Payer: Self-pay | Admitting: Internal Medicine

## 2020-04-22 ENCOUNTER — Other Ambulatory Visit: Payer: Self-pay | Admitting: Internal Medicine

## 2020-05-14 ENCOUNTER — Telehealth: Payer: Self-pay

## 2020-05-14 NOTE — Telephone Encounter (Signed)
PA for saxenda has been submitted on covermymeds.  

## 2020-05-20 ENCOUNTER — Telehealth: Payer: Self-pay | Admitting: Internal Medicine

## 2020-05-20 NOTE — Telephone Encounter (Signed)
Can you handle?

## 2020-05-20 NOTE — Telephone Encounter (Signed)
Covermymeds called wanting to resubmit to ins for pt medication SAXENDA 18 MG/3ML SOPN ref#[id number]

## 2020-05-27 NOTE — Telephone Encounter (Signed)
Saxenda PA has been resubmitted.

## 2020-06-04 NOTE — Telephone Encounter (Signed)
PA for [employer] has been approved through 05/27/2021.

## 2020-06-08 ENCOUNTER — Other Ambulatory Visit: Payer: Self-pay | Admitting: Internal Medicine

## 2020-06-08 ENCOUNTER — Telehealth: Payer: Self-pay

## 2020-06-08 DIAGNOSIS — K760 Fatty (change of) liver, not elsewhere classified: Secondary | ICD-10-CM

## 2020-06-08 DIAGNOSIS — E1169 Type 2 diabetes mellitus with other specified complication: Secondary | ICD-10-CM

## 2020-06-08 DIAGNOSIS — I10 Essential (primary) hypertension: Secondary | ICD-10-CM

## 2020-06-08 NOTE — Telephone Encounter (Signed)
Pt would like to have labs done prior to her appt in August. I have ordered Lipid panel, CMP and microalbumin. Is there anything else that needs to be ordered?

## 2020-06-17 ENCOUNTER — Other Ambulatory Visit: Payer: Self-pay | Admitting: Internal Medicine

## 2020-07-01 ENCOUNTER — Other Ambulatory Visit: Payer: Self-pay

## 2020-07-01 ENCOUNTER — Other Ambulatory Visit (INDEPENDENT_AMBULATORY_CARE_PROVIDER_SITE_OTHER): Payer: BC Managed Care – PPO

## 2020-07-01 DIAGNOSIS — K760 Fatty (change of) liver, not elsewhere classified: Secondary | ICD-10-CM

## 2020-07-01 DIAGNOSIS — I10 Essential (primary) hypertension: Secondary | ICD-10-CM | POA: Diagnosis not present

## 2020-07-01 LAB — MICROALBUMIN / CREATININE URINE RATIO
Creatinine,U: 153.2 mg/dL
Microalb Creat Ratio: 0.6 mg/g (ref 0.0–30.0)
Microalb, Ur: 0.9 mg/dL (ref 0.0–1.9)

## 2020-07-01 LAB — LIPID PANEL
Cholesterol: 154 mg/dL (ref 0–200)
HDL: 51.6 mg/dL (ref >39.00)
LDL Cholesterol: 78 mg/dL (ref 0–99)
NonHDL: 101.94
Total CHOL/HDL Ratio: 3
Triglycerides: 119 mg/dL (ref 0.0–149.0)
VLDL: 23.8 mg/dL (ref 0.0–40.0)

## 2020-07-01 LAB — COMPREHENSIVE METABOLIC PANEL
ALT: 25 U/L (ref 0–35)
AST: 20 U/L (ref 0–37)
Albumin: 4 g/dL (ref 3.5–5.2)
Alkaline Phosphatase: 58 U/L (ref 39–117)
BUN: 21 mg/dL (ref 6–23)
CO2: 30 mEq/L (ref 19–32)
Calcium: 9.3 mg/dL (ref 8.4–10.5)
Chloride: 105 mEq/L (ref 96–112)
Creatinine, Ser: 1 mg/dL (ref 0.40–1.20)
GFR: 58.81 mL/min — ABNORMAL LOW (ref >60.00)
Glucose, Bld: 96 mg/dL (ref 70–99)
Potassium: 4.7 mEq/L (ref 3.5–5.1)
Sodium: 142 mEq/L (ref 135–145)
Total Bilirubin: 0.6 mg/dL (ref 0.2–1.2)
Total Protein: 6.6 g/dL (ref 6.0–8.3)

## 2020-07-01 LAB — HEMOGLOBIN A1C: Hgb A1c MFr Bld: 5.4 % (ref 4.6–6.5)

## 2020-07-05 ENCOUNTER — Other Ambulatory Visit: Payer: Self-pay

## 2020-07-05 ENCOUNTER — Encounter: Payer: Self-pay | Admitting: Internal Medicine

## 2020-07-05 ENCOUNTER — Ambulatory Visit (INDEPENDENT_AMBULATORY_CARE_PROVIDER_SITE_OTHER): Payer: BC Managed Care – PPO | Admitting: Internal Medicine

## 2020-07-05 VITALS — BP 116/72 | HR 76 | Temp 98.3°F | Resp 15 | Ht 66.0 in | Wt 212.0 lb

## 2020-07-05 DIAGNOSIS — R5383 Other fatigue: Secondary | ICD-10-CM

## 2020-07-05 DIAGNOSIS — F419 Anxiety disorder, unspecified: Secondary | ICD-10-CM | POA: Diagnosis not present

## 2020-07-05 DIAGNOSIS — R944 Abnormal results of kidney function studies: Secondary | ICD-10-CM | POA: Diagnosis not present

## 2020-07-05 DIAGNOSIS — IMO0001 Reserved for inherently not codable concepts without codable children: Secondary | ICD-10-CM

## 2020-07-05 DIAGNOSIS — E669 Obesity, unspecified: Secondary | ICD-10-CM

## 2020-07-05 DIAGNOSIS — F5105 Insomnia due to other mental disorder: Secondary | ICD-10-CM

## 2020-07-05 DIAGNOSIS — IMO0002 Reserved for concepts with insufficient information to code with codable children: Secondary | ICD-10-CM | POA: Insufficient documentation

## 2020-07-05 DIAGNOSIS — I10 Essential (primary) hypertension: Secondary | ICD-10-CM

## 2020-07-05 DIAGNOSIS — F411 Generalized anxiety disorder: Secondary | ICD-10-CM

## 2020-07-05 DIAGNOSIS — S66911A Strain of unspecified muscle, fascia and tendon at wrist and hand level, right hand, initial encounter: Secondary | ICD-10-CM

## 2020-07-05 MED ORDER — MELOXICAM 15 MG PO TABS
15.0000 mg | ORAL_TABLET | Freq: Every day | ORAL | 0 refills | Status: DC
Start: 2020-07-05 — End: 2024-07-23

## 2020-07-05 MED ORDER — SAXENDA 18 MG/3ML ~~LOC~~ SOPN: 2.4000 mg | PEN_INJECTOR | Freq: Every day | SUBCUTANEOUS | 1 refills | Status: DC

## 2020-07-05 MED ORDER — DESVENLAFAXINE SUCCINATE ER 50 MG PO TB24: 50.0000 mg | ORAL_TABLET | Freq: Every day | ORAL | 1 refills | Status: DC

## 2020-07-05 NOTE — Assessment & Plan Note (Signed)
Mild change,  Due to fasting state,  No water on day of labs .  Water intake reviewed,  And > 100 ounces daily.  rtc 2 weeks

## 2020-07-05 NOTE — Assessment & Plan Note (Signed)
Managed with rare  use of alprazolam . The risks and benefits of  Chronic  benzodiazepine use were reviewed with patient today including increased risk of dementia,  Addiction, and seizures if abruptly withdrawn  .

## 2020-07-05 NOTE — Progress Notes (Signed)
Subjective:  Patient ID: Laura [name], female    DOB: 1971-03-30  Age: 49 y.o. MRN: [id number]  CC: The primary encounter diagnosis was Decreased GFR. Diagnoses of Insomnia secondary to anxiety, GAD (generalized anxiety disorder), Fatigue, unspecified type, Obesity (BMI 35.0-39.9 without comorbidity), Essential hypertension, and Strain of right middle finger, initial encounter were also pertinent to this visit.  HPI Laura [name] presents for follow up on hypertension, depression and obesity managed with saxenda  This visit occurred during the SARS-CoV-2 public health emergency.  Safety protocols were in place, including screening questions prior to the visit, additional usage of staff PPE, and extensive cleaning of exam room while observing appropriate contact time as indicated for disinfecting solutions.    Obesity:  She has lost 35 lbs  Since January using Saxenda and following a low glycemic index diet.  Exercising 3 to 4 das per week.  Marland Kitchen    HTN:  Patient is taking her medications as prescribed and notes no adverse effects.  Home BP readings have been done about once per week and are  generally < 130/80 .  She is avoiding added salt in her diet and walking regularly about 3 times per week for exercise  .  GAD:  Managed with Pristiq.  Irritable if she doesn't take it.   New issue:  Sprained her middle finger of right hand 4 weeks ago on dog's leash      Outpatient Medications Prior to Visit  Medication Sig Dispense Refill  . acetaminophen (TYLENOL) 500 MG tablet Take 2 tablets (1,000 mg total) by mouth every 6 (six) hours as needed for mild pain or fever. 30 tablet 0  . ALPRAZolam (XANAX) 0.5 MG tablet TAKE 1 TABLET BY MOUTH AT BEDTIME AS NEEDED FOR ANXIETY 30 tablet 0  . cyanocobalamin (,VITAMIN B-12,) 1000 MCG/ML injection INJECT 1 ML (1,000 MCG TOTAL) INTO THE MUSCLE ONCE A WEEK. 12 mL 5  . esomeprazole (NEXIUM) 20 MG capsule Take 20 mg by mouth daily at 12 noon.    . Insulin  Pen Needle (PEN NEEDLES) 31G X 6 MM MISC For use with victoza /saxenda 30 each 5  . Lactobacillus (PROBIOTIC ACIDOPHILUS PO) Take 1 tablet by mouth daily.    Marland Kitchen levonorgestrel (MIRENA) 20 MCG/24HR IUD 1 each by Intrauterine route once.    Marland Kitchen losartan-hydrochlorothiazide (HYZAAR) 50-12.5 MG tablet TAKE 1 TABLET BY MOUTH EVERY DAY 90 tablet 1  . methocarbamol (ROBAXIN) 750 MG tablet Take 1 tablet (750 mg total) by mouth 4 (four) times daily. 60 tablet 2  . Multiple Vitamin (MULTIVITAMIN) tablet Take 1 tablet by mouth daily.    . Syringe/Needle, Disp, (SYRINGE 3CC/25GX1") 25G X 1" 3 ML MISC Use for b12 injections 50 each 0  . desvenlafaxine (PRISTIQ) 50 MG 24 hr tablet TAKE 1 TABLET BY MOUTH EVERY DAY 90 tablet 1  . SAXENDA 18 MG/3ML SOPN INJECT 0.6 MG INTO THE SKIN DAILY. INCREASE DOSE WEEKLY AS FOLLOWS: WEEK 2: 1.2 MG DAILY WEEK 3: 1.8 MG DAILY WEEK 4: 2.4 MG DAILY 15 pen 2   No facility-administered medications prior to visit.    Review of Systems;  Patient denies headache, fevers, malaise, unintentional weight loss, skin rash, eye pain, sinus congestion and sinus pain, sore throat, dysphagia,  hemoptysis , cough, dyspnea, wheezing, chest pain, palpitations, orthopnea, edema, abdominal pain, nausea, melena, diarrhea, constipation, flank pain, dysuria, hematuria, urinary  Frequency, nocturia, numbness, tingling, seizures,  Focal weakness, Loss of consciousness,  Tremor, insomnia, depression, anxiety,  and suicidal ideation.      Objective:  BP 116/72 (BP Location: Left Arm, Patient Position: Sitting, Cuff Size: Large)   Pulse 76   Temp 98.3 F (36.8 C) (Oral)   Resp 15   Ht 5\' 6"  (1.676 m)   Wt 212 lb (96.2 kg)   SpO2 96%   BMI 34.22 kg/m   BP Readings from Last 3 Encounters:  07/05/20 116/72  11/18/19 137/79  10/28/19 (!) 151/87    Wt Readings from Last 3 Encounters:  07/05/20 212 lb (96.2 kg)  12/08/19 242 lb (109.8 kg)  11/18/19 242 lb (109.8 kg)    General appearance:  alert, cooperative and appears stated age Ears: normal TM's and external ear canals both ears Throat: lips, mucosa, and tongue normal; teeth and gums normal Neck: no adenopathy, no carotid bruit, supple, symmetrical, trachea midline and thyroid not enlarged, symmetric, no tenderness/mass/nodules Back: symmetric, no curvature. ROM normal. No CVA tenderness. Lungs: clear to auscultation bilaterally Heart: regular rate and rhythm, S1, S2 normal, no murmur, click, rub or gallop Abdomen: soft, non-tender; bowel sounds normal; no masses,  no organomegaly Pulses: 2+ and symmetric Ext:  PIP of 3rd finger right hand swollen.   Skin: Skin color, texture, turgor normal. No rashes or lesions Lymph nodes: Cervical, supraclavicular, and axillary nodes normal.  Lab Results  Component Value Date   HGBA1C 5.4 07/01/2020   HGBA1C 5.7 12/17/2019   HGBA1C 5.5 09/18/2018    Lab Results  Component Value Date   CREATININE 1.00 07/01/2020   CREATININE 0.81 12/17/2019   CREATININE 0.86 09/08/2019    Lab Results  Component Value Date   WBC 10.3 11/06/2018   HGB 13.6 11/06/2018   HCT 40.9 11/06/2018   PLT 315 11/06/2018   GLUCOSE 96 07/01/2020   CHOL 154 07/01/2020   TRIG 119.0 07/01/2020   HDL 51.60 07/01/2020   LDLDIRECT 59.0 11/22/2016   LDLCALC 78 07/01/2020   ALT 25 07/01/2020   AST 20 07/01/2020   NA 142 07/01/2020   K 4.7 07/01/2020   CL 105 07/01/2020   CREATININE 1.00 07/01/2020   BUN 21 07/01/2020   CO2 30 07/01/2020   TSH 1.10 12/17/2019   HGBA1C 5.4 07/01/2020   MICROALBUR 0.9 07/01/2020    No results found.  Assessment & Plan:   Problem List Items Addressed This Visit      Unprioritized   Decreased GFR - Primary    Mild change,  Due to fasting state,  No water on day of labs .  Water intake reviewed,  And > 100 ounces daily.  rtc 2 weeks       Relevant Orders   Basic metabolic panel   Essential hypertension    Well controlled on current regimen. Renal function  needs recheck in 2 weeks , no changes today.      Fatigue    Resolved with weight loss of 35 lbs.       GAD (generalized anxiety disorder)    Controlled with Pristiq.  Has side effects if she misses any days       Relevant Medications   desvenlafaxine (PRISTIQ) 50 MG 24 hr tablet   Insomnia secondary to anxiety    Managed with rare  use of alprazolam . The risks and benefits of  Chronic  benzodiazepine use were reviewed with patient today including increased risk of dementia,  Addiction, and seizures if abruptly withdrawn  .        Relevant Medications  desvenlafaxine (PRISTIQ) 50 MG 24 hr tablet   Obesity (BMI 35.0-39.9 without comorbidity)    Weight loss of 35 lbs reported by home scales using Saxenda.   LFTs normal..  No side effects.  continue use.  Follow up 6 month s      Relevant Medications   Liraglutide -Weight Management (SAXENDA) 18 MG/3ML SOPN   Strain of right middle finger    meloxicam  Ice.  Modification of activity         I have changed Laura [name] "Andrea"'s desvenlafaxine and Saxenda. I am also having her start on meloxicam. Additionally, I am having her maintain her levonorgestrel, esomeprazole, SYRINGE 3CC/25GX1", acetaminophen, methocarbamol, cyanocobalamin, multivitamin, Lactobacillus (PROBIOTIC ACIDOPHILUS PO), Pen Needles, ALPRAZolam, and losartan-hydrochlorothiazide.  Meds ordered this encounter  Medications  . desvenlafaxine (PRISTIQ) 50 MG 24 hr tablet    Sig: Take 1 tablet (50 mg total) by mouth daily.    Dispense:  90 tablet    Refill:  1  . Liraglutide -Weight Management (SAXENDA) 18 MG/3ML SOPN    Sig: Inject 0.4 mLs (2.4 mg total) into the skin daily. Inject dose of  2.4 MG DAILY    Dispense:  45 mL    Refill:  1  . meloxicam (MOBIC) 15 MG tablet    Sig: Take 1 tablet (15 mg total) by mouth daily.    Dispense:  30 tablet    Refill:  0    I provided  30 minutes of  face-to-face time during this encounter reviewing patient's current  problems and past surgeries, labs and imaging studies, providing counseling on the above mentioned problems , and coordination  of care . Medications Discontinued During This Encounter  Medication Reason  . desvenlafaxine (PRISTIQ) 50 MG 24 hr tablet Reorder  . SAXENDA 18 MG/3ML SOPN Reorder    Follow-up: Return in about 6 months (around 01/05/2021).   Crecencio Mc, MD

## 2020-07-05 NOTE — Assessment & Plan Note (Signed)
meloxicam  Ice.  Modification of activity

## 2020-07-05 NOTE — Assessment & Plan Note (Signed)
Controlled with Pristiq.  Has side effects if she misses any days

## 2020-07-05 NOTE — Assessment & Plan Note (Signed)
Well controlled on current regimen. Renal function needs recheck in 2 weeks , no changes today.

## 2020-07-05 NOTE — Assessment & Plan Note (Signed)
Resolved with weight loss of 35 lbs.

## 2020-07-05 NOTE — Patient Instructions (Signed)
meloxicam once daily for strain of middle finger   RTC 2 weeks for non fasting bmet

## 2020-07-05 NOTE — Assessment & Plan Note (Signed)
Weight loss of 35 lbs reported by home scales using Saxenda.   LFTs normal..  No side effects.  continue use.  Follow up 6 month s

## 2020-07-16 ENCOUNTER — Other Ambulatory Visit: Payer: Self-pay

## 2020-07-16 ENCOUNTER — Other Ambulatory Visit (INDEPENDENT_AMBULATORY_CARE_PROVIDER_SITE_OTHER): Payer: BC Managed Care – PPO

## 2020-07-16 DIAGNOSIS — R944 Abnormal results of kidney function studies: Secondary | ICD-10-CM

## 2020-07-16 LAB — BASIC METABOLIC PANEL
BUN: 16 mg/dL (ref 6–23)
CO2: 31 mEq/L (ref 19–32)
Calcium: 8.9 mg/dL (ref 8.4–10.5)
Chloride: 103 mEq/L (ref 96–112)
Creatinine, Ser: 1.02 mg/dL (ref 0.40–1.20)
GFR: 57.47 mL/min — ABNORMAL LOW (ref >60.00)
Glucose, Bld: 96 mg/dL (ref 70–99)
Potassium: 4.2 mEq/L (ref 3.5–5.1)
Sodium: 140 mEq/L (ref 135–145)

## 2020-07-20 ENCOUNTER — Other Ambulatory Visit: Payer: Self-pay | Admitting: Internal Medicine

## 2020-07-20 DIAGNOSIS — Z7189 Other specified counseling: Secondary | ICD-10-CM

## 2020-07-20 DIAGNOSIS — R944 Abnormal results of kidney function studies: Secondary | ICD-10-CM

## 2020-08-20 ENCOUNTER — Other Ambulatory Visit: Payer: Self-pay | Admitting: Internal Medicine

## 2020-08-20 DIAGNOSIS — Z1231 Encounter for screening mammogram for malignant neoplasm of breast: Secondary | ICD-10-CM

## 2020-09-13 ENCOUNTER — Other Ambulatory Visit: Payer: Self-pay

## 2020-09-13 ENCOUNTER — Ambulatory Visit
Admission: RE | Admit: 2020-09-13 | Discharge: 2020-09-13 | Disposition: A | Discharge status: Home / Self Care | Payer: BC Managed Care – PPO | Source: Ambulatory Visit | Attending: Internal Medicine | Admitting: Internal Medicine

## 2020-09-13 DIAGNOSIS — Z1231 Encounter for screening mammogram for malignant neoplasm of breast: Secondary | ICD-10-CM | POA: Insufficient documentation

## 2020-12-10 ENCOUNTER — Other Ambulatory Visit: Payer: Self-pay | Admitting: Internal Medicine

## 2021-01-05 ENCOUNTER — Encounter: Payer: Self-pay | Admitting: Internal Medicine

## 2021-01-05 ENCOUNTER — Telehealth (INDEPENDENT_AMBULATORY_CARE_PROVIDER_SITE_OTHER): Payer: Self-pay | Admitting: Internal Medicine

## 2021-01-05 VITALS — Ht 66.0 in | Wt 222.0 lb

## 2021-01-05 DIAGNOSIS — K760 Fatty (change of) liver, not elsewhere classified: Secondary | ICD-10-CM

## 2021-01-05 DIAGNOSIS — R944 Abnormal results of kidney function studies: Secondary | ICD-10-CM

## 2021-01-05 DIAGNOSIS — F419 Anxiety disorder, unspecified: Secondary | ICD-10-CM

## 2021-01-05 DIAGNOSIS — E669 Obesity, unspecified: Secondary | ICD-10-CM

## 2021-01-05 DIAGNOSIS — Z1211 Encounter for screening for malignant neoplasm of colon: Secondary | ICD-10-CM

## 2021-01-05 DIAGNOSIS — F411 Generalized anxiety disorder: Secondary | ICD-10-CM

## 2021-01-05 DIAGNOSIS — F5105 Insomnia due to other mental disorder: Secondary | ICD-10-CM

## 2021-01-05 DIAGNOSIS — E538 Deficiency of other specified B group vitamins: Secondary | ICD-10-CM

## 2021-01-05 NOTE — Assessment & Plan Note (Signed)
Mild change,  Due to fasting state,  Had No water on day of labs .  Water intake reviewed,  And > 100 ounces daily.  rtc asap

## 2021-01-05 NOTE — Patient Instructions (Signed)
Please check your BP 5 times over the 2 weeks and send me the readings  I have ordered fasting labs for you to do at your convenience   GI referral for colonoscopy is in progress

## 2021-01-05 NOTE — Assessment & Plan Note (Signed)
Weight gain reported .  aggravating factors discussed.  Agree with suspending Saxenda until lifestyle issues have been addressed   I have addressed  BMI and recommended a low glycemic index diet utilizing smaller more frequent meals to increase metabolism.  I have also recommended that patient start exercising with a goal of 30 minutes of aerobic exercise a minimum of 5 days per week.

## 2021-01-05 NOTE — Progress Notes (Signed)
Virtual Visit via [place]  This visit type was conducted due to national recommendations for restrictions regarding the COVID-19 pandemic (e.g. social distancing).  This format is felt to be most appropriate for this patient at this time.  All issues noted in this document were discussed and addressed.  No physical exam was performed (except for noted visual exam findings with Video Visits).   I connected with@ on 01/05/21 at  9:00 AM EST by a video enabled telemedicine application  and verified that I am speaking with the correct person using two identifiers. Location patient: home Location provider: work or home office Persons participating in the virtual visit: patient, provider  I discussed the limitations, risks, security and privacy concerns of performing an evaluation and management service by telephone and the availability of in person appointments. I also discussed with the patient that there may be a patient responsible charge related to this service. The patient expressed understanding and agreed to proceed.  Reason for visit: 6 month follow up  HPI:    50 yr old female with obesity,  Fatty liver and GAD presents for 6 month follow up.  Has gained weight over the last 8 weeks due to  Loss of previous routine caused by  recent [employer] which coincided with promotion to head of [employer].  She is addressing the  Change in routine ,  But still indulging due to stress of new position ,  Has Stopped saxenda when she realized it was not keeping her from indulging,  Wt gain of ten lbs  Commutes 45 minutes one way to [place] daily.   Finally adjusting to a 9-5 work schedule.  Rare use of alprazolam prn insomnia  Last refill April 2021   Had covid dec 28. No booster .  Symptoms were mild  URI ,  Lost taste and smell for a week,  All symptoms resolved.  Husband did NOT get COVID and he is UNVACCINATED . She is ambivalent about getting the booster given her recent infection     ROS: See pertinent positives and negatives per HPI.  Past Medical History:  Diagnosis Date  . Allergy   . Dysplasia of cervix   . Elevated hemoglobin ([facility]) 05/29/2017  . Herpes genitalia   . Hypertension    HX of high readings   . Increased BMI   . Menstrual migraine     Past Surgical History:  Procedure Laterality Date  . BREAST BIOPSY Left 10/2018   removal of abscess on the skin-  . DILATION AND CURETTAGE OF UTERUS  2014 FEB and August  . INCISION AND DRAINAGE ABSCESS Left 10/31/2018   Procedure: INCISION AND DRAINAGE LEFT BREAST ABSCESS;  Surgeon: Olean Ree, MD;  Location: [facility];  Service: General;  Laterality: Left;  . IRRIGATION AND DEBRIDEMENT ABSCESS Left 11/05/2018   Procedure: IRRIGATION AND DEBRIDEMENT ABSCESS - BREAST;  Surgeon: Olean Ree, MD;  Location: [facility];  Service: General;  Laterality: Left;  Marland Kitchen MASTOPEXY Bilateral 09/10/2019   Procedure: MASTOPEXY;  Surgeon: Wallace Going, DO;  Location: [facility];  Service: Plastics;  Laterality: Bilateral;  . MOUTH SURGERY    . REDUCTION MAMMAPLASTY Bilateral 08/2019   reducation with lift pt had infection afterwards  . SCAR REVISION Left 09/10/2019   Procedure: left breast scar contracture excision;  Surgeon: Wallace Going, DO;  Location: [facility];  Service: Plastics;  Laterality: Left;  2.5 hours for case, please  . TONSILECTOMY/ADENOIDECTOMY WITH MYRINGOTOMY Bilateral  2011    Family History  Problem Relation Age of Onset  . Arthritis Mother   . Hypertension Mother   . Diabetes Mother   . Heart disease Father   . Hyperlipidemia Father   . Hypertension Father   . Cancer Neg Hx   . Breast cancer Neg Hx     SOCIAL HX:  reports that she has never smoked. She has never used smokeless tobacco. She reports current alcohol use. She reports that she does not use drugs.   Current Outpatient Medications:  .  acetaminophen (TYLENOL) 500 MG tablet, Take 2  tablets (1,000 mg total) by mouth every 6 (six) hours as needed for mild pain or fever., Disp: 30 tablet, Rfl: 0 .  ALPRAZolam (XANAX) 0.5 MG tablet, TAKE 1 TABLET BY MOUTH AT BEDTIME AS NEEDED FOR ANXIETY, Disp: 30 tablet, Rfl: 0 .  cyanocobalamin (,VITAMIN B-12,) 1000 MCG/ML injection, INJECT 1 ML (1,000 MCG TOTAL) INTO THE MUSCLE ONCE A WEEK., Disp: 12 mL, Rfl: 5 .  desvenlafaxine (PRISTIQ) 50 MG 24 hr tablet, Take 1 tablet (50 mg total) by mouth daily., Disp: 90 tablet, Rfl: 1 .  esomeprazole (NEXIUM) 20 MG capsule, Take 20 mg by mouth daily at 12 noon., Disp: , Rfl:  .  Insulin Pen Needle (PEN NEEDLES) 31G X 6 MM MISC, For use with victoza /saxenda, Disp: 30 each, Rfl: 5 .  Lactobacillus (PROBIOTIC ACIDOPHILUS PO), Take 1 tablet by mouth daily., Disp: , Rfl:  .  levonorgestrel (MIRENA) 20 MCG/24HR IUD, 1 each by Intrauterine route once., Disp: , Rfl:  .  Liraglutide -Weight Management (SAXENDA) 18 MG/3ML SOPN, Inject 0.4 mLs (2.4 mg total) into the skin daily. Inject dose of  2.4 MG DAILY, Disp: 45 mL, Rfl: 1 .  losartan-hydrochlorothiazide (HYZAAR) 50-12.5 MG tablet, TAKE 1 TABLET BY MOUTH EVERY DAY, Disp: 90 tablet, Rfl: 1 .  Multiple Vitamin (MULTIVITAMIN) tablet, Take 1 tablet by mouth daily., Disp: , Rfl:  .  Syringe/Needle, Disp, (SYRINGE 3CC/25GX1") 25G X 1" 3 ML MISC, Use for b12 injections, Disp: 50 each, Rfl: 0 .  meloxicam (MOBIC) 15 MG tablet, Take 1 tablet (15 mg total) by mouth daily. (Patient not taking: Reported on 01/05/2021), Disp: 30 tablet, Rfl: 0  EXAM:  VITALS per patient if applicable:  GENERAL: alert, oriented, appears well and in no acute distress  HEENT: atraumatic, conjunttiva clear, no obvious abnormalities on inspection of external nose and ears  NECK: normal movements of the head and neck  LUNGS: on inspection no signs of respiratory distress, breathing rate appears normal, no obvious gross SOB, gasping or wheezing  CV: no obvious cyanosis  MS: moves all  visible extremities without noticeable abnormality  PSYCH/NEURO: pleasant and cooperative, no obvious depression or anxiety, speech and thought processing grossly intact  ASSESSMENT AND PLAN:  Discussed the following assessment and plan:  Colon cancer screening - Plan: Ambulatory referral to General Surgery  B12 deficiency - Plan: Vitamin B12, Intrinsic Factor Antibodies  Hepatic steatosis - Plan: Comprehensive metabolic panel, Lipid panel  Obesity (BMI 35.0-39.9 without comorbidity) - Plan: Hemoglobin A1c  Decreased GFR  Insomnia secondary to anxiety  GAD (generalized anxiety disorder)  B12 deficiency Unclear IF Ab status.  Has not taken injection in months.  Taking an MVI.  Will recheck level with IF Ab screen  Decreased GFR Mild change,  Due to fasting state,  Had No water on day of labs .  Water intake reviewed,  And > 100 ounces daily.  rtc asap  Insomnia secondary to anxiety Improved with change in work schedule.  Rare use  Of alprazolam.  I recommend trying melatonin for insomnia.  Advised that It is not a sedative,  But should be taken on  a regular basis to help your internal clock.  Take every evening after dinner start with 3 mg dose   Max effective dose is 10 mg   GAD (generalized anxiety disorder) Controlled with Pristiq.  Has side effects if she misses any days . Refilling   Hepatic steatosis Hepatic enzymes have been  Normal and need repeating given her weight gain. She has nosigns of cirrhosis or synthetic dysfunction .  continue low glycemic index diet, renew efforts at weight loss with goal BMI < 30 ,   Advised to consider Hep A/B vaccination series   Lab Results  Component Value Date   ALT 25 07/01/2020   AST 20 07/01/2020   ALKPHOS 58 07/01/2020   BILITOT 0.6 07/01/2020     Obesity (BMI 35.0-39.9 without comorbidity) Weight gain reported .  aggravating factors discussed.  Agree with suspending Saxenda until lifestyle issues have been addressed   I  have addressed  BMI and recommended a low glycemic index diet utilizing smaller more frequent meals to increase metabolism.  I have also recommended that patient start exercising with a goal of 30 minutes of aerobic exercise a minimum of 5 days per week.     I discussed the assessment and treatment plan with the patient. The patient was provided an opportunity to ask questions and all were answered. The patient agreed with the plan and demonstrated an understanding of the instructions.   The patient was advised to call back or seek an in-person evaluation if the symptoms worsen or if the condition fails to improve as anticipated.   I spent 30 minutes dedicated to the care of this patient on the date of this encounter to include pre-visit review of his medical history,  Face-to-face time with the patient , and post visit ordering of testing and therapeutics.    Crecencio Mc, MD

## 2021-01-05 NOTE — Assessment & Plan Note (Signed)
Unclear IF Ab status.  Has not taken injection in months.  Taking an MVI.  Will recheck level with IF Ab screen

## 2021-01-05 NOTE — Assessment & Plan Note (Signed)
Controlled with Pristiq.  Has side effects if she misses any days . Refilling

## 2021-01-05 NOTE — Assessment & Plan Note (Signed)
Improved with change in work schedule.  Rare use  Of alprazolam.  I recommend trying melatonin for insomnia.  Advised that It is not a sedative,  But should be taken on  a regular basis to help your internal clock.  Take every evening after dinner start with 3 mg dose   Max effective dose is 10 mg

## 2021-01-05 NOTE — Assessment & Plan Note (Signed)
Hepatic enzymes have been  Normal and need repeating given her weight gain. She has nosigns of cirrhosis or synthetic dysfunction .  continue low glycemic index diet, renew efforts at weight loss with goal BMI < 30 ,   Advised to consider Hep A/B vaccination series   Lab Results  Component Value Date   ALT 25 07/01/2020   AST 20 07/01/2020   ALKPHOS 58 07/01/2020   BILITOT 0.6 07/01/2020

## 2021-02-08 ENCOUNTER — Other Ambulatory Visit: Payer: Self-pay | Admitting: General Surgery

## 2021-02-08 NOTE — Progress Notes (Signed)
Subjective:     Patient ID: Laura [name] is a 50 y.o. female.  HPI  The following portions of the patient's history were reviewed and updated as appropriate.  This a new patient is here today for: office visit. She is here to discuss having a colonoscopy referred by Dr Derrel Nip, none prior. She states her bowels move, usually daily with stool softeners. Denies any bleeding.  Review of Systems  Constitutional: Negative for chills and fever.  Respiratory: Negative for cough.   Gastrointestinal: Negative for blood in stool, constipation and diarrhea.       Chief Complaint  Patient presents with  . New Patient     BP 112/72   Pulse 85   Temp 36.5 C (97.7 F)   Ht 167.6 cm (5\' 6" )   Wt (!) 105.2 kg (232 lb)   SpO2 98%   BMI 37.45 kg/m       Past Medical History:  Diagnosis Date  . Dysplasia of cervix   . Elevated hemoglobin (CMS-HCC) 05/29/2017  . Family history of colonic polyps    Father with multiple tubular adenomas at age 81.  Marland Kitchen Herpes genitalia   . Hypertension   . Increased BMI   . Menstrual migraine           Past Surgical History:  Procedure Laterality Date  . breast scar contracture excision Left 09/10/2019  . dilation and curettage of uterus  2014   x 2  . INCISION AND DRAINAGE BREAST ABSCESS Left 11/05/2018   Dr. Hampton Abbot  . INCISION AND DRAINAGE BREAST ABSCESS Left 10/31/2018   Dr. Hampton Abbot  . MASTOPEXY Bilateral 09/10/2019   Dr. Marla Roe  . mouth surgery    . REDUCTION MAMMAPLASTY Bilateral 08/2019   with lift  . TONSILLECTOMY AND ADENOIDECTOMY Bilateral 2011   with myringotomy              OB History    Gravida  0   Para  0   Term  0   Preterm  0   AB  0   Living  0     SAB  0   IAB  0   Ectopic  0   Molar  0   Multiple  0   Live Births  0       Obstetric Comments  Age at first period 26          Social History          Socioeconomic History  . Marital status: Married     Spouse name: Not on file  . Number of children: Not on file  . Years of education: Not on file  . Highest education level: Not on file  Occupational History  . Not on file  Tobacco Use  . Smoking status: Never Smoker  . Smokeless tobacco: Never Used  Substance and Sexual Activity  . Alcohol use: Yes    Comment: rare  . Drug use: Never  . Sexual activity: Not on file  Other Topics Concern  . Not on file  Social History Narrative  . Not on file   Social Determinants of Health   Financial Resource Strain: Not on file  Food Insecurity: Not on file  Transportation Needs: Not on file           Allergies  Allergen Reactions  . Bee Venom Protein (Honey Bee) Unknown    Current Medications        Current Outpatient Medications  Medication Sig Dispense  Refill  . ALPRAZolam (XANAX) 0.5 MG tablet TAKE 1 TABLET BY MOUTH AT BEDTIME AS NEEDED FOR ANXIETY    . cyanocobalamin (VITAMIN B12) 1,000 mcg/mL injection Inject into the muscle once a week       . desvenlafaxine succinate (PRISTIQ) 50 MG ER tablet Take 50 mg by mouth once daily    . docusate (COLACE) 100 MG capsule Take 100 mg by mouth once daily    . esomeprazole (NEXIUM) 20 MG DR capsule Take by mouth once daily       . levonorgestreL (MIRENA 52 MG) 20 mcg/24 hr (6 years) IUD Insert into the uterus    . liraglutide, weight loss, (SAXENDA) 3 mg/0.5 mL (18 mg/3 mL) pen injector Inject subcutaneously    . losartan-hydrochlorothiazide (HYZAAR) 50-12.5 mg tablet Take 1 tablet by mouth once daily    . meloxicam (MOBIC) 15 MG tablet Take 1 tablet by mouth as needed       . multivitamin (MULTIVITAMIN) tablet Take 1 tablet by mouth once daily    . polyethylene glycol (MIRALAX) powder One bottle for colonoscopy prep. Use as directed. 255 g 0   No current facility-administered medications for this visit.           Family History  Problem Relation Age of Onset  . Diabetes Mother   . High blood  pressure (Hypertension) Mother   . Arthritis Mother   . Colon polyps Mother   . Colon polyps Father   . High blood pressure (Hypertension) Father   . Hyperlipidemia (Elevated cholesterol) Father   . Heart disease Father   . Breast cancer Neg Hx   . Colon cancer Neg Hx          Objective:   Physical Exam Exam conducted with a chaperone present.  Constitutional:      Appearance: Normal appearance.  Cardiovascular:     Rate and Rhythm: Normal rate and regular rhythm.     Pulses: Normal pulses.     Heart sounds: Normal heart sounds.  Pulmonary:     Effort: Pulmonary effort is normal.     Breath sounds: Normal breath sounds.  Musculoskeletal:     Cervical back: Neck supple.  Skin:    General: Skin is warm and dry.  Neurological:     Mental Status: She is alert and oriented to person, place, and time.  Psychiatric:        Mood and Affect: Mood normal.        Behavior: Behavior normal.     Labs and Radiology:   Basic metabolic panel dated July 20, 2020: Sodium 135 - 145 mEq/L 140   Potassium 3.5 - 5.1 mEq/L 4.2   Chloride 96 - 112 mEq/L 103   CO2 19 - 32 mEq/L 31   Glucose, Bld 70 - 99 mg/dL 96   BUN 6 - 23 mg/dL 16   Creatinine, Ser 0.40 - 1.20 mg/dL 1.02   GFR >60.00 mL/min 57.47Low   Calcium 8.4 - 10.5 mg/dL 8.9    Review of the patient's mother and father's pathology reports was completed.  Multiple tubular adenomas for the father, hyperplastic polyps for the mother.    Assessment:     Candidate for screening colonoscopy.    Plan:     Options for colon cancer screening reviewed: Cologuard versus colonoscopy.  In light of her father's multiple polyps in his early 73s, colonoscopy is most likely the better screening procedure.  Risks of colonoscopy were reviewed in detail.  Her husband had significant symptoms of bloating and loose stools for the first 72 hours after his recent exam.  Now resolved.      Entered by Karie Fetch,  RN, acting as a scribe for Dr. Hervey Ard, MD.  The documentation recorded by the scribe accurately reflects the service I personally performed and the decisions made by me.   Robert Bellow, MD FACS

## 2021-02-16 ENCOUNTER — Other Ambulatory Visit
Admission: RE | Admit: 2021-02-16 | Discharge: 2021-02-16 | Disposition: A | Discharge status: Home / Self Care | Payer: BC Managed Care – PPO | Source: Ambulatory Visit | Attending: General Surgery | Admitting: General Surgery

## 2021-02-16 ENCOUNTER — Other Ambulatory Visit: Payer: Self-pay

## 2021-02-16 DIAGNOSIS — Z793 Long term (current) use of hormonal contraceptives: Secondary | ICD-10-CM | POA: Diagnosis not present

## 2021-02-16 DIAGNOSIS — Z1211 Encounter for screening for malignant neoplasm of colon: Secondary | ICD-10-CM | POA: Diagnosis present

## 2021-02-16 DIAGNOSIS — I10 Essential (primary) hypertension: Secondary | ICD-10-CM | POA: Diagnosis not present

## 2021-02-16 DIAGNOSIS — Z791 Long term (current) use of non-steroidal anti-inflammatories (NSAID): Secondary | ICD-10-CM | POA: Diagnosis not present

## 2021-02-16 DIAGNOSIS — Z20822 Contact with and (suspected) exposure to covid-19: Secondary | ICD-10-CM | POA: Insufficient documentation

## 2021-02-16 DIAGNOSIS — Z91030 Bee allergy status: Secondary | ICD-10-CM | POA: Diagnosis not present

## 2021-02-16 DIAGNOSIS — Z01812 Encounter for preprocedural laboratory examination: Secondary | ICD-10-CM | POA: Insufficient documentation

## 2021-02-16 LAB — SARS CORONAVIRUS 2 (TAT 6-24 HRS): SARS Coronavirus 2: NEGATIVE

## 2021-02-18 ENCOUNTER — Encounter
Admission: RE | Disposition: A | Discharge status: Home / Self Care | Payer: Self-pay | Source: Home / Self Care | Attending: General Surgery

## 2021-02-18 ENCOUNTER — Ambulatory Visit: Payer: BC Managed Care – PPO | Admitting: Registered Nurse

## 2021-02-18 ENCOUNTER — Other Ambulatory Visit: Payer: Self-pay

## 2021-02-18 ENCOUNTER — Encounter: Payer: Self-pay | Admitting: General Surgery

## 2021-02-18 ENCOUNTER — Ambulatory Visit
Admission: RE | Admit: 2021-02-18 | Discharge: 2021-02-18 | Disposition: A | Discharge status: Home / Self Care | Payer: BC Managed Care – PPO | Attending: General Surgery | Admitting: General Surgery

## 2021-02-18 DIAGNOSIS — I10 Essential (primary) hypertension: Secondary | ICD-10-CM | POA: Insufficient documentation

## 2021-02-18 DIAGNOSIS — Z20822 Contact with and (suspected) exposure to covid-19: Secondary | ICD-10-CM | POA: Insufficient documentation

## 2021-02-18 DIAGNOSIS — Z793 Long term (current) use of hormonal contraceptives: Secondary | ICD-10-CM | POA: Insufficient documentation

## 2021-02-18 DIAGNOSIS — Z91030 Bee allergy status: Secondary | ICD-10-CM | POA: Insufficient documentation

## 2021-02-18 DIAGNOSIS — Z1211 Encounter for screening for malignant neoplasm of colon: Secondary | ICD-10-CM | POA: Insufficient documentation

## 2021-02-18 DIAGNOSIS — Z791 Long term (current) use of non-steroidal anti-inflammatories (NSAID): Secondary | ICD-10-CM | POA: Insufficient documentation

## 2021-02-18 HISTORY — PX: COLONOSCOPY WITH PROPOFOL: SHX5780

## 2021-02-18 SURGERY — COLONOSCOPY WITH PROPOFOL
Anesthesia: General

## 2021-02-18 MED ORDER — LIDOCAINE HCL (PF) 2 % IJ SOLN
INTRAMUSCULAR | Status: AC
  Filled 2021-02-18: qty 25

## 2021-02-18 MED ORDER — DEXMEDETOMIDINE (PRECEDEX) IN NS 20 MCG/5ML (4 MCG/ML) IV SYRINGE
PREFILLED_SYRINGE | INTRAVENOUS | Status: AC
  Filled 2021-02-18: qty 5

## 2021-02-18 MED ORDER — SODIUM CHLORIDE 0.9 % IV SOLN
INTRAVENOUS | Status: DC
  Administered 2021-02-18: 20 mL/h via INTRAVENOUS

## 2021-02-18 MED ORDER — LIDOCAINE HCL (CARDIAC) PF 100 MG/5ML IV SOSY
PREFILLED_SYRINGE | INTRAVENOUS | Status: DC | PRN
  Administered 2021-02-18: 100 mg via INTRAVENOUS

## 2021-02-18 MED ORDER — PROPOFOL 500 MG/50ML IV EMUL
INTRAVENOUS | Status: DC | PRN
  Administered 2021-02-18: 150 ug/kg/min via INTRAVENOUS

## 2021-02-18 MED ORDER — PROPOFOL 10 MG/ML IV BOLUS
INTRAVENOUS | Status: DC | PRN
  Administered 2021-02-18: 90 mg via INTRAVENOUS

## 2021-02-18 MED ORDER — PROPOFOL 500 MG/50ML IV EMUL
INTRAVENOUS | Status: AC
  Filled 2021-02-18: qty 200

## 2021-02-18 MED ORDER — PHENYLEPHRINE HCL (PRESSORS) 10 MG/ML IV SOLN
INTRAVENOUS | Status: AC
  Filled 2021-02-18: qty 1

## 2021-02-18 NOTE — Transfer of Care (Signed)
Immediate Anesthesia Transfer of Care Note  Patient: Laura [name]  Procedure(s) Performed: COLONOSCOPY WITH PROPOFOL (N/A )  Patient Location: Endoscopy Unit  Anesthesia Type:General  Level of Consciousness: drowsy  Airway & Oxygen Therapy: Patient Spontanous Breathing  Post-op Assessment: Report given to RN and Post -op Vital signs reviewed and stable  Post vital signs: Reviewed and stable  Last Vitals:  Vitals Value Taken Time  BP 96/81 02/18/21 0756  Temp    Pulse 74 02/18/21 0756  Resp 16 02/18/21 0756  SpO2 95 % 02/18/21 0756  Vitals shown include unvalidated device data.  Last Pain:  Vitals:   02/18/21 0640  TempSrc: Temporal  PainSc: 0-No pain         Complications: No complications documented.

## 2021-02-18 NOTE — Op Note (Signed)
[facility] Gastroenterology Patient Name: Laura [name] Procedure Date: 02/18/2021 7:01 AM MRN: [id number] Account #: [id number] Date of Birth: Feb 19, 1971 Admit Type: Outpatient Age: 50 Room: [facility] ENDO ROOM 2 Gender: Female Note Status: Finalized Procedure:             Colonoscopy Indications:           Screening for colorectal malignant neoplasm Providers:             Robert Bellow, MD Referring MD:          Deborra Medina, MD (Referring MD) Medicines:             Propofol per Anesthesia Complications:         No immediate complications. Procedure:             Pre-Anesthesia Assessment:                        - Prior to the procedure, a History and Physical was                         performed, and patient medications, allergies and                         sensitivities were reviewed. The patient's tolerance                         of previous anesthesia was reviewed.                        - The risks and benefits of the procedure and the                         sedation options and risks were discussed with the                         patient. All questions were answered and informed                         consent was obtained.                        After obtaining informed consent, the colonoscope was                         passed under direct vision. Throughout the procedure,                         the patient's blood pressure, pulse, and oxygen                         saturations were monitored continuously. The                         Colonoscope was introduced through the anus and                         advanced to the the cecum, identified by appendiceal                         orifice and ileocecal  valve. The was introduced                         through the and advanced to the. The colonoscopy was                         performed without difficulty. The patient tolerated                         the procedure well. The quality of the bowel                          preparation was good. Findings:      The entire examined colon appeared normal on direct and retroflexion       views. Impression:            - The entire examined colon is normal on direct and                         retroflexion views.                        - No specimens collected. Recommendation:        - Discharge patient to home (via wheelchair). Procedure Code(s):     --- Professional ---                        [phone], Colonoscopy, flexible; diagnostic, including                         collection of specimen(s) by brushing or washing, when                         performed (separate procedure) Diagnosis Code(s):     --- Professional ---                        Z12.11, Encounter for screening for malignant neoplasm                         of colon CPT copyright 2019 American Medical Association. All rights reserved. The codes documented in this report are preliminary and upon coder review may  be revised to meet current compliance requirements. Robert Bellow, MD 02/18/2021 7:53:55 AM This report has been signed electronically. Number of Addenda: 0 Note Initiated On: 02/18/2021 7:01 AM Scope Withdrawal Time: 0 hours 13 minutes 54 seconds  Total Procedure Duration: 0 hours 19 minutes 59 seconds  Estimated Blood Loss:  Estimated blood loss: none.      [facility]

## 2021-02-18 NOTE — Anesthesia Postprocedure Evaluation (Signed)
Anesthesia Post Note  Patient: Laura [name]  Procedure(s) Performed: COLONOSCOPY WITH PROPOFOL (N/A )  Patient location during evaluation: Endoscopy Anesthesia Type: General Level of consciousness: awake and alert and oriented Pain management: pain level controlled Vital Signs Assessment: post-procedure vital signs reviewed and stable Respiratory status: spontaneous breathing, nonlabored ventilation and respiratory function stable Cardiovascular status: blood pressure returned to baseline and stable Postop Assessment: no signs of nausea or vomiting Anesthetic complications: no   No complications documented.   Last Vitals:  Vitals:   02/18/21 0640 02/18/21 0756  BP: (!) 130/99 96/81  Pulse: 76 74  Resp: 20 16  Temp: 36.8 C 36.6 C  SpO2: 99% 95%    Last Pain:  Vitals:   02/18/21 0816  TempSrc:   PainSc: 0-No pain                 Onis Markoff

## 2021-02-18 NOTE — Anesthesia Preprocedure Evaluation (Signed)
Anesthesia Evaluation  Patient identified by MRN, date of birth, ID band Patient awake    Reviewed: Allergy & Precautions, NPO status , Patient's Chart, lab work & pertinent test results  History of Anesthesia Complications Negative for: history of anesthetic complications  Airway Mallampati: II  TM Distance: >3 FB Neck ROM: Full    Dental no notable dental hx.    Pulmonary neg pulmonary ROS, neg sleep apnea, neg COPD,    breath sounds clear to auscultation- rhonchi (-) wheezing      Cardiovascular Exercise Tolerance: Good (-) hypertension(-) CAD, (-) Past MI, (-) Cardiac Stents and (-) CABG  Rhythm:Regular Rate:Normal - Systolic murmurs and - Diastolic murmurs    Neuro/Psych  Headaches, neg Seizures PSYCHIATRIC DISORDERS Anxiety    GI/Hepatic negative GI ROS, Neg liver ROS,   Endo/Other  negative endocrine ROS  Renal/GU negative Renal ROS     Musculoskeletal negative musculoskeletal ROS (+)   Abdominal (+) + obese,   Peds  Hematology negative hematology ROS (+)   Anesthesia Other Findings Past Medical History: No date: Allergy No date: Dysplasia of cervix 05/29/2017: Elevated hemoglobin (HCC) No date: Herpes genitalia No date: Hypertension     Comment:  HX of high readings  No date: Increased BMI No date: Menstrual migraine   Reproductive/Obstetrics                             Anesthesia Physical Anesthesia Plan  ASA: II  Anesthesia Plan: General   Post-op Pain Management:    Induction: Intravenous  PONV Risk Score and Plan: 2 and Propofol infusion  Airway Management Planned: Natural Airway  Additional Equipment:   Intra-op Plan:   Post-operative Plan:   Informed Consent: I have reviewed the patients History and Physical, chart, labs and discussed the procedure including the risks, benefits and alternatives for the proposed anesthesia with the patient or authorized  representative who has indicated his/her understanding and acceptance.     Dental advisory given  Plan Discussed with: CRNA and Anesthesiologist  Anesthesia Plan Comments:         Anesthesia Quick Evaluation

## 2021-02-18 NOTE — H&P (Signed)
Laura [name] [id number] [birth year]     HPI:  Healthy 50 y/o woman for a screening colonoscopy. Tolerated the prep well.   Medications Prior to Admission  Medication Sig Dispense Refill Last Dose  . acetaminophen (TYLENOL) 500 MG tablet Take 2 tablets (1,000 mg total) by mouth every 6 (six) hours as needed for mild pain or fever. 30 tablet 0 Past Week at Unknown time  . ALPRAZolam (XANAX) 0.5 MG tablet TAKE 1 TABLET BY MOUTH AT BEDTIME AS NEEDED FOR ANXIETY 30 tablet 0 02/17/2021 at Unknown time  . cyanocobalamin (,VITAMIN B-12,) 1000 MCG/ML injection INJECT 1 ML (1,000 MCG TOTAL) INTO THE MUSCLE ONCE A WEEK. 12 mL 5 02/17/2021 at Unknown time  . desvenlafaxine (PRISTIQ) 50 MG 24 hr tablet Take 1 tablet (50 mg total) by mouth daily. 90 tablet 1 02/17/2021 at Unknown time  . esomeprazole (NEXIUM) 20 MG capsule Take 20 mg by mouth daily at 12 noon.   02/17/2021 at Unknown time  . Insulin Pen Needle (PEN NEEDLES) 31G X 6 MM MISC For use with victoza /saxenda 30 each 5 02/17/2021 at Unknown time  . Lactobacillus (PROBIOTIC ACIDOPHILUS PO) Take 1 tablet by mouth daily.   02/17/2021 at Unknown time  . levonorgestrel (MIRENA) 20 MCG/24HR IUD 1 each by Intrauterine route once.   02/17/2021 at Unknown time  . Liraglutide -Weight Management (SAXENDA) 18 MG/3ML SOPN Inject 0.4 mLs (2.4 mg total) into the skin daily. Inject dose of  2.4 MG DAILY 45 mL 1 02/17/2021 at Unknown time  . losartan-hydrochlorothiazide (HYZAAR) 50-12.5 MG tablet TAKE 1 TABLET BY MOUTH EVERY DAY 90 tablet 1 02/17/2021 at Unknown time  . Multiple Vitamin (MULTIVITAMIN) tablet Take 1 tablet by mouth daily.   02/17/2021 at Unknown time  . Syringe/Needle, Disp, (SYRINGE 3CC/25GX1") 25G X 1" 3 ML MISC Use for b12 injections 50 each 0 Past Week at Unknown time  . meloxicam (MOBIC) 15 MG tablet Take 1 tablet (15 mg total) by mouth daily. (Patient not taking: Reported on 01/05/2021) 30 tablet 0    Allergies  Allergen Reactions  . Bee Venom     Past Medical History:  Diagnosis Date  . Allergy   . Dysplasia of cervix   . Elevated hemoglobin ([place]) 05/29/2017  . Herpes genitalia   . Hypertension    HX of high readings   . Increased BMI   . Menstrual migraine    Past Surgical History:  Procedure Laterality Date  . BREAST BIOPSY Left 10/2018   removal of abscess on the skin-  . DILATION AND CURETTAGE OF UTERUS  2014 FEB and August  . INCISION AND DRAINAGE ABSCESS Left 10/31/2018   Procedure: INCISION AND DRAINAGE LEFT BREAST ABSCESS;  Surgeon: Olean Ree, MD;  Location: [facility] ORS;  Service: General;  Laterality: Left;  . IRRIGATION AND DEBRIDEMENT ABSCESS Left 11/05/2018   Procedure: IRRIGATION AND DEBRIDEMENT ABSCESS - BREAST;  Surgeon: Olean Ree, MD;  Location: [facility] ORS;  Service: General;  Laterality: Left;  Marland Kitchen MASTOPEXY Bilateral 09/10/2019   Procedure: MASTOPEXY;  Surgeon: Wallace Going, DO;  Location: [place];  Service: Plastics;  Laterality: Bilateral;  . MOUTH SURGERY    . REDUCTION MAMMAPLASTY Bilateral 08/2019   reducation with lift pt had infection afterwards  . SCAR REVISION Left 09/10/2019   Procedure: left breast scar contracture excision;  Surgeon: Wallace Going, DO;  Location: [place];  Service: Plastics;  Laterality: Left;  2.5 hours for case, please  .  TONSILECTOMY/ADENOIDECTOMY WITH MYRINGOTOMY Bilateral 2011   Social History   Socioeconomic History  . Marital status: Married    Spouse name: Not on file  . Number of children: Not on file  . Years of education: Not on file  . Highest education level: Not on file  Occupational History  . Occupation: Solicitor: [employer]  Tobacco Use  . Smoking status: Never Smoker  . Smokeless tobacco: Never Used  Vaping Use  . Vaping Use: Never used  Substance and Sexual Activity  . Alcohol use: Yes    Comment: occasioally  . Drug use: No  . Sexual activity: Yes    Birth  control/protection: I.U.D.  Other Topics Concern  . Not on file  Social History Narrative  . Not on file   Social Determinants of Health   Financial Resource Strain: Not on file  Food Insecurity: Not on file  Transportation Needs: Not on file  Physical Activity: Not on file  Stress: Not on file  Social Connections: Not on file  Intimate Partner Violence: Not on file   Social History   Social History Narrative  . Not on file     ROS: Negative.     PE: HEENT: Negative. Lungs: Clear Cardio: RR.   Assessment/Plan:  Proceed with planned endoscopy.  Forest Gleason [facility] 02/18/2021

## 2021-03-07 ENCOUNTER — Other Ambulatory Visit: Payer: Self-pay | Admitting: Internal Medicine

## 2021-05-03 ENCOUNTER — Other Ambulatory Visit: Payer: Self-pay | Admitting: Internal Medicine

## 2021-05-12 ENCOUNTER — Other Ambulatory Visit: Payer: Self-pay | Admitting: Internal Medicine

## 2021-06-02 ENCOUNTER — Other Ambulatory Visit: Payer: Self-pay | Admitting: Internal Medicine

## 2021-06-02 ENCOUNTER — Telehealth: Payer: Self-pay

## 2021-06-02 NOTE — Telephone Encounter (Signed)
PA for Saxenda has been submitted on covermymeds.  

## 2021-06-03 NOTE — Telephone Encounter (Signed)
RX Refill:xanax Last Seen:01-05-21 Last ordered:02-20-20

## 2021-06-03 NOTE — Telephone Encounter (Signed)
Laura [name] has been denied by insurance due to pt not maintaining 4% body weight loss.   Last OV: 01/05/2021

## 2021-06-06 NOTE — Telephone Encounter (Signed)
Spoke with pt and she stated that she has not been using the [place]. She stated that she got a promotion at work and has been so busy she doesn't remember to use it. Pt is scheduled for lab work and a follow up with Dr. Derrel Nip.

## 2021-06-06 NOTE — Telephone Encounter (Signed)
LMTCB

## 2021-06-06 NOTE — Telephone Encounter (Signed)
Pt returned your call.  

## 2021-06-08 ENCOUNTER — Other Ambulatory Visit: Payer: Self-pay | Admitting: Internal Medicine

## 2021-06-10 ENCOUNTER — Other Ambulatory Visit: Payer: Self-pay

## 2021-06-10 ENCOUNTER — Other Ambulatory Visit (INDEPENDENT_AMBULATORY_CARE_PROVIDER_SITE_OTHER): Payer: BC Managed Care – PPO

## 2021-06-10 DIAGNOSIS — E538 Deficiency of other specified B group vitamins: Secondary | ICD-10-CM | POA: Diagnosis not present

## 2021-06-10 DIAGNOSIS — K760 Fatty (change of) liver, not elsewhere classified: Secondary | ICD-10-CM

## 2021-06-10 DIAGNOSIS — E669 Obesity, unspecified: Secondary | ICD-10-CM | POA: Diagnosis not present

## 2021-06-10 LAB — COMPREHENSIVE METABOLIC PANEL
ALT: 24 U/L (ref 0–35)
AST: 16 U/L (ref 0–37)
Albumin: 3.9 g/dL (ref 3.5–5.2)
Alkaline Phosphatase: 59 U/L (ref 39–117)
BUN: 10 mg/dL (ref 6–23)
CO2: 25 mEq/L (ref 19–32)
Calcium: 9.1 mg/dL (ref 8.4–10.5)
Chloride: 104 mEq/L (ref 96–112)
Creatinine, Ser: 0.88 mg/dL (ref 0.40–1.20)
GFR: 76.61 mL/min (ref >60.00)
Glucose, Bld: 91 mg/dL (ref 70–99)
Potassium: 3.6 mEq/L (ref 3.5–5.1)
Sodium: 142 mEq/L (ref 135–145)
Total Bilirubin: 0.6 mg/dL (ref 0.2–1.2)
Total Protein: 6.8 g/dL (ref 6.0–8.3)

## 2021-06-10 LAB — LIPID PANEL
Cholesterol: 162 mg/dL (ref 0–200)
HDL: 55.9 mg/dL (ref >39.00)
LDL Cholesterol: 86 mg/dL (ref 0–99)
NonHDL: 106.21
Total CHOL/HDL Ratio: 3
Triglycerides: 100 mg/dL (ref 0.0–149.0)
VLDL: 20 mg/dL (ref 0.0–40.0)

## 2021-06-10 LAB — VITAMIN B12: Vitamin B-12: 844 pg/mL (ref 211–911)

## 2021-06-10 LAB — HEMOGLOBIN A1C: Hgb A1c MFr Bld: 5.7 % (ref 4.6–6.5)

## 2021-06-14 LAB — INTRINSIC FACTOR ANTIBODIES: Intrinsic Factor: NEGATIVE

## 2021-07-01 ENCOUNTER — Other Ambulatory Visit: Payer: Self-pay

## 2021-07-01 ENCOUNTER — Encounter: Payer: Self-pay | Admitting: Internal Medicine

## 2021-07-01 ENCOUNTER — Ambulatory Visit: Payer: BC Managed Care – PPO | Admitting: Internal Medicine

## 2021-07-01 DIAGNOSIS — E669 Obesity, unspecified: Secondary | ICD-10-CM

## 2021-07-01 DIAGNOSIS — R944 Abnormal results of kidney function studies: Secondary | ICD-10-CM | POA: Diagnosis not present

## 2021-07-01 DIAGNOSIS — F5105 Insomnia due to other mental disorder: Secondary | ICD-10-CM

## 2021-07-01 DIAGNOSIS — F419 Anxiety disorder, unspecified: Secondary | ICD-10-CM

## 2021-07-01 DIAGNOSIS — R7989 Other specified abnormal findings of blood chemistry: Secondary | ICD-10-CM

## 2021-07-01 MED ORDER — OZEMPIC (0.25 OR 0.5 MG/DOSE) 2 MG/1.5ML ~~LOC~~ SOPN: 0.2500 mg | PEN_INJECTOR | SUBCUTANEOUS | 2 refills | Status: DC

## 2021-07-01 MED ORDER — METFORMIN HCL ER 500 MG PO TB24: 500.0000 mg | ORAL_TABLET | Freq: Every day | ORAL | 1 refills | Status: DC

## 2021-07-01 MED ORDER — ALPRAZOLAM 0.5 MG PO TABS: ORAL_TABLET | ORAL | 5 refills | Status: DC

## 2021-07-01 NOTE — Patient Instructions (Signed)
Metformin and ozempic were both sent to pharmacy  Start  the ozempic samples at 0.25 mg weekly (6 doses available in the sample pen)  Continue 0.25 mg dose weekly for 4 weeks.  (You can continue this dose as long as you are  losing weight ). Increase dose to 0.5 mg  if your weight plateaus  If supply is not available for refill ,  start metformin

## 2021-07-01 NOTE — Progress Notes (Signed)
Subjective:  Patient ID: Laura [name], female    DOB: 1971-06-25  Age: 50 y.o. MRN: [id number]  CC: Diagnoses of Obesity (BMI 35.0-39.9 without comorbidity), Decreased GFR, Prerenal azotemia, and Insomnia secondary to anxiety were pertinent to this visit.  HPI Laura [name] presents for follow up  on obesity management  This visit occurred during the SARS-CoV-2 public health emergency.  Safety protocols were in place, including screening questions prior to the visit, additional usage of staff PPE, and extensive cleaning of exam room while observing appropriate contact time as indicated for disinfecting solutions.    Weight gain of 22 lbs since April  .  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  [place] shortages . [place] counties,  pay is less $$$ than cities.  Working  excess hours.   Using  alprazolam  prn.   On call 24 /7    needs something that she can take in early morning Ambien caused hangover.     Outpatient Medications Prior to Visit  Medication Sig Dispense Refill   acetaminophen (TYLENOL) 500 MG tablet Take 2 tablets (1,000 mg total) by mouth every 6 (six) hours as needed for mild pain or fever. 30 tablet 0   cyanocobalamin (,VITAMIN B-12,) 1000 MCG/ML injection INJECT 1 ML (1,000 MCG TOTAL) INTO THE MUSCLE ONCE A WEEK. 12 mL 5   desvenlafaxine (PRISTIQ) 50 MG 24 hr tablet TAKE 1 TABLET BY MOUTH EVERY DAY 90 tablet 1   esomeprazole (NEXIUM) 20 MG capsule Take 20 mg by mouth daily at 12 noon.     Insulin Pen Needle (B-D UF III MINI PEN NEEDLES) 31G X 5 MM MISC FOR USE WITH VICTOZA /SAXENDA 100 each 5   Lactobacillus (PROBIOTIC ACIDOPHILUS PO) Take 1 tablet by mouth daily.     levonorgestrel (MIRENA) 20 MCG/24HR IUD 1 each by Intrauterine route once.     losartan-hydrochlorothiazide (HYZAAR) 50-12.5 MG tablet TAKE 1 TABLET BY MOUTH EVERY DAY 90 tablet 0   meloxicam (MOBIC) 15 MG tablet Take 1 tablet (15 mg total)  by mouth daily. 30 tablet 0   Multiple Vitamin (MULTIVITAMIN) tablet Take 1 tablet by mouth daily.     Syringe/Needle, Disp, (SYRINGE 3CC/25GX1") 25G X 1" 3 ML MISC Use for b12 injections 50 each 0   ALPRAZolam (XANAX) 0.5 MG tablet TAKE 1 TABLET BY MOUTH AT BEDTIME AS NEEDED FOR ANXIETY 30 tablet 0   SAXENDA 18 MG/3ML SOPN INJECT 0.4 MLS (2.4 MG TOTAL) INTO THE SKIN DAILY. INJECT DOSE OF 2.4 MG DAILY (Patient not taking: Reported on 07/01/2021) 15 mL 1   No facility-administered medications prior to visit.    Review of Systems;  Patient denies headache, fevers, malaise, unintentional weight loss, skin rash, eye pain, sinus congestion and sinus pain, sore throat, dysphagia,  hemoptysis , cough, dyspnea, wheezing, chest pain, palpitations, orthopnea, edema, abdominal pain, nausea, melena, diarrhea, constipation, flank pain, dysuria, hematuria, urinary  Frequency, nocturia, numbness, tingling, seizures,  Focal weakness, Loss of consciousness,  Tremor, insomnia, depression, anxiety, and suicidal ideation.      Objective:  BP (!) 140/92 (BP Location: Left Arm, Patient Position: Sitting, Cuff Size: Large)   Pulse 87   Temp (!) 96.9 F (36.1 C) (Temporal)   Resp 15   Ht '5\' 6"'$  (1.676 m)   Wt 248 lb 8 oz (112.7 kg)   SpO2 96%   BMI 40.11 kg/m   BP Readings  from Last 3 Encounters:  07/01/21 (!) 140/92  02/18/21 96/81  07/05/20 116/72    Wt Readings from Last 3 Encounters:  07/01/21 248 lb 8 oz (112.7 kg)  02/18/21 226 lb (102.5 kg)  01/05/21 222 lb (100.7 kg)    General appearance: alert, cooperative and appears stated age Ears: normal TM's and external ear canals both ears Throat: lips, mucosa, and tongue normal; teeth and gums normal Neck: no adenopathy, no carotid bruit, supple, symmetrical, trachea midline and thyroid not enlarged, symmetric, no tenderness/mass/nodules Back: symmetric, no curvature. ROM normal. No CVA tenderness. Lungs: clear to auscultation bilaterally Heart:  regular rate and rhythm, S1, S2 normal, no murmur, click, rub or gallop Abdomen: soft, non-tender; bowel sounds normal; no masses,  no organomegaly Pulses: 2+ and symmetric Skin: Skin color, texture, turgor normal. No rashes or lesions Lymph nodes: Cervical, supraclavicular, and axillary nodes normal.  Lab Results  Component Value Date   HGBA1C 5.7 06/10/2021   HGBA1C 5.4 07/01/2020   HGBA1C 5.7 12/17/2019    Lab Results  Component Value Date   CREATININE 0.88 06/10/2021   CREATININE 1.02 07/16/2020   CREATININE 1.00 07/01/2020    Lab Results  Component Value Date   WBC 10.3 11/06/2018   HGB 13.6 11/06/2018   HCT 40.9 11/06/2018   PLT 315 11/06/2018   GLUCOSE 91 06/10/2021   CHOL 162 06/10/2021   TRIG 100.0 06/10/2021   HDL 55.90 06/10/2021   LDLDIRECT 59.0 11/22/2016   LDLCALC 86 06/10/2021   ALT 24 06/10/2021   AST 16 06/10/2021   NA 142 06/10/2021   K 3.6 06/10/2021   CL 104 06/10/2021   CREATININE 0.88 06/10/2021   BUN 10 06/10/2021   CO2 25 06/10/2021   TSH 1.10 12/17/2019   HGBA1C 5.7 06/10/2021   MICROALBUR 0.9 07/01/2020    No results found.  Assessment & Plan:   Problem List Items Addressed This Visit       Unprioritized   Obesity (BMI 35.0-39.9 without comorbidity)    Weight gain reported .  aggravating factors discussed.  Starting ozempic , risks and benefits discussed and sample of 0.25 mg dose pen given . Also adding metformin to use if refills on ozempic become unavailable due to "supply chain" issues      Relevant Medications   metFORMIN (GLUCOPHAGE-XR) 500 MG 24 hr tablet   Semaglutide,0.25 or 0.'5MG'$ /DOS, (OZEMPIC, 0.25 OR 0.5 MG/DOSE,) 2 MG/1.5ML SOPN   Insomnia secondary to anxiety    Using alprazolam due to need for short acting medication Advised to add melatonin at dinner.        Relevant Medications   ALPRAZolam (XANAX) 0.5 MG tablet   Prerenal azotemia    Resolved with increased hydration  Lab Results  Component Value Date    CREATININE 0.88 06/10/2021         RESOLVED: Decreased GFR    Mild change,  Due to fasting state,  Now resolved  . Lab Results  Component Value Date   CREATININE 0.88 06/10/2021         Meds ordered this encounter  Medications   metFORMIN (GLUCOPHAGE-XR) 500 MG 24 hr tablet    Sig: Take 1 tablet (500 mg total) by mouth daily with breakfast.    Dispense:  90 tablet    Refill:  1   Semaglutide,0.25 or 0.'5MG'$ /DOS, (OZEMPIC, 0.25 OR 0.5 MG/DOSE,) 2 MG/1.5ML SOPN    Sig: Inject 0.25 mg into the skin once a week.    Dispense:  1.5 mL    Refill:  2   ALPRAZolam (XANAX) 0.5 MG tablet    Sig: TAKE 1 TABLET BY MOUTH AT BEDTIME AS NEEDED FOR ANXIETY    Dispense:  30 tablet    Refill:  5    Medications Discontinued During This Encounter  Medication Reason   ALPRAZolam (XANAX) 0.5 MG tablet Reorder    Follow-up: No follow-ups on file.   Crecencio Mc, MD

## 2021-07-03 DIAGNOSIS — R7989 Other specified abnormal findings of blood chemistry: Secondary | ICD-10-CM | POA: Insufficient documentation

## 2021-07-03 NOTE — Assessment & Plan Note (Addendum)
Weight gain reported .  aggravating factors discussed.  Starting ozempic , risks and benefits discussed and sample of 0.25 mg dose pen given . Also adding metformin to use if refills on ozempic become unavailable due to "supply chain" issues

## 2021-07-03 NOTE — Assessment & Plan Note (Signed)
Using alprazolam due to need for short acting medication Advised to add melatonin at dinner.

## 2021-07-03 NOTE — Assessment & Plan Note (Signed)
Resolved with increased hydration  Lab Results  Component Value Date   CREATININE 0.88 06/10/2021

## 2021-07-03 NOTE — Assessment & Plan Note (Signed)
Mild change,  Due to fasting state,  Now resolved  . Lab Results  Component Value Date   CREATININE 0.88 06/10/2021

## 2021-09-11 ENCOUNTER — Other Ambulatory Visit: Payer: Self-pay | Admitting: Internal Medicine

## 2021-09-21 ENCOUNTER — Other Ambulatory Visit: Payer: Self-pay

## 2021-09-21 ENCOUNTER — Ambulatory Visit
Admission: EM | Admit: 2021-09-21 | Discharge: 2021-09-21 | Disposition: A | Discharge status: Home / Self Care | Payer: BC Managed Care – PPO | Attending: Physician Assistant | Admitting: Physician Assistant

## 2021-09-21 ENCOUNTER — Encounter: Payer: Self-pay | Admitting: Emergency Medicine

## 2021-09-21 DIAGNOSIS — Z793 Long term (current) use of hormonal contraceptives: Secondary | ICD-10-CM | POA: Insufficient documentation

## 2021-09-21 DIAGNOSIS — Z7985 Long-term (current) use of injectable non-insulin antidiabetic drugs: Secondary | ICD-10-CM | POA: Diagnosis not present

## 2021-09-21 DIAGNOSIS — Z20822 Contact with and (suspected) exposure to covid-19: Secondary | ICD-10-CM | POA: Insufficient documentation

## 2021-09-21 DIAGNOSIS — Z79899 Other long term (current) drug therapy: Secondary | ICD-10-CM | POA: Diagnosis not present

## 2021-09-21 DIAGNOSIS — Z791 Long term (current) use of non-steroidal anti-inflammatories (NSAID): Secondary | ICD-10-CM | POA: Diagnosis not present

## 2021-09-21 DIAGNOSIS — R0981 Nasal congestion: Secondary | ICD-10-CM | POA: Insufficient documentation

## 2021-09-21 DIAGNOSIS — Z7984 Long term (current) use of oral hypoglycemic drugs: Secondary | ICD-10-CM | POA: Insufficient documentation

## 2021-09-21 DIAGNOSIS — R509 Fever, unspecified: Secondary | ICD-10-CM | POA: Diagnosis present

## 2021-09-21 DIAGNOSIS — B349 Viral infection, unspecified: Secondary | ICD-10-CM

## 2021-09-21 DIAGNOSIS — R051 Acute cough: Secondary | ICD-10-CM

## 2021-09-21 DIAGNOSIS — R519 Headache, unspecified: Secondary | ICD-10-CM | POA: Insufficient documentation

## 2021-09-21 LAB — RAPID INFLUENZA A&B ANTIGENS
Influenza A (ARMC): NEGATIVE
Influenza B (ARMC): NEGATIVE

## 2021-09-21 MED ORDER — PSEUDOEPH-BROMPHEN-DM 30-2-10 MG/5ML PO SYRP: 10.0000 mL | ORAL_SOLUTION | Freq: Four times a day (QID) | ORAL | 0 refills | Status: AC | PRN

## 2021-09-21 NOTE — Discharge Instructions (Addendum)

## 2021-09-21 NOTE — ED Triage Notes (Signed)
Pt presents today with c/o of cough, body aches and fever x 2 days. No home Covid tests taken. Dayquil OTC taken at 10:00 am today.

## 2021-09-21 NOTE — ED Provider Notes (Signed)
[facility]    CSN: [id number] Arrival date & time: 09/21/21  1156      History   Chief Complaint Chief Complaint  Patient presents with   Cough   Generalized Body Aches   Fever    HPI Laura [name] is a 50 y.o. female presenting with fever up to 101 degrees, body aches, fatigue, headaches, cough and congestion since yesterday.  Patient says her husband has a mild cough but has not had a fever.  She denies any known flu or COVID exposure.  She has been taking over-the-counter DayQuil.  Temp currently 100.8 degrees.  Does not report sore throat, chest pain, breathing difficulty, vomiting or diarrhea.  Patient says she feels awful and just wants to go home and sleep.  No other complaints.  HPI  Past Medical History:  Diagnosis Date   Allergy    Dysplasia of cervix    Elevated hemoglobin ([place]) 05/29/2017   Herpes genitalia    Hypertension    HX of high readings    Increased BMI    Menstrual migraine     Patient Active Problem List   Diagnosis Date Noted   Prerenal azotemia 07/03/2021   Strain of right middle finger 07/05/2020   Encounter for preventive health examination 12/08/2019   Open wound of right breast 09/30/2019   Postoperative breast asymmetry 07/11/2019   Pyriformis syndrome, right 03/23/2019   Educated about COVID-19 virus infection 03/23/2019   Open wound of left breast 12/17/2018   Hospital discharge follow-up 11/19/2018   Abscess of breast, left 10/31/2018   H/O oral aphthous ulcers 06/11/2018   Hepatic steatosis 01/21/2018   GAD (generalized anxiety disorder) 12/29/2017   Fatigue 11/25/2016   IUD contraception 11/25/2016   Low grade squamous intraepithelial lesion (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 smear 11/25/2016   B12 deficiency 11/23/2016   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   Essential hypertension 01/03/2015   Long-term use of high-risk medication 09/09/2014   Obesity (BMI 35.0-39.9 without comorbidity) 02/21/2014    Past Surgical  History:  Procedure Laterality Date   BREAST BIOPSY Left 10/2018   removal of abscess on the skin-   COLONOSCOPY WITH PROPOFOL N/A 02/18/2021   Procedure: COLONOSCOPY WITH PROPOFOL;  Surgeon: Robert Bellow, MD;  Location: [facility];  Service: Endoscopy;  Laterality: N/A;   DILATION AND CURETTAGE OF UTERUS  2014 FEB and August   INCISION AND DRAINAGE ABSCESS Left 10/31/2018   Procedure: INCISION AND DRAINAGE LEFT BREAST ABSCESS;  Surgeon: Olean Ree, MD;  Location: [facility];  Service: General;  Laterality: Left;   IRRIGATION AND DEBRIDEMENT ABSCESS Left 11/05/2018   Procedure: IRRIGATION AND DEBRIDEMENT ABSCESS - BREAST;  Surgeon: Olean Ree, MD;  Location: [facility];  Service: General;  Laterality: Left;   MASTOPEXY Bilateral 09/10/2019   Procedure: MASTOPEXY;  Surgeon: Wallace Going, DO;  Location: [place];  Service: Plastics;  Laterality: Bilateral;   MOUTH SURGERY     REDUCTION MAMMAPLASTY Bilateral 08/2019   reducation with lift pt had infection afterwards   SCAR REVISION Left 09/10/2019   Procedure: left breast scar contracture excision;  Surgeon: Wallace Going, DO;  Location: [place];  Service: Plastics;  Laterality: Left;  2.5 hours for case, please   TONSILECTOMY/ADENOIDECTOMY WITH MYRINGOTOMY Bilateral 2011    OB History     Gravida  2   Para      Term      Preterm      AB  2   Living         SAB  0   IAB      Ectopic      Multiple      Live Births               Home Medications    Prior to Admission medications   Medication Sig Start Date End Date Taking? Authorizing Provider  brompheniramine-pseudoephedrine-DM 30-2-10 MG/5ML syrup Take 10 mLs by mouth 4 (four) times daily as needed for up to 7 days. 09/21/21 09/28/21 Yes Danton Clap, PA-C  acetaminophen (TYLENOL) 500 MG tablet Take 2 tablets (1,000 mg total) by mouth every 6 (six) hours as needed for mild pain or fever. 11/08/18    Olean Ree, MD  ALPRAZolam Duanne Moron) 0.5 MG tablet TAKE 1 TABLET BY MOUTH AT BEDTIME AS NEEDED FOR ANXIETY 07/03/21   Crecencio Mc, MD  cyanocobalamin (,VITAMIN B-12,) 1000 MCG/ML injection INJECT 1 ML (1,000 MCG TOTAL) INTO THE MUSCLE ONCE A WEEK. 08/25/19   Crecencio Mc, MD  desvenlafaxine (PRISTIQ) 50 MG 24 hr tablet TAKE 1 TABLET BY MOUTH EVERY DAY 09/12/21   Crecencio Mc, MD  esomeprazole (NEXIUM) 20 MG capsule Take 20 mg by mouth daily at 12 noon.    [provider]  Insulin Pen Needle (B-D UF III MINI PEN NEEDLES) 31G X 5 MM MISC FOR USE WITH VICTOZA Kirke Shaggy 05/12/21   Crecencio Mc, MD  Lactobacillus (PROBIOTIC ACIDOPHILUS PO) Take 1 tablet by mouth daily.    [provider]  levonorgestrel (MIRENA) 20 MCG/24HR IUD 1 each by Intrauterine route once.    [provider]  losartan-hydrochlorothiazide (HYZAAR) 50-12.5 MG tablet TAKE 1 TABLET BY MOUTH EVERY DAY 09/12/21   Crecencio Mc, MD  meloxicam (MOBIC) 15 MG tablet Take 1 tablet (15 mg total) by mouth daily. 07/05/20   Crecencio Mc, MD  metFORMIN (GLUCOPHAGE-XR) 500 MG 24 hr tablet Take 1 tablet (500 mg total) by mouth daily with breakfast. 07/01/21   Crecencio Mc, MD  Multiple Vitamin (MULTIVITAMIN) tablet Take 1 tablet by mouth daily.    [provider]  SAXENDA 18 MG/3ML SOPN INJECT 0.4 MLS (2.4 MG TOTAL) INTO THE SKIN DAILY. INJECT DOSE OF 2.4 MG DAILY Patient not taking: Reported on 07/01/2021 05/03/21   Crecencio Mc, MD  Semaglutide,0.25 or 0.5MG /DOS, (OZEMPIC, 0.25 OR 0.5 MG/DOSE,) 2 MG/1.5ML SOPN Inject 0.25 mg into the skin once a week. 07/01/21   Crecencio Mc, MD  Syringe/Needle, Disp, (SYRINGE 3CC/25GX1") 25G X 1" 3 ML MISC Use for b12 injections 06/11/18   Crecencio Mc, MD    Family History Family History  Problem Relation Age of Onset   Arthritis Mother    Hypertension Mother    Diabetes Mother    Heart disease Father    Hyperlipidemia Father    Hypertension  Father    Cancer Neg Hx    Breast cancer Neg Hx     Social History Social History   Tobacco Use   Smoking status: Never   Smokeless tobacco: Never  Vaping Use   Vaping Use: Never used  Substance Use Topics   Alcohol use: Yes    Comment: occasioally   Drug use: No     Allergies   Bee venom   Review of Systems Review of Systems  Constitutional:  Positive for fatigue and fever. Negative for chills and diaphoresis.  HENT:  Positive for congestion. Negative for  ear pain, rhinorrhea, sinus pressure, sinus pain and sore throat.   Respiratory:  Positive for cough. Negative for shortness of breath.   Gastrointestinal:  Negative for abdominal pain, nausea and vomiting.  Musculoskeletal:  Positive for myalgias. Negative for arthralgias.  Skin:  Negative for rash.  Neurological:  Positive for headaches. Negative for weakness.  Hematological:  Negative for adenopathy.    Physical Exam Triage Vital Signs ED Triage Vitals  Enc Vitals Group     BP      Pulse      Resp      Temp      Temp src      SpO2      Weight      Height      Head Circumference      Peak Flow      Pain Score      Pain Loc      Pain Edu?      Excl. in [place]?    No data found.  Updated Vital Signs BP (!) 155/81 (BP Location: Right Arm)   Pulse (!) 106   Temp (!) 100.9 F (38.3 C) (Oral)   Resp 20   SpO2 100%      Physical Exam Vitals and nursing note reviewed.  Constitutional:      General: She is not in acute distress.    Appearance: Normal appearance. She is ill-appearing. She is not toxic-appearing.  HENT:     Head: Normocephalic and atraumatic.     Nose: Congestion present.     Mouth/Throat:     Mouth: Mucous membranes are moist.     Pharynx: Oropharynx is clear.  Eyes:     General: No scleral icterus.       Right eye: No discharge.        Left eye: No discharge.     Conjunctiva/sclera: Conjunctivae normal.  Cardiovascular:     Rate and Rhythm: Regular rhythm. Tachycardia present.      Heart sounds: Normal heart sounds.  Pulmonary:     Effort: Pulmonary effort is normal. No respiratory distress.     Breath sounds: Normal breath sounds.  Musculoskeletal:     Cervical back: Neck supple.  Skin:    General: Skin is dry.  Neurological:     General: No focal deficit present.     Mental Status: She is alert. Mental status is at baseline.     Motor: No weakness.     Gait: Gait normal.  Psychiatric:        Mood and Affect: Mood normal.        Behavior: Behavior normal.        Thought Content: Thought content normal.     UC Treatments / Results  Labs (all labs ordered are listed, but only abnormal results are displayed) Labs Reviewed  RAPID INFLUENZA A&B ANTIGENS  SARS CORONAVIRUS 2 (TAT 6-24 HRS)    EKG   Radiology No results found.  Procedures Procedures (including critical care time)  Medications Ordered in UC Medications - No data to display  Initial Impression / Assessment and Plan / UC Course  I have reviewed the triage vital signs and the nursing notes.  Pertinent labs & imaging results that were available during my care of the patient were reviewed by me and considered in my medical decision making (see chart for details).   50 year old female presenting for onset of fever, fatigue, body aches, cough and congestion yesterday.  Temp is currently 100.8  degrees and she is ill-appearing.  She is nontoxic.  Exam significant for nasal congestion and tachycardia.  Rapid flu is negative.  PCR COVID performed.  Current CDC guidelines, isolation protocol and ED precautions reviewed if COVID-positive.  Patient may be a candidate for antiviral therapy if COVID-positive given her history of hypertension.  Suspect this could also be a false negative on the flu test or other flulike viral illness.  Supportive care encouraged with continuing the antipyretics and decongestants.  Have sent Bromfed-DM for her to try.  Work note given.  Return and ED precautions  reviewed.  Final Clinical Impressions(s) / UC Diagnoses   Final diagnoses:  Viral illness  Acute cough  Fever, unspecified     Discharge Instructions      URI/COLD SYMPTOMS: Your exam today is consistent with a viral illness. Antibiotics are not indicated at this time. Use medications as directed, including cough syrup, nasal saline, and decongestants. Your symptoms should improve over the next few days and resolve within 7-10 days. Increase rest and fluids. F/u if symptoms worsen or predominate such as sore throat, ear pain, productive cough, shortness of breath, or if you develop high fevers or worsening fatigue over the next several days.    You have received COVID testing today either for positive exposure, concerning symptoms that could be related to COVID infection, screening purposes, or re-testing after confirmed positive.  Your test obtained today checks for active viral infection in the last 1-2 weeks. If your test is negative now, you can still test positive later. So, if you do develop symptoms you should either get re-tested and/or isolate x 5 days and then strict mask use x 5 days (unvaccinated) or mask use x 10 days (vaccinated). Please follow CDC guidelines.  While Rapid antigen tests come back in 15-20 minutes, send out PCR/molecular test results typically come back within 1-3 days. In the mean time, if you are symptomatic, assume this could be a positive test and treat/monitor yourself as if you do have COVID.   We will call with test results if positive. Please download the MyChart app and set up a profile to access test results.   If symptomatic, go home and rest. Push fluids. Take Tylenol as needed for discomfort. Gargle warm salt water. Throat lozenges. Take Mucinex DM or Robitussin for cough. Humidifier in bedroom to ease coughing. Warm showers. Also review the COVID handout for more information.  COVID-19 INFECTION: The incubation period of COVID-19 is approximately  14 days after exposure, with most symptoms developing in roughly 4-5 days. Symptoms may range in severity from mild to critically severe. Roughly 80% of those infected will have mild symptoms. People of any age may become infected with COVID-19 and have the ability to transmit the virus. The most common symptoms include: fever, fatigue, cough, body aches, headaches, sore throat, nasal congestion, shortness of breath, nausea, vomiting, diarrhea, changes in smell and/or taste.    COURSE OF ILLNESS Some patients may begin with mild disease which can progress quickly into critical symptoms. If your symptoms are worsening please call ahead to the Emergency Department and proceed there for further treatment. Recovery time appears to be roughly 1-2 weeks for mild symptoms and 3-6 weeks for severe disease.   GO IMMEDIATELY TO ER FOR FEVER YOU ARE UNABLE TO GET DOWN WITH TYLENOL, BREATHING PROBLEMS, CHEST PAIN, FATIGUE, LETHARGY, INABILITY TO EAT OR DRINK, ETC  QUARANTINE AND ISOLATION: To help decrease the spread of COVID-19 please remain isolated if you  have COVID infection or are highly suspected to have COVID infection. This means -stay home and isolate to one room in the home if you live with others. Do not share a bed or bathroom with others while ill, sanitize and wipe down all countertops and keep common areas clean and disinfected. Stay home for 5 days. If you have no symptoms or your symptoms are resolving after 5 days, you can leave your house. Continue to wear a mask around others for 5 additional days. If you have been in close contact (within 6 feet) of someone diagnosed with COVID 19, you are advised to quarantine in your home for 14 days as symptoms can develop anywhere from 2-14 days after exposure to the virus. If you develop symptoms, you  must isolate.  Most current guidelines for COVID after exposure -unvaccinated: isolate 5 days and strict mask use x 5 days. Test on day 5 is  possible -vaccinated: wear mask x 10 days if symptoms do not develop -You do not necessarily need to be tested for COVID if you have + exposure and  develop symptoms. Just isolate at home x10 days from symptom onset During this global pandemic, CDC advises to practice social distancing, try to stay at least 45ft away from others at all times. Wear a face covering. Wash and sanitize your hands regularly and avoid going anywhere that is not necessary.  KEEP IN MIND THAT THE COVID TEST IS NOT 100% ACCURATE AND YOU SHOULD STILL DO EVERYTHING TO PREVENT POTENTIAL SPREAD OF VIRUS TO OTHERS (WEAR MASK, WEAR GLOVES, [place] HANDS AND SANITIZE REGULARLY). IF INITIAL TEST IS NEGATIVE, THIS MAY NOT MEAN YOU ARE DEFINITELY NEGATIVE. MOST ACCURATE TESTING IS DONE 5-7 DAYS AFTER EXPOSURE.   It is not advised by CDC to get re-tested after receiving a positive COVID test since you can still test positive for weeks to months after you have already cleared the virus.   *If you have not been vaccinated for COVID, I strongly suggest you consider getting vaccinated as long as there are no contraindications.       ED Prescriptions     Medication Sig Dispense Auth. Provider   brompheniramine-pseudoephedrine-DM 30-2-10 MG/5ML syrup Take 10 mLs by mouth 4 (four) times daily as needed for up to 7 days. 150 mL Danton Clap, PA-C      PDMP not reviewed this encounter.   Danton Clap, PA-C 09/21/21 1359

## 2021-09-22 LAB — SARS CORONAVIRUS 2 (TAT 6-24 HRS): SARS Coronavirus 2: NEGATIVE

## 2021-10-06 ENCOUNTER — Other Ambulatory Visit: Payer: Self-pay

## 2021-10-06 ENCOUNTER — Ambulatory Visit: Payer: BC Managed Care – PPO | Admitting: Dermatology

## 2021-10-06 DIAGNOSIS — L0291 Cutaneous abscess, unspecified: Secondary | ICD-10-CM

## 2021-10-06 MED ORDER — DOXYCYCLINE MONOHYDRATE 100 MG PO CAPS: 100.0000 mg | ORAL_CAPSULE | Freq: Two times a day (BID) | ORAL | 0 refills | Status: AC

## 2021-10-06 NOTE — Patient Instructions (Addendum)
Doxycycline should be taken with food to prevent nausea. Do not lay down for 30 minutes after taking. Be cautious with sun exposure and use good sun protection while on this medication. Pregnant women should not take this medication.   Use mupirocin on affected area daily and cover until healed.           If You Need Anything After Your Visit  If you have any questions or concerns for your doctor, please call our main line at [phone] and press option 4 to reach your doctor's medical assistant. If no one answers, please leave a voicemail as directed and we will return your call as soon as possible. Messages left after 4 pm will be answered the following business day.   You may also send [place] a message via [place]. We typically respond to MyChart messages within 1-2 business days.  For prescription refills, please ask your pharmacy to contact our office. Our fax number is [phone].  If you have an urgent issue when the clinic is closed that cannot wait until the next business day, you can page your doctor at the number below.    Please note that while we do our best to be available for urgent issues outside of office hours, we are not available 24/7.   If you have an urgent issue and are unable to reach [place], you may choose to seek medical care at your doctor's office, retail clinic, urgent care center, or emergency room.  If you have a medical emergency, please immediately call 911 or go to the emergency department.  Pager Numbers  - Dr. Nehemiah Massed: [phone]  - Dr. Laurence Ferrari: [phone]  - Dr. Nicole Kindred: [phone]  In the event of inclement weather, please call our main line at [phone] for an update on the status of any delays or closures.  Dermatology Medication Tips: Please keep the boxes that topical medications come in in order to help keep track of the instructions about where and how to use these. Pharmacies typically print the medication instructions only on the  boxes and not directly on the medication tubes.   If your medication is too expensive, please contact our office at [phone] option 4 or send [place] a message through [place].   We are unable to tell what your co-pay for medications will be in advance as this is different depending on your insurance coverage. However, we may be able to find a substitute medication at lower cost or fill out paperwork to get insurance to cover a needed medication.   If a prior authorization is required to get your medication covered by your insurance company, please allow [place] 1-2 business days to complete this process.  Drug prices often vary depending on where the prescription is filled and some pharmacies may offer cheaper prices.  The website www.goodrx.com contains coupons for medications through different pharmacies. The prices here do not account for what the cost may be with help from insurance (it may be cheaper with your insurance), but the website can give you the price if you did not use any insurance.  - You can print the associated coupon and take it with your prescription to the pharmacy.  - You may also stop by our office during regular business hours and pick up a GoodRx coupon card.  - If you need your prescription sent electronically to a different pharmacy, notify our office through [facility] or by phone at [phone] option 4.     Si [place]  Algo Despus de Su Visita  Tambin puede enviarnos un mensaje a travs de MyChart. Por lo general respondemos a los mensajes de MyChart en el transcurso de 1 a 2 das hbiles.  Para renovar recetas, por favor pida a su farmacia que se ponga en contacto con nuestra oficina. Harland Dingwall de fax es [place] [phone].  Si tiene un asunto urgente cuando la clnica est cerrada y que no puede esperar hasta el siguiente da hbil, puede llamar/localizar a su doctor(a) al nmero que aparece a continuacin.   Por favor, tenga en cuenta que  aunque hacemos todo lo posible para estar disponibles para asuntos urgentes fuera del horario de [place], no estamos disponibles las 24 horas del da, los 7 das de la [place].   Si tiene un problema urgente y no puede comunicarse con nosotros, puede optar por buscar atencin mdica  en el consultorio de su doctor(a), en una clnica privada, en un centro de atencin urgente o en una sala de emergencias.  Si tiene Engineering geologist, por favor llame inmediatamente al 911 o vaya a la sala de emergencias.  Nmeros de bper  - Dr. Nehemiah Massed: [phone]  - Dra. Moye: [phone]  - Dra. Nicole Kindred: [phone]  En caso de inclemencias del [place], por favor llame a Johnsie Kindred principal al [phone] para una actualizacin sobre el [place] de cualquier retraso o cierre.  Consejos para la medicacin en dermatologa: Por favor, guarde las cajas en las que vienen los medicamentos de uso tpico para ayudarle a seguir las instrucciones sobre dnde y cmo usarlos. Las farmacias generalmente imprimen las instrucciones del medicamento slo en las cajas y no directamente en los tubos del [place].   Si su medicamento es muy caro, por favor, pngase en contacto con Zigmund Daniel llamando al [phone] y presione la opcin 4 o envenos un mensaje a travs de Pharmacist, community.   No podemos decirle cul ser su copago por los medicamentos por adelantado ya que esto es diferente dependiendo de la cobertura de su seguro. Sin embargo, es posible que podamos encontrar un medicamento sustituto a Electrical engineer un formulario para que el seguro cubra el medicamento que se considera necesario.   Si se requiere una autorizacin previa para que su compaa de seguros [place] su medicamento, por favor permtanos de 1 a 2 das hbiles para completar este proceso.  Los precios de los medicamentos varan con frecuencia dependiendo del Environmental consultant de dnde se surte la receta y alguna farmacias pueden ofrecer precios ms  baratos.  El sitio web www.goodrx.com tiene cupones para medicamentos de Airline pilot. Los precios aqu no tienen en cuenta lo que podra costar con la ayuda del seguro (puede ser ms barato con su seguro), pero el sitio web puede darle el precio si no utiliz Research scientist (physical sciences).  - Puede imprimir el cupn correspondiente y llevarlo con su receta a la farmacia.  - Tambin puede pasar por nuestra oficina durante el horario de atencin regular y Charity fundraiser una tarjeta de cupones de GoodRx.  - Si necesita que su receta se enve electrnicamente a una farmacia diferente, informe a nuestra oficina a travs de MyChart de [place] o por telfono llamando al [phone] y presione la opcin 4.

## 2021-10-06 NOTE — Progress Notes (Signed)
   Follow-Up Visit   Subjective  Laura [name] is a 50 y.o. female who presents for the following: Follow-up (Patient reports a spot at mid chest that come up in past week. She reports that it is warm to touch and painful. She would just like to have checked. Patient reports she has been using mupirocin this morning. Patient reports that she has a history of infection at left breast. ).  The following portions of the chart were reviewed this encounter and updated as appropriate:  Tobacco  Allergies  Meds  Problems  Med Hx  Surg Hx  Fam Hx     Review of Systems: No other skin or systemic complaints except as noted in HPI or Assessment and Plan.  Objective  Well appearing patient in no apparent distress; mood and affect are within normal limits.  A focused examination was performed including mid chest . Relevant physical exam findings are noted in the Assessment and Plan.  right medial breast 3 cm firm nodule   Assessment & Plan  Abscess right medial breast  Recommend culture and I&D   Doxycycline 1 pill twice day for 10 days with food 100 mg Mupirocin patient has already - use once daily to affected area  Pending Anaerobic and Aerobic Culture sent to [facility]  doxycycline (MONODOX) 100 MG capsule - right medial breast Take 1 capsule (100 mg total) by mouth 2 (two) times daily for 10 days. Take with food  Anaerobic and Aerobic Culture - right medial breast  Incision and Drainage - right medial breast -complicated Location: right medial breast  Informed Consent: Discussed risks (permanent scarring, light or dark discoloration, infection, pain, bleeding, bruising, redness, damage to adjacent structures, and recurrence of the lesion) and benefits of the procedure, as well as the alternatives.  Informed consent was obtained.  Preparation: The area was prepped with alcohol.  Anesthesia: Lidocaine 1% with epinephrine  Procedure Details: An incision was made overlying the  lesion. The lesion drained pus and blood.  A large amount of fluid was drained.    Antibiotic ointment and a sterile pressure dressing were applied. The patient tolerated procedure well.  Total number of lesions drained: 1  Plan: The patient was instructed on post-op care. Recommend OTC analgesia as needed for pain.  Return if symptoms worsen or fail to improve, for 4 month follow up on abscess . IRuthell Rummage, CMA, am acting as scribe for Sarina Ser, MD. Documentation: I have reviewed the above documentation for accuracy and completeness, and I agree with the above.  Sarina Ser, MD

## 2021-10-14 LAB — ANAEROBIC AND AEROBIC CULTURE

## 2021-10-21 ENCOUNTER — Telehealth: Payer: Self-pay

## 2021-10-21 ENCOUNTER — Encounter: Payer: Self-pay | Admitting: Dermatology

## 2021-10-21 NOTE — Telephone Encounter (Signed)
Patient informed of culture results and states that the cyst is healing well. Advised patient that surgery can be performed to remove the cyst to keep it from recurring.

## 2021-10-21 NOTE — Telephone Encounter (Signed)
-----   Message from Ralene Bathe, MD sent at 10/18/2021  2:46 PM EST ----- No abnormal bacterial growth from Abscess culture 10/06/21. How is pt doing. No further treatment unless further problem. May need excision if cyst evident in future.

## 2021-12-02 ENCOUNTER — Telehealth: Payer: BC Managed Care – PPO | Admitting: Family

## 2021-12-02 DIAGNOSIS — U071 COVID-19: Secondary | ICD-10-CM | POA: Diagnosis not present

## 2021-12-02 MED ORDER — MOLNUPIRAVIR EUA 200MG CAPSULE: 4.0000 | ORAL_CAPSULE | Freq: Two times a day (BID) | ORAL | 0 refills | Status: AC

## 2021-12-02 MED ORDER — BENZONATATE 100 MG PO CAPS: 100.0000 mg | ORAL_CAPSULE | Freq: Three times a day (TID) | ORAL | 0 refills | Status: DC | PRN

## 2021-12-02 NOTE — Progress Notes (Signed)
Virtual Visit Consent   Laura [name], you are scheduled for a virtual visit with a [facility] provider today.     Just as with appointments in the office, your consent must be obtained to participate.  Your consent will be active for this visit and any virtual visit you may have with one of our providers in the next 365 days.     If you have a MyChart account, a copy of this consent can be sent to you electronically.  All virtual visits are billed to your insurance company just like a traditional visit in the office.    As this is a virtual visit, video technology does not allow for your provider to perform a traditional examination.  This may limit your provider's ability to fully assess your condition.  If your provider identifies any concerns that need to be evaluated in person or the need to arrange testing (such as labs, EKG, etc.), we will make arrangements to do so.     Although advances in technology are sophisticated, we cannot ensure that it will always work on either your end or our end.  If the connection with a video visit is poor, the visit may have to be switched to a telephone visit.  With either a video or telephone visit, we are not always able to ensure that we have a secure connection.     I need to obtain your verbal consent now.   Are you willing to proceed with your visit today?    Laura [name] has provided verbal consent on 12/02/2021 for a virtual visit (video or telephone).   Evelina Dun, FNP   Date: 12/02/2021 10:10 AM   Virtual Visit via Video Note   I, Evelina Dun, connected with  Laura [name]  ([id number], 1970-12-11) on 12/02/21 at 10:15 AM EST by a video-enabled telemedicine application and verified that I am speaking with the correct person using two identifiers.  Location: Patient: Virtual Visit Location Patient: Home Provider: Virtual Visit Location Provider: Home Office   I discussed the limitations of evaluation and management by  telemedicine and the availability of in person appointments. The patient expressed understanding and agreed to proceed.    History of Present Illness: Laura [name] is a 51 y.o. who identifies as a female who was assigned female at birth, and is being seen today for COVID. She reports her symptoms started Wednesday and tested positive last night and today.   HPI: Cough This is a new problem. The current episode started in the past 7 days. The problem has been waxing and waning. The problem occurs every few minutes. The cough is Non-productive. Associated symptoms include nasal congestion, postnasal drip and rhinorrhea. Pertinent negatives include no chills, ear congestion, ear pain, fever, headaches, myalgias, shortness of breath or wheezing. Sweats: 0.She has tried rest and OTC cough suppressant for the symptoms. The treatment provided mild relief.   Problems:  Patient Active Problem List   Diagnosis Date Noted   Prerenal azotemia 07/03/2021   Strain of right middle finger 07/05/2020   Encounter for preventive health examination 12/08/2019   Open wound of right breast 09/30/2019   Postoperative breast asymmetry 07/11/2019   Pyriformis syndrome, right 03/23/2019   Educated about COVID-19 virus infection 03/23/2019   Open wound of left breast 12/17/2018   Hospital discharge follow-up 11/19/2018   Abscess of breast, left 10/31/2018   H/O oral aphthous ulcers 06/11/2018   Hepatic steatosis 01/21/2018   GAD (generalized  anxiety disorder) 12/29/2017   Fatigue 11/25/2016   IUD contraception 11/25/2016   Low grade squamous intraepithelial lesion (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 smear 11/25/2016   B12 deficiency 11/23/2016   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   Essential hypertension 01/03/2015   Long-term use of high-risk medication 09/09/2014   Obesity (BMI 35.0-39.9 without comorbidity) 02/21/2014    Allergies:  Allergies  Allergen Reactions   Bee Venom    Medications:  Current  Outpatient Medications:    benzonatate (TESSALON PERLES) 100 MG capsule, Take 1 capsule (100 mg total) by mouth 3 (three) times daily as needed., Disp: 20 capsule, Rfl: 0   molnupiravir EUA (LAGEVRIO) 200 mg CAPS capsule, Take 4 capsules (800 mg total) by mouth 2 (two) times daily for 5 days., Disp: 40 capsule, Rfl: 0   acetaminophen (TYLENOL) 500 MG tablet, Take 2 tablets (1,000 mg total) by mouth every 6 (six) hours as needed for mild pain or fever., Disp: 30 tablet, Rfl: 0   ALPRAZolam (XANAX) 0.5 MG tablet, TAKE 1 TABLET BY MOUTH AT BEDTIME AS NEEDED FOR ANXIETY, Disp: 30 tablet, Rfl: 5   cyanocobalamin (,VITAMIN B-12,) 1000 MCG/ML injection, INJECT 1 ML (1,000 MCG TOTAL) INTO THE MUSCLE ONCE A WEEK., Disp: 12 mL, Rfl: 5   desvenlafaxine (PRISTIQ) 50 MG 24 hr tablet, TAKE 1 TABLET BY MOUTH EVERY DAY, Disp: 90 tablet, Rfl: 1   esomeprazole (NEXIUM) 20 MG capsule, Take 20 mg by mouth daily at 12 noon., Disp: , Rfl:    Lactobacillus (PROBIOTIC ACIDOPHILUS PO), Take 1 tablet by mouth daily., Disp: , Rfl:    levonorgestrel (MIRENA) 20 MCG/24HR IUD, 1 each by Intrauterine route once., Disp: , Rfl:    losartan-hydrochlorothiazide (HYZAAR) 50-12.5 MG tablet, TAKE 1 TABLET BY MOUTH EVERY DAY, Disp: 90 tablet, Rfl: 0   meloxicam (MOBIC) 15 MG tablet, Take 1 tablet (15 mg total) by mouth daily., Disp: 30 tablet, Rfl: 0   metFORMIN (GLUCOPHAGE-XR) 500 MG 24 hr tablet, Take 1 tablet (500 mg total) by mouth daily with breakfast., Disp: 90 tablet, Rfl: 1   Multiple Vitamin (MULTIVITAMIN) tablet, Take 1 tablet by mouth daily., Disp: , Rfl:    Semaglutide,0.25 or 0.5MG /DOS, (OZEMPIC, 0.25 OR 0.5 MG/DOSE,) 2 MG/1.5ML SOPN, Inject 0.25 mg into the skin once a week., Disp: 1.5 mL, Rfl: 2   Syringe/Needle, Disp, (SYRINGE 3CC/25GX1") 25G X 1" 3 ML MISC, Use for b12 injections, Disp: 50 each, Rfl: 0  Observations/Objective: Patient is well-developed, well-nourished in no acute distress.  Resting comfortably  at  home.  Head is normocephalic, atraumatic.  No labored breathing.  Speech is clear and coherent with logical content.  Patient is alert and oriented at baseline.  Nasal congestion  Assessment and Plan: 1. COVID-19 - molnupiravir EUA (LAGEVRIO) 200 mg CAPS capsule; Take 4 capsules (800 mg total) by mouth 2 (two) times daily for 5 days.  Dispense: 40 capsule; Refill: 0 - benzonatate (TESSALON PERLES) 100 MG capsule; Take 1 capsule (100 mg total) by mouth 3 (three) times daily as needed.  Dispense: 20 capsule; Refill: 0  COVID positive, rest, force fluids, tylenol as needed, Quarantine for at least 5 days and you are fever free, then must wear a mask out in public from day 6-54, report any worsening symptoms such as increased shortness of breath, swelling, or continued high fevers. Possible adverse effects discussed with antivirals.    Follow Up Instructions: I discussed the assessment and treatment plan with the patient. The patient was provided an  opportunity to ask questions and all were answered. The patient agreed with the plan and demonstrated an understanding of the instructions.  A copy of instructions were sent to the patient via MyChart unless otherwise noted below.     The patient was advised to call back or seek an in-person evaluation if the symptoms worsen or if the condition fails to improve as anticipated.  Time:  I spent 8 minutes with the patient via telehealth technology discussing the above problems/concerns.    Evelina Dun, FNP

## 2021-12-09 ENCOUNTER — Other Ambulatory Visit: Payer: Self-pay | Admitting: Internal Medicine

## 2021-12-14 ENCOUNTER — Other Ambulatory Visit: Payer: Self-pay | Admitting: Internal Medicine

## 2022-02-20 ENCOUNTER — Ambulatory Visit: Payer: BC Managed Care – PPO | Admitting: Internal Medicine

## 2022-02-21 ENCOUNTER — Ambulatory Visit: Payer: BC Managed Care – PPO | Admitting: Dermatology

## 2022-03-01 ENCOUNTER — Ambulatory Visit: Payer: BC Managed Care – PPO | Admitting: Internal Medicine

## 2022-03-06 ENCOUNTER — Ambulatory Visit: Payer: BC Managed Care – PPO | Admitting: Internal Medicine

## 2022-03-06 ENCOUNTER — Encounter: Payer: Self-pay | Admitting: Internal Medicine

## 2022-03-06 VITALS — BP 137/94 | HR 86 | Temp 98.1°F | Ht 66.0 in | Wt 259.4 lb

## 2022-03-06 DIAGNOSIS — L88 Pyoderma gangrenosum: Secondary | ICD-10-CM

## 2022-03-06 DIAGNOSIS — D472 Monoclonal gammopathy: Secondary | ICD-10-CM

## 2022-03-06 DIAGNOSIS — I10 Essential (primary) hypertension: Secondary | ICD-10-CM | POA: Diagnosis not present

## 2022-03-06 DIAGNOSIS — E669 Obesity, unspecified: Secondary | ICD-10-CM

## 2022-03-06 DIAGNOSIS — M25551 Pain in right hip: Secondary | ICD-10-CM

## 2022-03-06 DIAGNOSIS — Z09 Encounter for follow-up examination after completed treatment for conditions other than malignant neoplasm: Secondary | ICD-10-CM

## 2022-03-06 HISTORY — DX: Pyoderma gangrenosum: L88

## 2022-03-06 LAB — LIPID PANEL
Cholesterol: 176 mg/dL (ref 0–200)
HDL: 57.3 mg/dL (ref >39.00)
LDL Cholesterol: 91 mg/dL (ref 0–99)
NonHDL: 118.28
Total CHOL/HDL Ratio: 3
Triglycerides: 138 mg/dL (ref 0.0–149.0)
VLDL: 27.6 mg/dL (ref 0.0–40.0)

## 2022-03-06 LAB — HEMOGLOBIN A1C: Hgb A1c MFr Bld: 5.8 % (ref 4.6–6.5)

## 2022-03-06 LAB — TSH: TSH: 1.27 u[IU]/mL (ref 0.35–5.50)

## 2022-03-06 MED ORDER — SEMAGLUTIDE (1 MG/DOSE) 4 MG/3ML ~~LOC~~ SOPN
1.0000 mg | PEN_INJECTOR | SUBCUTANEOUS | 2 refills | Status: DC
Start: 2022-03-06 — End: 2022-03-22

## 2022-03-06 NOTE — Assessment & Plan Note (Signed)
Secondary to OA.  Taking NSAIDS.  GI prophylaxis recommended with  PPI  

## 2022-03-06 NOTE — Patient Instructions (Addendum)
Increase your weekly dose of ozempic to 1.0 mg using 2 shots of the 0.5 mg dose per treatment (just once a week.  Same day)  ? ?If you are not losing weight by the 3 rd weekly dose of 1.0 mg , contact me so I can change the dose on file to 1.7 mg  weekly  ? ?Ok to combine with [employer] ? ?[facility] has done the screening for autoimmune syndromes.  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  They have referred you to [facility] for further evaluation  ? ? ? ?

## 2022-03-06 NOTE — Assessment & Plan Note (Addendum)
2nd occurrence ,  Left mid axillary line. [facility] management reviewed.   ?

## 2022-03-06 NOTE — Progress Notes (Signed)
? ?Subjective:  ?Patient ID: Laura [name], female    DOB: January 15, 1971  Age: 51 y.o. MRN: [id number] ? ?CC: The primary encounter diagnosis was Obesity (BMI 35.0-39.9 without comorbidity). Diagnoses of Essential hypertension, Pyoderma gangrenosum, MGUS (monoclonal gammopathy of unknown significance), Hospital discharge follow-up, and Right hip pain were also pertinent to this visit. ? ? ?This visit occurred during the SARS-CoV-2 public health emergency.  Safety protocols were in place, including screening questions prior to the visit, additional usage of staff PPE, and extensive cleaning of exam room while observing appropriate contact time as indicated for disinfecting solutions.   ? ?HPI ?Laura [name] presents for hospital follow up ?Chief Complaint  ?Patient presents with  ? Hospitalization Follow-up  ? ?Admitted to [facility] on March 26 with painful ulceration of left breast.  Treated for cellulitis  with broad spectrum antibiotics until biopsy was done suggestive of another pyoderma gangrenosum.  Cultures were negative.  Biopsy path report as follows:  ?  ?"Skin, Left flank, punch ?- Cutaneous neutrophil infiltrate with ulceration, suggestive of pyoderma gangrenosum, see comment  ?Electronically signed by Jonn Shingles, MD on 02/15/2022 at 1603  ?Diagnosis Comment  ?If infectious causes are excluded the changes would be consistent with pyoderma gangrenosum.  ?Clinical History  ?4 mm ulcer with rolled undermined borders and violaceous rim; Pyoderma vs infection ? ?During follow up with [facility], screening labs were normal except for MGUS of a IgA lambda.  Dermatology has referred her to Heme Onc for further evaluation  ? ?2)   using 4 motrin and 1 ES tylenol at bedtime for right hip aching.  No loss of ROM .  Left knee pain  ? ?3)  obesity:  gaining weight on Ozempic 0.5  mg weekly.  She skips breakfast..  if she gets hungry she eats cheese and crackers ( 10  Ritz,  6  squares of cheese).  Dinner is only  real meal:  cheeseburger  and handful of chips,    grilled  chicken tenders (3)  with  green beans and potatoes and a roll.  ? ?Outpatient Medications Prior to Visit  ?Medication Sig Dispense Refill  ? acetaminophen (TYLENOL) 500 MG tablet Take 2 tablets (1,000 mg total) by mouth every 6 (six) hours as needed for mild pain or fever. 30 tablet 0  ? ALPRAZolam (XANAX) 0.5 MG tablet TAKE 1 TABLET BY MOUTH AT BEDTIME AS NEEDED FOR ANXIETY 30 tablet 5  ? desvenlafaxine (PRISTIQ) 50 MG 24 hr tablet TAKE 1 TABLET BY MOUTH EVERY DAY 90 tablet 1  ? esomeprazole (NEXIUM) 20 MG capsule Take 20 mg by mouth daily at 12 noon.    ? Lactobacillus (PROBIOTIC ACIDOPHILUS PO) Take 1 tablet by mouth daily.    ? levonorgestrel (MIRENA) 20 MCG/24HR IUD 1 each by Intrauterine route once.    ? losartan-hydrochlorothiazide (HYZAAR) 50-12.5 MG tablet TAKE 1 TABLET BY MOUTH EVERY DAY 90 tablet 1  ? meloxicam (MOBIC) 15 MG tablet Take 1 tablet (15 mg total) by mouth daily. 30 tablet 0  ? Multiple Vitamin (MULTIVITAMIN) tablet Take 1 tablet by mouth daily.    ? Syringe/Needle, Disp, (SYRINGE 3CC/25GX1") 25G X 1" 3 ML MISC Use for b12 injections 50 each 0  ? 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 Then stop.    ? benzonatate (TESSALON PERLES) 100 MG capsule Take 1 capsule (100 mg total) by mouth 3 (three) times daily as needed.  20 capsule 0  ? OZEMPIC, 0.25 OR 0.5 MG/DOSE, 2 MG/1.5ML SOPN INJECT 0.'25MG'$  INTO THE SKIN ONE TIME PER WEEK (Patient taking differently: Inject 0.5 mg into the skin once a week.) 1.5 mL 2  ? cyanocobalamin (,VITAMIN B-12,) 1000 MCG/ML injection INJECT 1 ML (1,000 MCG TOTAL) INTO THE MUSCLE ONCE A WEEK. (Patient not taking: Reported on 03/06/2022) 12 mL 5  ? metFORMIN (GLUCOPHAGE-XR) 500 MG 24 hr tablet Take 1 tablet (500 mg total) by mouth daily with breakfast. (Patient not taking: Reported on 03/06/2022) 90 tablet 1  ? ?No facility-administered  medications prior to visit.  ? ? ?Review of Systems; ? ?Patient denies headache, fevers, malaise, unintentional weight loss, skin rash, eye pain, sinus congestion and sinus pain, sore throat, dysphagia,  hemoptysis , cough, dyspnea, wheezing, chest pain, palpitations, orthopnea, edema, abdominal pain, nausea, melena, diarrhea, constipation, flank pain, dysuria, hematuria, urinary  Frequency, nocturia, numbness, tingling, seizures,  Focal weakness, Loss of consciousness,  Tremor, insomnia, depression, anxiety, and suicidal ideation.   ? ? ? ?Objective:  ?BP (!) 137/94 (BP Location: Left Arm, Patient Position: Sitting, Cuff Size: Large)   Pulse 86   Temp 98.1 ?F (36.7 ?C) (Oral)   Ht '5\' 6"'$  (1.676 m)   Wt 259 lb 6.4 oz (117.7 kg)   SpO2 97%   BMI 41.87 kg/m?  ? ?BP Readings from Last 3 Encounters:  ?03/06/22 (!) 137/94  ?09/21/21 (!) 155/81  ?07/01/21 (!) 140/92  ? ? ?Wt Readings from Last 3 Encounters:  ?03/06/22 259 lb 6.4 oz (117.7 kg)  ?07/01/21 248 lb 8 oz (112.7 kg)  ?02/18/21 226 lb (102.5 kg)  ? ? ?General appearance: alert, cooperative and appears stated age ?Ears: normal TM's and external ear canals both ears ?Throat: lips, mucosa, and tongue normal; teeth and gums normal ?Neck: no adenopathy, no carotid bruit, supple, symmetrical, trachea midline and thyroid not enlarged, symmetric, no tenderness/mass/nodules ?Back: symmetric, no curvature. ROM normal. No CVA tenderness. ?Lungs: clear to auscultation bilaterally ?Heart: regular rate and rhythm, S1, S2 normal, no murmur, click, rub or gallop ?Abdomen: soft, non-tender; bowel sounds normal; no masses,  no organomegaly ?Pulses: 2+ and symmetric ?Skin: Skin color, texture, turgor normal. No rashes or lesions ?Lymph nodes: Cervical, supraclavicular, and axillary nodes normal. ? ?Lab Results  ?Component Value Date  ? HGBA1C 5.8 03/06/2022  ? HGBA1C 5.7 06/10/2021  ? HGBA1C 5.4 07/01/2020  ? ? ?Lab Results  ?Component Value Date  ? CREATININE 0.88 06/10/2021   ? CREATININE 1.02 07/16/2020  ? CREATININE 1.00 07/01/2020  ? ? ?Lab Results  ?Component Value Date  ? WBC 10.3 11/06/2018  ? HGB 13.6 11/06/2018  ? HCT 40.9 11/06/2018  ? PLT 315 11/06/2018  ? GLUCOSE 91 06/10/2021  ? CHOL 176 03/06/2022  ? TRIG 138.0 03/06/2022  ? HDL 57.30 03/06/2022  ? LDLDIRECT 59.0 11/22/2016  ? [place] 91 03/06/2022  ? ALT 24 06/10/2021  ? AST 16 06/10/2021  ? NA 142 06/10/2021  ? K 3.6 06/10/2021  ? CL 104 06/10/2021  ? CREATININE 0.88 06/10/2021  ? BUN 10 06/10/2021  ? CO2 25 06/10/2021  ? TSH 1.27 03/06/2022  ? HGBA1C 5.8 03/06/2022  ? MICROALBUR 0.9 07/01/2020  ? ? ?No results found. ? ?Assessment & Plan:  ? ?Problem List Items Addressed This Visit   ? ? Essential hypertension  ?  she reports compliance with medication regimen  but has an elevated reading today in office.  She is not checking at home and  using NSAIDs dail at bedtime y.  Discussed goal of 130/80 o r less   to preserve renal function.  She has been asked to check her  BP  at home and  submit readings for evaluation ? ?  ?  ? Hospital discharge follow-up  ?  Marland KitchenPatient is stable post discharge and has no new issues or questions about discharge plans at the visit today for hospital follow up. All labs , imaging studies and progress notes from admission were reviewed with patient today   ? ?  ?  ? MGUS (monoclonal gammopathy of unknown significance)  ?  Found during [facility] screening for autoimmune causes of pyodermia gangrenousm.  Heme onc referral has been made by [facility]  ? ?  ?  ? Obesity (BMI 35.0-39.9 without comorbidity) - Primary  ?  Advised to Increase ozempic to 1.0 mg weekly and resume Optavia diet.  ? ?  ?  ? Relevant Medications  ? Semaglutide, 1 MG/DOSE, 4 MG/3ML SOPN  ? Other Relevant Orders  ? Hemoglobin A1c (Completed)  ? Lipid panel (Completed)  ? TSH (Completed)  ? Pyoderma gangrenosum  ?  2nd occurrence ,  Left mid axillary line. [facility] management reviewed.   ? ?  ?  ? Right hip pain  ?  Secondary to OA.  Taking  NSAIDS.  GI prophylaxis recommended with  PPI  ? ?  ?  ? ? ?Follow-up: No follow-ups on file. ? ? ?Crecencio Mc, MD ?

## 2022-03-06 NOTE — Assessment & Plan Note (Signed)
Found during [facility] screening for autoimmune causes of pyodermia gangrenousm.  Heme onc referral has been made by [facility]  ?

## 2022-03-06 NOTE — Assessment & Plan Note (Signed)
she reports compliance with medication regimen  but has an elevated reading today in office.  She is not checking at home and using NSAIDs dail at bedtime y.  Discussed goal of 130/80 o r less   to preserve renal function.  She has been asked to check her  BP  at home and  submit readings for evaluation ?

## 2022-03-06 NOTE — Assessment & Plan Note (Addendum)
Advised to Increase ozempic to 1.0 mg weekly and resume Optavia diet.  ?

## 2022-03-06 NOTE — Assessment & Plan Note (Signed)
Patient is stable post discharge and has no new issues or questions about discharge plans at the visit today for hospital follow up. All labs , imaging studies and progress notes from admission were reviewed with patient today   

## 2022-03-14 ENCOUNTER — Encounter: Payer: Self-pay | Admitting: Internal Medicine

## 2022-03-14 DIAGNOSIS — E669 Obesity, unspecified: Secondary | ICD-10-CM

## 2022-03-15 ENCOUNTER — Other Ambulatory Visit: Payer: Self-pay | Admitting: Internal Medicine

## 2022-03-15 DIAGNOSIS — I10 Essential (primary) hypertension: Secondary | ICD-10-CM

## 2022-03-15 MED ORDER — LOSARTAN POTASSIUM-HCTZ 50-12.5 MG PO TABS: 2.0000 | ORAL_TABLET | Freq: Every day | ORAL | 1 refills | Status: DC

## 2022-03-15 NOTE — Assessment & Plan Note (Signed)
BP not at goal on losartan/hct 50/12.5 , per home readings submitted via mychart message.  She has been  advised to double her dose and recheck one week  ?

## 2022-03-22 MED ORDER — SCOPOLAMINE 1 MG/3DAYS TD PT72: 1.0000 | MEDICATED_PATCH | TRANSDERMAL | 12 refills | Status: DC

## 2022-03-22 MED ORDER — PANTOPRAZOLE SODIUM 40 MG PO TBEC: 40.0000 mg | DELAYED_RELEASE_TABLET | Freq: Every day | ORAL | 3 refills | Status: DC

## 2022-03-22 MED ORDER — SEMAGLUTIDE (1 MG/DOSE) 4 MG/3ML ~~LOC~~ SOPN: 1.0000 mg | PEN_INJECTOR | SUBCUTANEOUS | 2 refills | Status: DC

## 2022-03-24 ENCOUNTER — Encounter: Payer: BC Managed Care – PPO | Admitting: Obstetrics and Gynecology

## 2022-04-08 ENCOUNTER — Other Ambulatory Visit: Payer: Self-pay | Admitting: Internal Medicine

## 2022-07-21 NOTE — Progress Notes (Unsigned)
GYNECOLOGY ANNUAL PHYSICAL EXAM PROGRESS NOTE  Subjective:    Laura [name] is a 51 y.o. G76P0020 female who presents to establish care and for an annual exam. The patient is sexually active. The patient participates in regular exercise: no. Has the patient ever been transfused or tattooed?: yes. The patient reports that there is not domestic violence in her life.    The patient has the following complaints today:  Patient is noting some occasional mild hot flushes.    Menstrual History: Menarche age: unknown.  No LMP recorded. (Menstrual status: IUD).      Gynecologic History:  Contraception: IUD, in place since 06/13/2018.  History of STI's: Denies  Last Pap: 09/27/2017. Results were: abnormal.  Notes h/o abnormal pap smears. (LSIL with Neg HPV) Last mammogram: 09/13/2020. Results were: normal Last colonoscopy: 2022, results were normal. Repeat in 10 years.     OB History  Gravida Para Term Preterm AB Living  2 0 0 0 2 0  SAB IAB Ectopic Multiple Live Births  0 0 0 0 0    # Outcome Date GA Lbr Len/2nd Weight Sex Delivery Anes PTL Lv  2 AB              Complications: Missed abortion  1 AB              Complications: Missed abortion    Past Medical History:  Diagnosis Date   Allergy    Dysplasia of cervix    Elevated hemoglobin ([place]) 05/29/2017   Herpes genitalia    Hypertension    HX of high readings    Increased BMI    Menstrual migraine     Past Surgical History:  Procedure Laterality Date   BREAST BIOPSY Left 10/2018   removal of abscess on the skin-   COLONOSCOPY WITH PROPOFOL N/A 02/18/2021   Procedure: COLONOSCOPY WITH PROPOFOL;  Surgeon: Robert Bellow, MD;  Location: [facility];  Service: Endoscopy;  Laterality: N/A;   DILATION AND CURETTAGE OF UTERUS  2014 FEB and August   INCISION AND DRAINAGE ABSCESS Left 10/31/2018   Procedure: INCISION AND DRAINAGE LEFT BREAST ABSCESS;  Surgeon: Olean Ree, MD;  Location: [facility];  Service:  General;  Laterality: Left;   IRRIGATION AND DEBRIDEMENT ABSCESS Left 11/05/2018   Procedure: IRRIGATION AND DEBRIDEMENT ABSCESS - BREAST;  Surgeon: Olean Ree, MD;  Location: [facility];  Service: General;  Laterality: Left;   MASTOPEXY Bilateral 09/10/2019   Procedure: MASTOPEXY;  Surgeon: Wallace Going, DO;  Location: [place];  Service: Plastics;  Laterality: Bilateral;   MOUTH SURGERY     REDUCTION MAMMAPLASTY Bilateral 08/2019   reducation with lift pt had infection afterwards   SCAR REVISION Left 09/10/2019   Procedure: left breast scar contracture excision;  Surgeon: Wallace Going, DO;  Location: [place];  Service: Plastics;  Laterality: Left;  2.5 hours for case, please   TONSILECTOMY/ADENOIDECTOMY WITH MYRINGOTOMY Bilateral 2011    Family History  Problem Relation Age of Onset   Arthritis Mother    Hypertension Mother    Diabetes Mother    Heart disease Father    Hyperlipidemia Father    Hypertension Father    Cancer Neg Hx    Breast cancer Neg Hx     Social History   Socioeconomic History   Marital status: Married    Spouse name: Not on file   Number of children: Not on file   Years of  education: Not on file   Highest education level: Not on file  Occupational History   Occupation: dispatcher    Employer: [employer]  Tobacco Use   Smoking status: Never   Smokeless tobacco: Never  Vaping Use   Vaping Use: Never used  Substance and Sexual Activity   Alcohol use: Yes    Comment: occasioally   Drug use: No   Sexual activity: Yes    Birth control/protection: I.U.D.  Other Topics Concern   Not on file  Social History Narrative   Not on file   Social Determinants of Health   Financial Resource Strain: Not on file  Food Insecurity: Not on file  Transportation Needs: Not on file  Physical Activity: Insufficiently Active (09/27/2017)   Exercise Vital Sign    Days of Exercise per Week: 2 days    Minutes  of Exercise per Session: 30 min  Stress: No Stress Concern Present (09/27/2017)   [place]    Feeling of Stress : Not at all  Social Connections: Not on file  Intimate Partner Violence: Not At Risk (09/27/2017)   Humiliation, Afraid, Rape, and Kick questionnaire    Fear of Current or Ex-Partner: No    Emotionally Abused: No    Physically Abused: No    Sexually Abused: No    Current Outpatient Medications on File Prior to Visit  Medication Sig Dispense Refill   acetaminophen (TYLENOL) 500 MG tablet Take 2 tablets (1,000 mg total) by mouth every 6 (six) hours as needed for mild pain or fever. 30 tablet 0   ALPRAZolam (XANAX) 0.5 MG tablet TAKE 1 TABLET BY MOUTH AT BEDTIME AS NEEDED FOR ANXIETY 30 tablet 5   cyanocobalamin (,VITAMIN B-12,) 1000 MCG/ML injection INJECT 1 ML (1,000 MCG TOTAL) INTO THE MUSCLE ONCE A WEEK. (Patient not taking: Reported on 03/06/2022) 12 mL 5   desvenlafaxine (PRISTIQ) 50 MG 24 hr tablet TAKE 1 TABLET BY MOUTH EVERY DAY 90 tablet 1   Lactobacillus (PROBIOTIC ACIDOPHILUS PO) Take 1 tablet by mouth daily.     levonorgestrel (MIRENA) 20 MCG/24HR IUD 1 each by Intrauterine route once.     losartan-hydrochlorothiazide (HYZAAR) 50-12.5 MG tablet TAKE 1 TABLET BY MOUTH EVERY DAY 90 tablet 1   meloxicam (MOBIC) 15 MG tablet Take 1 tablet (15 mg total) by mouth daily. 30 tablet 0   Multiple Vitamin (MULTIVITAMIN) tablet Take 1 tablet by mouth daily.     pantoprazole (PROTONIX) 40 MG tablet Take 1 tablet (40 mg total) by mouth daily. 30 tablet 3   scopolamine (TRANSDERM-SCOP) 1 MG/3DAYS Place 1 patch (1.5 mg total) onto the skin every 3 (three) days. 10 patch 12   Semaglutide, 1 MG/DOSE, 4 MG/3ML SOPN Inject 1 mg as directed once a week. 3 mL 2   Syringe/Needle, Disp, (SYRINGE 3CC/25GX1") 25G X 1" 3 ML MISC Use for b12 injections 50 each 0   triamcinolone ointment (KENALOG) 0.1 % Apply to pyoderma gangrenosum  once a day until healed; cover with bandage. Then stop.     No current facility-administered medications on file prior to visit.    Allergies  Allergen Reactions   Bee Venom      Review of Systems Constitutional: negative for chills, fatigue, fevers and sweats. Positive for intermittent hot flushes.  Eyes: negative for irritation, redness and visual disturbance Ears, nose, mouth, throat, and face: negative for hearing loss, nasal congestion, snoring and tinnitus Respiratory: negative for asthma, cough, sputum  Cardiovascular: negative for chest pain, dyspnea, exertional chest pressure/discomfort, irregular heart beat, palpitations and syncope Gastrointestinal: negative for abdominal pain, change in bowel habits, nausea and vomiting Genitourinary: negative for abnormal menstrual periods, genital lesions, sexual problems and vaginal discharge, dysuria and urinary incontinence Integument/breast: negative for breast lump, breast tenderness and nipple discharge Hematologic/lymphatic: negative for bleeding and easy bruising Musculoskeletal:negative for back pain and muscle weakness Neurological: negative for dizziness, headaches, vertigo and weakness Endocrine: negative for diabetic symptoms including polydipsia, polyuria and skin dryness Allergic/Immunologic: negative for hay fever and urticaria      Objective:  Blood pressure (!) 129/91, pulse (!) 104, height '5\' 6"'$  (1.676 m), weight 248 lb (112.5 kg).  Body mass index is 40.03 kg/m.    General Appearance:    Alert, cooperative, no distress, appears stated age  Head:    Normocephalic, without obvious abnormality, atraumatic  Eyes:    PERRL, conjunctiva/corneas clear, EOM's intact, both eyes  Ears:    Normal external ear canals, both ears  Nose:   Nares normal, septum midline, mucosa normal, no drainage or sinus tenderness  Throat:   Lips, mucosa, and tongue normal; teeth and gums normal  Neck:   Supple, symmetrical, trachea midline,  no adenopathy; thyroid: no enlargement/tenderness/nodules; no carotid bruit or JVD  Back:     Symmetric, no curvature, ROM normal, no CVA tenderness  Lungs:     Clear to auscultation bilaterally, respirations unlabored  Chest Wall:    No tenderness or deformity   Heart:    Regular rate and rhythm, S1 and S2 normal, no murmur, rub or gallop  Breast Exam:    No tenderness, masses, or nipple abnormality. Several well-healed scars along breast.   Abdomen:     Soft, non-tender, bowel sounds active all four quadrants, no masses, no organomegaly.    Genitalia:    Pelvic:external genitalia normal, vagina without lesions, discharge, or tenderness, rectovaginal septum  normal. Cervix normal in appearance, no cervical motion tenderness, IUD threads visible 3 cm in length. No adnexal masses or tenderness.  Uterus normal size, shape, mobile, regular contours, nontender.  Rectal:    Normal external sphincter.  No hemorrhoids appreciated. Internal exam not done.   Extremities:   Extremities normal, atraumatic, no cyanosis or edema  Pulses:   2+ and symmetric all extremities  Skin:   Skin color, texture, turgor normal, no rashes or lesions  Lymph nodes:   Cervical, supraclavicular, and axillary nodes normal  Neurologic:   CNII-XII intact, normal strength, sensation and reflexes throughout   .  Labs:  Lab Results  Component Value Date   WBC 10.3 11/06/2018   HGB 13.6 11/06/2018   HCT 40.9 11/06/2018   MCV 92.7 11/06/2018   PLT 315 11/06/2018    Lab Results  Component Value Date   CREATININE 0.88 06/10/2021   BUN 10 06/10/2021   NA 142 06/10/2021   K 3.6 06/10/2021   CL 104 06/10/2021   CO2 25 06/10/2021    Lab Results  Component Value Date   ALT 24 06/10/2021   AST 16 06/10/2021   ALKPHOS 59 06/10/2021   BILITOT 0.6 06/10/2021    Lab Results  Component Value Date   TSH 1.27 03/06/2022     Assessment:   1. Encounter for well woman exam with routine gynecological exam   2.  Establishing care with new doctor, encounter for   3. Cervical cancer screening   4. Encounter for screening mammogram for malignant neoplasm of breast  5. BMI 40.0-44.9, adult ([place])   6. Hot flushes, perimenopausal   7. IUD (intrauterine device) in place      Plan:  Blood tests: none ordered, performed by PCP. Breast self exam technique reviewed and patient encouraged to perform self-exam monthly. Contraception: IUD. Advised that IUDs are approved for up to 8 years.  Discussed healthy lifestyle modifications. Mammogram ordered.  Pap smear ordered. Discussed herbal remedies for perimenopausal symptoms.  Follow up in 1 year for annual exam.    Rubie Maid, MD [facility]

## 2022-07-25 ENCOUNTER — Ambulatory Visit: Payer: BC Managed Care – PPO | Admitting: Obstetrics and Gynecology

## 2022-07-25 ENCOUNTER — Encounter: Payer: Self-pay | Admitting: Obstetrics and Gynecology

## 2022-07-25 ENCOUNTER — Other Ambulatory Visit (HOSPITAL_COMMUNITY)
Admission: RE | Admit: 2022-07-25 | Discharge: 2022-07-25 | Disposition: A | Discharge status: Home / Self Care | Payer: BC Managed Care – PPO | Source: Ambulatory Visit | Attending: Obstetrics and Gynecology | Admitting: Obstetrics and Gynecology

## 2022-07-25 VITALS — BP 129/91 | HR 104 | Ht 66.0 in | Wt 248.0 lb

## 2022-07-25 DIAGNOSIS — Z975 Presence of (intrauterine) contraceptive device: Secondary | ICD-10-CM

## 2022-07-25 DIAGNOSIS — Z6841 Body Mass Index (BMI) 40.0 and over, adult: Secondary | ICD-10-CM

## 2022-07-25 DIAGNOSIS — Z124 Encounter for screening for malignant neoplasm of cervix: Secondary | ICD-10-CM | POA: Diagnosis not present

## 2022-07-25 DIAGNOSIS — Z01419 Encounter for gynecological examination (general) (routine) without abnormal findings: Secondary | ICD-10-CM | POA: Diagnosis not present

## 2022-07-25 DIAGNOSIS — Z1231 Encounter for screening mammogram for malignant neoplasm of breast: Secondary | ICD-10-CM | POA: Diagnosis not present

## 2022-07-25 DIAGNOSIS — Z7689 Persons encountering health services in other specified circumstances: Secondary | ICD-10-CM

## 2022-07-25 DIAGNOSIS — N951 Menopausal and female climacteric states: Secondary | ICD-10-CM

## 2022-07-26 ENCOUNTER — Encounter: Payer: Self-pay | Admitting: Obstetrics and Gynecology

## 2022-08-01 LAB — CYTOLOGY - PAP
Comment: NEGATIVE
Diagnosis: NEGATIVE
High risk HPV: NEGATIVE

## 2022-08-16 ENCOUNTER — Other Ambulatory Visit: Payer: Self-pay | Admitting: Internal Medicine

## 2022-09-12 ENCOUNTER — Ambulatory Visit
Admission: RE | Admit: 2022-09-12 | Discharge: 2022-09-12 | Disposition: A | Discharge status: Home / Self Care | Payer: BC Managed Care – PPO | Source: Ambulatory Visit | Attending: Obstetrics and Gynecology | Admitting: Obstetrics and Gynecology

## 2022-09-12 DIAGNOSIS — Z1231 Encounter for screening mammogram for malignant neoplasm of breast: Secondary | ICD-10-CM | POA: Diagnosis present

## 2022-11-18 ENCOUNTER — Other Ambulatory Visit: Payer: Self-pay | Admitting: Internal Medicine

## 2022-11-21 NOTE — Telephone Encounter (Signed)
Refilled: 07/03/2021 Last OV: 03/06/2022 Next OV: not scheduled

## 2022-11-23 NOTE — Telephone Encounter (Signed)
Pt has scheduled an appt for 11/29/2022.

## 2022-11-25 ENCOUNTER — Other Ambulatory Visit: Payer: Self-pay | Admitting: Internal Medicine

## 2022-11-29 ENCOUNTER — Ambulatory Visit: Payer: BC Managed Care – PPO | Admitting: Internal Medicine

## 2022-11-29 ENCOUNTER — Encounter: Payer: Self-pay | Admitting: Internal Medicine

## 2022-11-29 VITALS — BP 122/74 | HR 89 | Temp 97.7°F | Ht 66.0 in | Wt 248.6 lb

## 2022-11-29 DIAGNOSIS — F411 Generalized anxiety disorder: Secondary | ICD-10-CM

## 2022-11-29 DIAGNOSIS — M25551 Pain in right hip: Secondary | ICD-10-CM

## 2022-11-29 DIAGNOSIS — N951 Menopausal and female climacteric states: Secondary | ICD-10-CM

## 2022-11-29 DIAGNOSIS — Z79899 Other long term (current) drug therapy: Secondary | ICD-10-CM

## 2022-11-29 DIAGNOSIS — R7301 Impaired fasting glucose: Secondary | ICD-10-CM

## 2022-11-29 DIAGNOSIS — K760 Fatty (change of) liver, not elsewhere classified: Secondary | ICD-10-CM | POA: Diagnosis not present

## 2022-11-29 DIAGNOSIS — R200 Anesthesia of skin: Secondary | ICD-10-CM

## 2022-11-29 DIAGNOSIS — E669 Obesity, unspecified: Secondary | ICD-10-CM

## 2022-11-29 DIAGNOSIS — R202 Paresthesia of skin: Secondary | ICD-10-CM

## 2022-11-29 MED ORDER — BUPROPION HCL ER (XL) 150 MG PO TB24: 150.0000 mg | ORAL_TABLET | Freq: Every day | ORAL | 3 refills | Status: DC

## 2022-11-29 NOTE — Progress Notes (Unsigned)
Subjective:  Patient ID: Laura [name], female    DOB: [birth year]  Age: 52 y.o. MRN: [id number]  CC: There were no encounter diagnoses.   HPI Laura [name] presents for  Chief Complaint  Patient presents with   Medical Management of Chronic Issues    Medication refills   Last seen in April  was not losing weight on ozempic 0.5 mg weekly. Dose increased to 1.0 mg weekly but was also not working so she stopped it.  Following a different diet now and has lost several lbs. Intermtitent fasting .  Not exercising  regularly yet  Hypertension: patient checks blood pressure twice weekly at home.  Readings were elevated for several weeks and she double her dose of losartan hct  but then resolve and have currently bveen < 130/80 on one tablet daily  have been for the most part ,       Patient is following a reduce salt diet most days and is taking medications as prescribed   Fingers going numb on both hands,  both at rest and with activity.  Outpatient Medications Prior to Visit  Medication Sig Dispense Refill   acetaminophen (TYLENOL) 500 MG tablet Take 2 tablets (1,000 mg total) by mouth every 6 (six) hours as needed for mild pain or fever. 30 tablet 0   ALPRAZolam (XANAX) 0.5 MG tablet TAKE 1 TABLET BY MOUTH AT BEDTIME AS NEEDED FOR ANXIETY 30 tablet 0   desvenlafaxine (PRISTIQ) 50 MG 24 hr tablet TAKE 1 TABLET BY MOUTH EVERY DAY 90 tablet 1   Lactobacillus (PROBIOTIC ACIDOPHILUS PO) Take 1 tablet by mouth daily.     levonorgestrel (MIRENA) 20 MCG/24HR IUD 1 each by Intrauterine route once.     losartan-hydrochlorothiazide (HYZAAR) 50-12.5 MG tablet TAKE 1 TABLET BY MOUTH EVERY DAY 90 tablet 1   meloxicam (MOBIC) 15 MG tablet Take 1 tablet (15 mg total) by mouth daily. 30 tablet 0   Multiple Vitamin (MULTIVITAMIN) tablet Take 1 tablet by mouth daily.     Omega-3 1000 MG CAPS Take 2 capsules by mouth daily.     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 Then stop.     Semaglutide, 1 MG/DOSE, 4 MG/3ML SOPN Inject 1 mg as directed once a week. (Patient not taking: Reported on 11/29/2022) 3 mL 2   No facility-administered medications prior to visit.    Review of Systems;  Patient denies headache, fevers, malaise, unintentional weight loss, skin rash, eye pain, sinus congestion and sinus pain, sore throat, dysphagia,  hemoptysis , cough, dyspnea, wheezing, chest pain, palpitations, orthopnea, edema, abdominal pain, nausea, melena, diarrhea, constipation, flank pain, dysuria, hematuria, urinary  Frequency, nocturia, numbness, tingling, seizures,  Focal weakness, Loss of consciousness,  Tremor, insomnia, depression, anxiety, and suicidal ideation.      Objective:  BP 122/74   Pulse 89   Temp 97.7 F (36.5 C) (Oral)   Ht '5\' 6"'$  (1.676 m)   Wt 248 lb 9.6 oz (112.8 kg)   SpO2 94%   BMI 40.13 kg/m   BP Readings from Last 3 Encounters:  11/29/22 122/74  07/25/22 (!) 129/91  03/06/22 (!) 137/94    Wt Readings from Last 3 Encounters:  11/29/22 248 lb 9.6 oz (112.8 kg)  07/25/22 248 lb (112.5 kg)  03/06/22 259 lb 6.4 oz (117.7 kg)  Physical Exam  Lab Results  Component Value Date   HGBA1C 5.8 03/06/2022   HGBA1C 5.7 06/10/2021   HGBA1C 5.4 07/01/2020    Lab Results  Component Value Date   CREATININE 0.88 06/10/2021   CREATININE 1.02 07/16/2020   CREATININE 1.00 07/01/2020    Lab Results  Component Value Date   WBC 10.3 11/06/2018   HGB 13.6 11/06/2018   HCT 40.9 11/06/2018   PLT 315 11/06/2018   GLUCOSE 91 06/10/2021   CHOL 176 03/06/2022   TRIG 138.0 03/06/2022   HDL 57.30 03/06/2022   LDLDIRECT 59.0 11/22/2016   LDLCALC 91 03/06/2022   ALT 24 06/10/2021   AST 16 06/10/2021   NA 142 06/10/2021   K 3.6 06/10/2021   CL 104 06/10/2021   CREATININE 0.88 06/10/2021   BUN 10 06/10/2021   CO2 25  06/10/2021   TSH 1.27 03/06/2022   HGBA1C 5.8 03/06/2022   MICROALBUR 0.9 07/01/2020    MM 3D SCREEN BREAST BILATERAL  Result Date: 09/13/2022 CLINICAL DATA:  Screening. EXAM: DIGITAL SCREENING BILATERAL MAMMOGRAM WITH TOMOSYNTHESIS AND CAD TECHNIQUE: Bilateral screening digital craniocaudal and mediolateral oblique mammograms were obtained. Bilateral screening digital breast tomosynthesis was performed. The images were evaluated with computer-aided detection. COMPARISON:  Previous exam(s). ACR Breast Density Category b: There are scattered areas of fibroglandular density. FINDINGS: There are no findings suspicious for malignancy. IMPRESSION: No mammographic evidence of malignancy. A result letter of this screening mammogram will be mailed directly to the patient. RECOMMENDATION: Screening mammogram in one year. (Code:SM-B-01Y) BI-RADS CATEGORY  1: Negative. Electronically Signed   By: Franki Cabot M.D.   On: 09/13/2022 14:01    Assessment & Plan:  .There are no diagnoses linked to this encounter.   I provided 30 minutes of face-to-face time during this encounter reviewing patient's last visit with me, patient's  most recent visit with cardiology,  nephrology,  and neurology,  recent surgical and non surgical procedures, previous  labs and imaging studies, counseling on currently addressed issues,  and post visit ordering to diagnostics and therapeutics .   Follow-up: No follow-ups on file.   Crecencio Mc, MD

## 2022-11-29 NOTE — Patient Instructions (Signed)
Adding wellbutrin once daily in the morning  as a trial to help energy/libido   Return for blood work.  No fasting required , but DON'T EAT A CHEESEBURGER PRIIOR TO TESTING    YOU MAY NEED EMG /NERVE CONDUCTION STUDIES IF B12 LEVEL IS NORMAL ( TO WORK UP THE NUMBNESS AND TINGLING OF HANDS

## 2022-11-30 DIAGNOSIS — N951 Menopausal and female climacteric states: Secondary | ICD-10-CM | POA: Insufficient documentation

## 2022-11-30 DIAGNOSIS — R200 Anesthesia of skin: Secondary | ICD-10-CM | POA: Insufficient documentation

## 2022-11-30 NOTE — Assessment & Plan Note (Signed)
She is having flushing symptoms , loss of libido.  Checking FSH and LH

## 2022-11-30 NOTE — Assessment & Plan Note (Addendum)
Symptoms suggestive of CTS , less likely cervical spinal stenosis.  Checking B12 and folate,  ths and A1c if normal will need  Referring to EMG for nerve conduction studies

## 2022-11-30 NOTE — Assessment & Plan Note (Signed)
hepatic function panel is normal  Lab Results  Component Value Date   ALT 24 06/10/2021   AST 16 06/10/2021   ALKPHOS 59 06/10/2021   BILITOT 0.6 06/10/2021

## 2022-11-30 NOTE — Assessment & Plan Note (Addendum)
Did to achieve any weight loss with  ozempic increased to 1.0 mg weekly .  She has more success with dietary restrictions

## 2022-11-30 NOTE — Assessment & Plan Note (Signed)
Secondary to OA.  Taking NSAIDS.  GI prophylaxis recommended with  PPI

## 2022-11-30 NOTE — Assessment & Plan Note (Signed)
Controlled with Pristiq. Adding wellbutrin for improvement in libido

## 2022-11-30 NOTE — Assessment & Plan Note (Signed)
Hepatic enzymes have been  not been checked in over a year and need repeating given her weight gain. She has nosigns of cirrhosis or synthetic dysfunction .  continue low glycemic index diet, renew efforts at weight loss with goal BMI < 30 ,   Advised to consider Hep A/B vaccination series  Lab Results  Component Value Date   ALT 24 06/10/2021   AST 16 06/10/2021   ALKPHOS 59 06/10/2021   BILITOT 0.6 06/10/2021

## 2022-12-08 ENCOUNTER — Other Ambulatory Visit (INDEPENDENT_AMBULATORY_CARE_PROVIDER_SITE_OTHER): Payer: BC Managed Care – PPO

## 2022-12-08 DIAGNOSIS — R200 Anesthesia of skin: Secondary | ICD-10-CM

## 2022-12-08 DIAGNOSIS — K760 Fatty (change of) liver, not elsewhere classified: Secondary | ICD-10-CM | POA: Diagnosis not present

## 2022-12-08 DIAGNOSIS — R202 Paresthesia of skin: Secondary | ICD-10-CM

## 2022-12-08 DIAGNOSIS — R7301 Impaired fasting glucose: Secondary | ICD-10-CM

## 2022-12-08 DIAGNOSIS — Z79899 Other long term (current) drug therapy: Secondary | ICD-10-CM | POA: Diagnosis not present

## 2022-12-08 DIAGNOSIS — N951 Menopausal and female climacteric states: Secondary | ICD-10-CM

## 2022-12-08 LAB — CBC WITH DIFFERENTIAL/PLATELET
Basophils Absolute: 0 10*3/uL (ref 0.0–0.1)
Basophils Relative: 0.9 % (ref 0.0–3.0)
Eosinophils Absolute: 0 10*3/uL (ref 0.0–0.7)
Eosinophils Relative: 0.8 % (ref 0.0–5.0)
HCT: 43.3 % (ref 36.0–46.0)
Hemoglobin: 14.6 g/dL (ref 12.0–15.0)
Lymphocytes Relative: 39.6 % (ref 12.0–46.0)
Lymphs Abs: 1.9 10*3/uL (ref 0.7–4.0)
MCHC: 33.8 g/dL (ref 30.0–36.0)
MCV: 91.8 fl (ref 78.0–100.0)
Monocytes Absolute: 0.3 10*3/uL (ref 0.1–1.0)
Monocytes Relative: 6.1 % (ref 3.0–12.0)
Neutro Abs: 2.6 10*3/uL (ref 1.4–7.7)
Neutrophils Relative %: 52.6 % (ref 43.0–77.0)
Platelets: 237 10*3/uL (ref 150.0–400.0)
RBC: 4.71 Mil/uL (ref 3.87–5.11)
RDW: 12.7 % (ref 11.5–15.5)
WBC: 4.9 10*3/uL (ref 4.0–10.5)

## 2022-12-08 LAB — LIPID PANEL
Cholesterol: 140 mg/dL (ref 0–200)
HDL: 46.8 mg/dL (ref >39.00)
LDL Cholesterol: 68 mg/dL (ref 0–99)
NonHDL: 93.08
Total CHOL/HDL Ratio: 3
Triglycerides: 124 mg/dL (ref 0.0–149.0)
VLDL: 24.8 mg/dL (ref 0.0–40.0)

## 2022-12-08 LAB — COMPREHENSIVE METABOLIC PANEL
ALT: 37 U/L — ABNORMAL HIGH (ref 0–35)
AST: 24 U/L (ref 0–37)
Albumin: 4.4 g/dL (ref 3.5–5.2)
Alkaline Phosphatase: 50 U/L (ref 39–117)
BUN: 17 mg/dL (ref 6–23)
CO2: 26 mEq/L (ref 19–32)
Calcium: 9.4 mg/dL (ref 8.4–10.5)
Chloride: 104 mEq/L (ref 96–112)
Creatinine, Ser: 0.88 mg/dL (ref 0.40–1.20)
GFR: 75.81 mL/min (ref >60.00)
Glucose, Bld: 95 mg/dL (ref 70–99)
Potassium: 3.8 mEq/L (ref 3.5–5.1)
Sodium: 144 mEq/L (ref 135–145)
Total Bilirubin: 0.9 mg/dL (ref 0.2–1.2)
Total Protein: 7 g/dL (ref 6.0–8.3)

## 2022-12-08 LAB — LUTEINIZING HORMONE: LH: 34.1 m[IU]/mL

## 2022-12-08 LAB — HEMOGLOBIN A1C: Hgb A1c MFr Bld: 5.8 % (ref 4.6–6.5)

## 2022-12-08 LAB — B12 AND FOLATE PANEL
Folate: >23.8 ng/mL (ref >5.9)
Vitamin B-12: 821 pg/mL (ref 211–911)

## 2022-12-08 LAB — TSH: TSH: 1.29 u[IU]/mL (ref 0.35–5.50)

## 2022-12-08 LAB — FOLLICLE STIMULATING HORMONE: FSH: 63.8 m[IU]/mL

## 2022-12-10 ENCOUNTER — Encounter: Payer: Self-pay | Admitting: Internal Medicine

## 2022-12-15 ENCOUNTER — Encounter: Payer: Self-pay | Admitting: Internal Medicine

## 2022-12-26 ENCOUNTER — Encounter: Payer: Self-pay | Admitting: Internal Medicine

## 2023-02-16 ENCOUNTER — Other Ambulatory Visit: Payer: Self-pay | Admitting: Internal Medicine

## 2023-03-26 ENCOUNTER — Other Ambulatory Visit: Payer: Self-pay | Admitting: Internal Medicine

## 2023-05-25 ENCOUNTER — Other Ambulatory Visit: Payer: Self-pay | Admitting: Internal Medicine

## 2023-06-01 ENCOUNTER — Ambulatory Visit: Payer: BC Managed Care – PPO | Admitting: Internal Medicine

## 2023-07-05 ENCOUNTER — Encounter (INDEPENDENT_AMBULATORY_CARE_PROVIDER_SITE_OTHER): Payer: Self-pay

## 2023-08-13 ENCOUNTER — Encounter: Payer: Self-pay | Admitting: Internal Medicine

## 2023-08-13 ENCOUNTER — Ambulatory Visit: Payer: BC Managed Care – PPO | Admitting: Internal Medicine

## 2023-08-13 VITALS — BP 140/90 | HR 82 | Temp 98.2°F | Ht 66.0 in | Wt 261.0 lb

## 2023-08-13 DIAGNOSIS — Z23 Encounter for immunization: Secondary | ICD-10-CM

## 2023-08-13 DIAGNOSIS — K760 Fatty (change of) liver, not elsewhere classified: Secondary | ICD-10-CM

## 2023-08-13 DIAGNOSIS — E669 Obesity, unspecified: Secondary | ICD-10-CM

## 2023-08-13 DIAGNOSIS — R7303 Prediabetes: Secondary | ICD-10-CM | POA: Diagnosis not present

## 2023-08-13 DIAGNOSIS — R5383 Other fatigue: Secondary | ICD-10-CM

## 2023-08-13 DIAGNOSIS — I10 Essential (primary) hypertension: Secondary | ICD-10-CM | POA: Diagnosis not present

## 2023-08-13 DIAGNOSIS — R0683 Snoring: Secondary | ICD-10-CM

## 2023-08-13 DIAGNOSIS — Z1231 Encounter for screening mammogram for malignant neoplasm of breast: Secondary | ICD-10-CM

## 2023-08-13 LAB — COMPREHENSIVE METABOLIC PANEL
ALT: 31 U/L (ref 0–35)
AST: 20 U/L (ref 0–37)
Albumin: 3.8 g/dL (ref 3.5–5.2)
Alkaline Phosphatase: 57 U/L (ref 39–117)
BUN: 8 mg/dL (ref 6–23)
CO2: 27 mEq/L (ref 19–32)
Calcium: 8.9 mg/dL (ref 8.4–10.5)
Chloride: 105 mEq/L (ref 96–112)
Creatinine, Ser: 0.86 mg/dL (ref 0.40–1.20)
GFR: 77.56 mL/min (ref >60.00)
Glucose, Bld: 110 mg/dL — ABNORMAL HIGH (ref 70–99)
Potassium: 3.8 mEq/L (ref 3.5–5.1)
Sodium: 141 mEq/L (ref 135–145)
Total Bilirubin: 0.7 mg/dL (ref 0.2–1.2)
Total Protein: 6.6 g/dL (ref 6.0–8.3)

## 2023-08-13 LAB — LIPID PANEL
Cholesterol: 168 mg/dL (ref 0–200)
HDL: 53.1 mg/dL (ref >39.00)
LDL Cholesterol: 84 mg/dL (ref 0–99)
NonHDL: 114.7
Total CHOL/HDL Ratio: 3
Triglycerides: 153 mg/dL — ABNORMAL HIGH (ref 0.0–149.0)
VLDL: 30.6 mg/dL (ref 0.0–40.0)

## 2023-08-13 LAB — LDL CHOLESTEROL, DIRECT: Direct LDL: 53 mg/dL

## 2023-08-13 LAB — HEMOGLOBIN A1C: Hgb A1c MFr Bld: 6.2 % (ref 4.6–6.5)

## 2023-08-13 MED ORDER — LOSARTAN POTASSIUM-HCTZ 100-12.5 MG PO TABS: 1.0000 | ORAL_TABLET | Freq: Every day | ORAL | 1 refills | Status: DC

## 2023-08-13 MED ORDER — BUPROPION HCL ER (XL) 150 MG PO TB24: 150.0000 mg | ORAL_TABLET | Freq: Every day | ORAL | 1 refills | Status: DC

## 2023-08-13 MED ORDER — PANTOPRAZOLE SODIUM 40 MG PO TBEC: 40.0000 mg | DELAYED_RELEASE_TABLET | Freq: Every day | ORAL | 1 refills | Status: DC

## 2023-08-13 NOTE — Assessment & Plan Note (Addendum)
Hepatic enzymes are normal.  She has nosigns of cirrhosis or synthetic dysfunction .  Counselling given no how to follow low glycemic index diet, renew efforts at weight loss with goal BMI < 30 ,   wll consider offering  Zepbound through [employer] Results  Component Value Date   ALT 31 08/13/2023   AST 20 08/13/2023   ALKPHOS 57 08/13/2023   BILITOT 0.7 08/13/2023

## 2023-08-13 NOTE — Assessment & Plan Note (Signed)
Not at goal increase losartan to 100 mg /12.5 hcz

## 2023-08-13 NOTE — Patient Instructions (Addendum)
Avoid daily use of aleve or motrin.  You can TAKE up to 2000 mg of acetominophen (tylenol) every day safely  In divided doses ( 1000 mg every 12 hours.)   Your bp is too high.  I have ncreased your losrtan to 100 mg ([facility] rx [facility])  . Goal bp  is < 130/80  MEAL PREP ,  USE THE PREPARED FOODS (HERE ARE A FEW I USE):  [employer] "low carb power bowl"  entrees and  "Steamer" entrees are are great low carb entrees that microwave in 5 minutes      F[employer]KITCHEN cauliflower pizza bowls  Veggies Made Great ! Makes a low carb spinach & egg white frittata    You might want to try a premixed protein drink called Premier Protein shake for AN ON THE GO  breakfast  SKINNY POP   TO AVOID NIGHT TIME SNACKING  brush your teeth after dinner   Walk for 30 minutes on your days off .  Must do!  I HAVE ORDERED A HOME SLEEP STUDY TO RULE OUT SLEEP APNEA AS A CAUSE OF YOUR ELEVATED BLOOD PRESSURE AND DAYTIME FATIGUE.  THIS IS DONE BY A [facility] CALLED [facility]"  AND WILL TAKE 2 NIGHTS TO COMPLETE .

## 2023-08-13 NOTE — Progress Notes (Signed)
Subjective:  Patient ID: Laura [name], female    DOB: March 13, 1971  Age: 52 y.o. MRN: [id number]  CC: The primary encounter diagnosis was Essential hypertension. Diagnoses of Obesity (BMI 35.0-39.9 without comorbidity), Hepatic steatosis, Prediabetes, Encounter for screening mammogram for malignant neoplasm of breast, Snoring, Other fatigue, and Need for influenza vaccination were also pertinent to this visit.   HPI Laura [name] presents for  Chief Complaint  Patient presents with   Medical Management of Chronic Issues    6 month follow up    1) MORBID OBESITY  (BMI > 40, HTN, FATTY LIVER, )   Has gained 12 lbs since January .  Not exercising or eating well.  works 4 10 hours shifts.   Often skips breakfast /lunch.  No exercise.  Fast food often .    2) htn:  taking losartan hct  .  Home readings  not done recently.  Took losartan 1/2 hour before coming to app.   3) In general , she doesn't feel well  .  Fatigued.  Snores per husband .  Occasional use of NSAIDS but not daily   4) family history of CAD,  father had CABG at 40 ,  mom had  stent at age 73  diabetes   Outpatient Medications Prior to Visit  Medication Sig Dispense Refill   acetaminophen (TYLENOL) 500 MG tablet Take 2 tablets (1,000 mg total) by mouth every 6 (six) hours as needed for mild pain or fever. 30 tablet 0   ALPRAZolam (XANAX) 0.5 MG tablet TAKE 1 TABLET BY MOUTH AT BEDTIME AS NEEDED FOR ANXIETY 30 tablet 0   aspirin EC 81 MG tablet Take 81 mg by mouth daily. Swallow whole.     desvenlafaxine (PRISTIQ) 50 MG 24 hr tablet TAKE 1 TABLET BY MOUTH EVERY DAY 90 tablet 1   Lactobacillus (PROBIOTIC ACIDOPHILUS PO) Take 1 tablet by mouth daily.     levonorgestrel (MIRENA) 20 MCG/24HR IUD 1 each by Intrauterine route once.     meloxicam (MOBIC) 15 MG tablet Take 1 tablet (15 mg total) by mouth daily. 30 tablet 0   Multiple Vitamin (MULTIVITAMIN) tablet Take 1 tablet by mouth daily.     Omega-3 1000 MG CAPS Take 2  capsules by mouth daily.     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 Then stop.     buPROPion (WELLBUTRIN XL) 150 MG 24 hr tablet TAKE 1 TABLET BY MOUTH EVERY DAY 90 tablet 1   losartan-hydrochlorothiazide (HYZAAR) 50-12.5 MG tablet TAKE 1 TABLET BY MOUTH EVERY DAY 90 tablet 1   pantoprazole (PROTONIX) 40 MG tablet TAKE 1 TABLET BY MOUTH EVERY DAY 90 tablet 1   No facility-administered medications prior to visit.    Review of Systems;  Patient denies headache, fevers, malaise, unintentional weight loss, skin rash, eye pain, sinus congestion and sinus pain, sore throat, dysphagia,  hemoptysis , cough, dyspnea, wheezing, chest pain, palpitations, orthopnea, edema, abdominal pain, nausea, melena, diarrhea, constipation, flank pain, dysuria, hematuria, urinary  Frequency, nocturia, numbness, tingling, seizures,  Focal weakness, Loss of consciousness,  Tremor, insomnia, depression, anxiety, and suicidal ideation.      Objective:  BP (!) 140/90   Pulse 82   Temp 98.2 F (36.8 C) (Oral)   Ht 5\' 6"  (1.676 m)   Wt 261 lb (118.4 kg)   SpO2 98%   BMI 42.13 kg/m   BP Readings from Last 3 Encounters:  08/13/23 (!) 140/90  11/29/22 122/74  07/25/22 (!) 129/91    Wt Readings from Last 3 Encounters:  08/13/23 261 lb (118.4 kg)  11/29/22 248 lb 9.6 oz (112.8 kg)  07/25/22 248 lb (112.5 kg)    Physical Exam Vitals reviewed.  Constitutional:      General: She is not in acute distress.    Appearance: Normal appearance. She is obese. She is not ill-appearing, toxic-appearing or diaphoretic.  HENT:     Head: Normocephalic.  Eyes:     General: No scleral icterus.       Right eye: No discharge.        Left eye: No discharge.     Conjunctiva/sclera: Conjunctivae normal.  Cardiovascular:     Rate and Rhythm: Normal rate and regular rhythm.     Heart sounds: Normal heart sounds.  Pulmonary:     Effort: Pulmonary  effort is normal. No respiratory distress.     Breath sounds: Normal breath sounds.  Musculoskeletal:        General: Normal range of motion.  Skin:    General: Skin is warm and dry.  Neurological:     General: No focal deficit present.     Mental Status: She is alert and oriented to person, place, and time. Mental status is at baseline.  Psychiatric:        Mood and Affect: Mood normal.        Behavior: Behavior normal.        Thought Content: Thought content normal.        Judgment: Judgment normal.     Lab Results  Component Value Date   HGBA1C 6.2 08/13/2023   HGBA1C 5.8 12/08/2022   HGBA1C 5.8 03/06/2022    Lab Results  Component Value Date   CREATININE 0.86 08/13/2023   CREATININE 0.88 12/08/2022   CREATININE 0.88 06/10/2021    Lab Results  Component Value Date   WBC 4.9 12/08/2022   HGB 14.6 12/08/2022   HCT 43.3 12/08/2022   PLT 237.0 12/08/2022   GLUCOSE 110 (H) 08/13/2023   CHOL 168 08/13/2023   TRIG 153.0 (H) 08/13/2023   HDL 53.10 08/13/2023   LDLDIRECT 53.0 08/13/2023   LDLCALC 84 08/13/2023   ALT 31 08/13/2023   AST 20 08/13/2023   NA 141 08/13/2023   K 3.8 08/13/2023   CL 105 08/13/2023   CREATININE 0.86 08/13/2023   BUN 8 08/13/2023   CO2 27 08/13/2023   TSH 1.29 12/08/2022   HGBA1C 6.2 08/13/2023   MICROALBUR 0.9 07/01/2020    MM 3D SCREEN BREAST BILATERAL  Result Date: 09/13/2022 CLINICAL DATA:  Screening. EXAM: DIGITAL SCREENING BILATERAL MAMMOGRAM WITH TOMOSYNTHESIS AND CAD TECHNIQUE: Bilateral screening digital craniocaudal and mediolateral oblique mammograms were obtained. Bilateral screening digital breast tomosynthesis was performed. The images were evaluated with computer-aided detection. COMPARISON:  Previous exam(s). ACR Breast Density Category b: There are scattered areas of fibroglandular density. FINDINGS: There are no findings suspicious for malignancy. IMPRESSION: No mammographic evidence of malignancy. A result letter of this  screening mammogram will be mailed directly to the patient. RECOMMENDATION: Screening mammogram in one year. (Code:SM-B-01Y) BI-RADS CATEGORY  1: Negative. Electronically Signed   By: Bary Richard M.D.   On: 09/13/2022 14:01    Assessment & Plan:  .Essential hypertension Assessment & Plan: Not at goal increase losartan to 100 mg /12.5 hcz   Orders: -     Comprehensive metabolic panel -     PSG SLEEP STUDY; Future  Obesity (BMI 35.0-39.9 without comorbidity) -     Comprehensive metabolic panel -     PSG SLEEP STUDY; Future  Hepatic steatosis Assessment & Plan: Hepatic enzymes are normal.  She has nosigns of cirrhosis or synthetic dysfunction .  Counselling given no how to follow low glycemic index diet, renew efforts at weight loss with goal BMI < 30 ,   wll consider offering  Zepbound through [employer]  Component Value Date   ALT 31 08/13/2023   AST 20 08/13/2023   ALKPHOS 57 08/13/2023   BILITOT 0.7 08/13/2023     Orders: -     Lipid panel -     LDL cholesterol, direct  Prediabetes -     Hemoglobin A1c  Encounter for screening mammogram for malignant neoplasm of breast -     3D Screening Mammogram, Left and Right; Future  Snoring -     PSG SLEEP STUDY; Future  Other fatigue Assessment & Plan: OSA suspected given snoring,hypertension,  and obesity . Home sleep study ordered  Orders: -     PSG SLEEP STUDY; Future  Need for influenza vaccination -     Flu vaccine trivalent PF, 6mos and older(Flulaval,Afluria,Fluarix,Fluzone)  Other orders -     buPROPion HCl ER (XL); Take 1 tablet (150 mg total) by mouth daily.  Dispense: 90 tablet; Refill: 1 -     Pantoprazole Sodium; Take 1 tablet (40 mg total) by mouth daily.  Dispense: 90 tablet; Refill: 1 -     Losartan Potassium-HCTZ; Take 1 tablet by mouth daily.  Dispense: 90 tablet; Refill: 1     I provided 48  minutes of face-to-face time during this encounter reviewing patient's last visit with me,  patient's previous  labs and imaging studies, counseling on currently addressed issues,  and post visit ordering to diagnostics and therapeutics .   Follow-up: Return in about 6 months (around 02/10/2024).   Sherlene Shams, MD

## 2023-08-13 NOTE — Assessment & Plan Note (Signed)
OSA suspected given snoring,hypertension,  and obesity . Home sleep study ordered

## 2023-08-23 ENCOUNTER — Encounter: Payer: Self-pay | Admitting: Internal Medicine

## 2023-08-24 NOTE — Telephone Encounter (Signed)
Pt has not heard from anyone to schedule her sleep study?

## 2023-08-30 ENCOUNTER — Other Ambulatory Visit: Payer: Self-pay | Admitting: Medical Genetics

## 2023-08-30 ENCOUNTER — Telehealth: Payer: Self-pay

## 2023-08-30 DIAGNOSIS — Z006 Encounter for examination for normal comparison and control in clinical research program: Secondary | ICD-10-CM

## 2023-08-30 NOTE — Telephone Encounter (Signed)
Patient states she recently saw Dr. Duncan Dull and she wanted patient to have a sleep study.  Patient states she wanted to see if she needs to call someone to schedule her sleep study or if they will call her.  I let patient know that Dr. Darrick Huntsman has sent a message to [facility] and they will be contacting her to schedule her sleep study.  Patient states it has been three weeks since her appointment.  I let patient know that I will send Dr. Darrick Huntsman a message to let her know.

## 2023-08-31 NOTE — Telephone Encounter (Signed)
Spoke with Mel Almond at [facility] and confirmed that they have received the order for the sleep study. Mel Almond stated that they received it yesterday and will be giving the pt a call to get set up. Called pt to let her know and she gave a verbal understanding.

## 2023-09-17 ENCOUNTER — Encounter: Payer: Self-pay | Admitting: Internal Medicine

## 2023-09-17 DIAGNOSIS — G4733 Obstructive sleep apnea (adult) (pediatric): Secondary | ICD-10-CM | POA: Insufficient documentation

## 2023-09-17 NOTE — Assessment & Plan Note (Signed)
[facility] home sleep study report has been received and confirms moderate sleep apnea. 18 ahi both nights)   I will order the CPAP.  But the supplemental oxygen requirement is dubious as the last 4 people tested by the respiratory therapist had a need for oxygen  please schedule her a visit to have ambulatory saturations checked here in the office

## 2023-09-20 ENCOUNTER — Ambulatory Visit
Admission: RE | Admit: 2023-09-20 | Discharge: 2023-09-20 | Disposition: A | Discharge status: Home / Self Care | Payer: BC Managed Care – PPO | Source: Ambulatory Visit | Attending: Internal Medicine | Admitting: Internal Medicine

## 2023-09-20 DIAGNOSIS — Z1231 Encounter for screening mammogram for malignant neoplasm of breast: Secondary | ICD-10-CM | POA: Diagnosis present

## 2023-10-17 ENCOUNTER — Encounter: Payer: Self-pay | Admitting: Internal Medicine

## 2023-10-17 ENCOUNTER — Telehealth: Payer: Self-pay

## 2023-10-17 ENCOUNTER — Ambulatory Visit: Payer: BC Managed Care – PPO | Admitting: Internal Medicine

## 2023-10-17 VITALS — BP 128/68 | HR 95 | Temp 98.0°F | Resp 16 | Ht 66.0 in | Wt 271.2 lb

## 2023-10-17 DIAGNOSIS — E669 Obesity, unspecified: Secondary | ICD-10-CM | POA: Diagnosis not present

## 2023-10-17 DIAGNOSIS — F411 Generalized anxiety disorder: Secondary | ICD-10-CM

## 2023-10-17 DIAGNOSIS — G4733 Obstructive sleep apnea (adult) (pediatric): Secondary | ICD-10-CM

## 2023-10-17 NOTE — Progress Notes (Signed)
Subjective:  Patient ID: Laura [name], female    DOB: 10/17/1971  Age: 52 y.o. MRN: [id number]  CC: The primary encounter diagnosis was OSA (obstructive sleep apnea). Diagnoses of Obesity (BMI 35.0-39.9 without comorbidity) and GAD (generalized anxiety disorder) were also pertinent to this visit.   HPI Laura [name] presents for  Chief Complaint  Patient presents with   o2 walking test   OSA:  recently diagnosed.  Has been  using CPAP for 19 nights  and feeling more energetic in the mornings.  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 30 tablet 0   ALPRAZolam (XANAX) 0.5 MG tablet TAKE 1 TABLET BY MOUTH AT BEDTIME AS NEEDED FOR ANXIETY 30 tablet 0   aspirin EC 81 MG tablet Take 81 mg by mouth daily. Swallow whole.     buPROPion (WELLBUTRIN XL) 150 MG 24 hr tablet Take 1 tablet (150 mg total) by mouth daily. 90 tablet 1   desvenlafaxine (PRISTIQ) 50 MG 24 hr tablet TAKE 1 TABLET BY MOUTH EVERY DAY 90 tablet 1   Lactobacillus (PROBIOTIC ACIDOPHILUS PO) Take 1 tablet by mouth daily.     levonorgestrel (MIRENA) 20 MCG/24HR IUD 1 each by Intrauterine route once.     losartan-hydrochlorothiazide (HYZAAR) 100-12.5 MG tablet Take 1 tablet by mouth daily. 90 tablet 1   meloxicam (MOBIC) 15 MG tablet Take 1 tablet (15 mg total) by mouth daily. 30 tablet 0   Multiple Vitamin (MULTIVITAMIN) tablet Take 1 tablet by mouth daily.     Omega-3 1000 MG CAPS Take 2 capsules by mouth daily.      pantoprazole (PROTONIX) 40 MG tablet Take 1 tablet (40 mg total) by mouth daily. 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 Then stop.     No facility-administered medications prior to visit.    Review of Systems;  Patient denies headache, fevers, malaise, unintentional weight loss, skin rash, eye pain, sinus congestion and sinus pain, sore throat, dysphagia,  hemoptysis , cough, dyspnea, wheezing, chest pain, palpitations, orthopnea, edema, abdominal pain, nausea, melena, diarrhea, constipation, flank pain, dysuria, hematuria, urinary  Frequency, nocturia, numbness, tingling, seizures,  Focal weakness, Loss of consciousness,  Tremor, insomnia, depression, anxiety, and suicidal ideation.      Objective:  BP 128/68   Pulse 95   Temp 98 F (36.7 C)   Resp 16   Ht 5\' 6"  (1.676 m)   Wt 271 lb 4 oz (123 kg)   SpO2 96%   BMI 43.78 kg/m   BP Readings from Last 3 Encounters:  10/17/23 128/68  08/13/23 (!) 140/90  11/29/22 122/74    Wt Readings from Last 3 Encounters:  10/17/23 271 lb 4 oz (123 kg)  08/13/23 261 lb (118.4 kg)  11/29/22 248 lb 9.6 oz (112.8 kg)    Physical Exam Vitals reviewed.  Constitutional:      General: She is not in acute distress.    Appearance: Normal appearance. She is obese. She is not ill-appearing, toxic-appearing or diaphoretic.  HENT:     Head: Normocephalic.  Eyes:     General: No scleral icterus.       Right eye: No discharge.        Left eye: No discharge.     Conjunctiva/sclera: Conjunctivae normal.  Cardiovascular:     Rate and Rhythm: Normal rate and regular rhythm.     Heart sounds: Normal heart sounds.  Pulmonary:     Effort: Pulmonary effort is normal. No respiratory distress.     Breath sounds: Normal breath sounds.  Musculoskeletal:        General: Normal range of motion.  Skin:    General: Skin is warm and dry.  Neurological:     General:  No focal deficit present.     Mental Status: She is alert and oriented to person, place, and time. Mental status is at baseline.  Psychiatric:        Mood and Affect: Mood normal.        Behavior: Behavior normal.        Thought Content: Thought content normal.        Judgment: Judgment normal.    Lab Results  Component Value Date   HGBA1C 6.2 08/13/2023   HGBA1C 5.8 12/08/2022   HGBA1C 5.8 03/06/2022    Lab Results  Component Value Date   CREATININE 0.86 08/13/2023   CREATININE 0.88 12/08/2022   CREATININE 0.88 06/10/2021    Lab Results  Component Value Date   WBC 4.9 12/08/2022   HGB 14.6 12/08/2022   HCT 43.3 12/08/2022   PLT 237.0 12/08/2022   GLUCOSE 110 (H) 08/13/2023   CHOL 168 08/13/2023   TRIG 153.0 (H) 08/13/2023   HDL 53.10 08/13/2023   LDLDIRECT 53.0 08/13/2023   LDLCALC 84 08/13/2023   ALT 31 08/13/2023   AST 20 08/13/2023   NA 141 08/13/2023   K 3.8 08/13/2023   CL 105 08/13/2023   CREATININE 0.86 08/13/2023   BUN 8 08/13/2023   CO2 27 08/13/2023   TSH 1.29 12/08/2022   HGBA1C 6.2 08/13/2023   MICROALBUR 0.9 07/01/2020    MM 3D SCREENING MAMMOGRAM BILATERAL BREAST  Result Date: 09/24/2023 CLINICAL DATA:  Screening. EXAM: DIGITAL SCREENING BILATERAL MAMMOGRAM WITH TOMOSYNTHESIS AND CAD TECHNIQUE: Bilateral screening digital craniocaudal and mediolateral oblique mammograms were obtained. Bilateral screening digital breast tomosynthesis was performed. The images were evaluated with computer-aided detection. COMPARISON:  Previous exam(s). ACR Breast Density Category b: There are scattered areas of fibroglandular density. FINDINGS: There are no findings suspicious for malignancy. IMPRESSION: No mammographic evidence of malignancy. A result letter of this screening mammogram will be mailed directly to the patient. RECOMMENDATION: Screening mammogram in one year. (Code:SM-B-01Y) BI-RADS CATEGORY  1: Negative. Electronically Signed   By: Ted Mcalpine  M.D.   On: 09/24/2023 09:33    Assessment & Plan:  .OSA (obstructive sleep apnea) Assessment & Plan: [facility] home sleep study report has been received and confirms moderate sleep apnea. 18 ahi both nights)   I will order the CPAP.  But the supplemental oxygen requirement is not necessary   as  she has been ambulated for 3 minutes in the office and her oxygen saturation on room air was   96%     Obesity (BMI 35.0-39.9 without comorbidity) Assessment & Plan: Patient has been unable  to lose or maintain a healthy weight despite good effort. Encouraged to increase exercise involvement to include more intense aerobic activity for 30 minutes 5 days per week.  Screened for contraindications to use of  GLP 1 agonists for appetite suppression and she has none.  The risks and benefits of pharmacotherapy discussed and she is requesting a trial of therapy .  Samples of ozempic given for 3 months    GAD (generalized anxiety disorder) Assessment & Plan: Controlled with Pristiq. And wellbutrin for improvement in libido     Follow-up: Return in about 2 months (around 12/17/2023).   Sherlene Shams, MD

## 2023-10-17 NOTE — Patient Instructions (Addendum)
Start ozempic at 0.25 mg   weekly and advance after 4  weeks to 0.5 mg dose     Here are some local and online Sources for zepbound and [employer] (direct pay)   [facility]ASH PA[facility] FOR ZEPBOUND VIAL [place], [place] - [address]  Isaac Bliss  Drug store in [place] makes a compounded version of semiglutide [facility] (online  GLP)  [facility]:  (Online GLP )  Delrae Rend MD in [place] also provides medication through a [employer] program

## 2023-10-17 NOTE — Assessment & Plan Note (Signed)
[facility] home sleep study report has been received and confirms moderate sleep apnea. 18 ahi both nights)   I will order the CPAP.  But the supplemental oxygen requirement is not necessary   as  she has been ambulated for 3 minutes in the office and her oxygen saturation on room air was   96%

## 2023-10-17 NOTE — Telephone Encounter (Signed)
Medication Samples have been provided to the patient.  Drug name: Ozempic       Strength: 0.25 mg/ 0.5 mg        Qty: 3 boxes  LOT: [id number], [id number]  Exp.Date: 09/19/2024, 12/20/2024  Dosing instructions: Inject 0.25 mg into skin once weekly.   The patient has been instructed regarding the correct time, dose, and frequency of taking this medication, including desired effects and most common side effects.   Katalyn Matin 3:48 PM 10/17/2023

## 2023-10-19 ENCOUNTER — Encounter: Payer: Self-pay | Admitting: Internal Medicine

## 2023-10-19 NOTE — Assessment & Plan Note (Addendum)
Controlled with Pristiq. And wellbutrin for improvement in libido

## 2023-10-19 NOTE — Assessment & Plan Note (Signed)
Patient has been unable to lose or maintain a healthy weight despite good effort. Encouraged to increase exercise involvement to include more intense aerobic activity for 30 minutes 5 days per week.  Screened for contraindications to use of  GLP 1 agonists for appetite suppression and she has none.  The risks and benefits of pharmacotherapy discussed and she is requesting a trial of therapy .  Samples of ozempic given for 3 months

## 2023-11-28 ENCOUNTER — Other Ambulatory Visit: Payer: Self-pay | Admitting: Internal Medicine

## 2023-11-29 ENCOUNTER — Other Ambulatory Visit: Payer: Self-pay | Admitting: Internal Medicine

## 2023-12-03 ENCOUNTER — Other Ambulatory Visit: Payer: Self-pay | Admitting: Internal Medicine

## 2023-12-10 ENCOUNTER — Ambulatory Visit: Payer: 59 | Admitting: Internal Medicine

## 2023-12-24 ENCOUNTER — Ambulatory Visit (INDEPENDENT_AMBULATORY_CARE_PROVIDER_SITE_OTHER): Payer: 59 | Admitting: Internal Medicine

## 2023-12-24 VITALS — BP 132/80 | HR 101 | Ht 66.0 in | Wt 257.4 lb

## 2023-12-24 DIAGNOSIS — B9789 Other viral agents as the cause of diseases classified elsewhere: Secondary | ICD-10-CM | POA: Diagnosis not present

## 2023-12-24 DIAGNOSIS — R051 Acute cough: Secondary | ICD-10-CM

## 2023-12-24 DIAGNOSIS — J988 Other specified respiratory disorders: Secondary | ICD-10-CM | POA: Diagnosis not present

## 2023-12-24 DIAGNOSIS — J22 Unspecified acute lower respiratory infection: Secondary | ICD-10-CM

## 2023-12-24 DIAGNOSIS — G4733 Obstructive sleep apnea (adult) (pediatric): Secondary | ICD-10-CM | POA: Diagnosis not present

## 2023-12-24 MED ORDER — PREDNISONE 10 MG PO TABS: ORAL_TABLET | ORAL | 0 refills | Status: DC

## 2023-12-24 MED ORDER — HYDROCOD POLI-CHLORPHE POLI ER 10-8 MG/5ML PO SUER: 5.0000 mL | Freq: Two times a day (BID) | ORAL | 0 refills | Status: DC | PRN

## 2023-12-24 MED ORDER — BUPROPION HCL ER (XL) 150 MG PO TB24: 150.0000 mg | ORAL_TABLET | Freq: Every day | ORAL | 1 refills | Status: DC

## 2023-12-24 NOTE — Patient Instructions (Addendum)
Your cough is likely RSV or some other VIRUS but not COVID OR INFLUENZA BASED ON THE RAPID TESTS .  The viral panel takes 48 hours.   Prednisone taper for 6 days  Tussionex cough syrup for nighttime use (UNLESS YOU ARE PARTAKING ETOH; don't combine with alcohol or alprazolam because it contains Vicodin   You can continue Mucinex DM during the day and pain mucinex at night  on the nights you use Tussionex  Ok to use Afrin every 12 hours if needed for sinus congestion  TO PREVENT SINUSITIS  MAXIMUM USE 5 DAYS']  CAN BE COMBINED WITH FLONASE   Add the azithromycin if you develop colored sputum ,  fevers (t > 100.4)  or sinus pain

## 2023-12-24 NOTE — Progress Notes (Unsigned)
Subjective:  Patient ID: Laura [name], female    DOB: Sep 12, 1971  Age: 53 y.o. MRN: [id number]  CC: The primary encounter diagnosis was Acute cough. Diagnoses of Viral respiratory illness and OSA (obstructive sleep apnea) were also pertinent to this visit.   HPI Laura [name] presents for  Chief Complaint  Patient presents with   Medical Management of Chronic Issues    CPAP compliance     1) OSA:   she was diagnosed by home sleep study in Oct 2024 ,  AHI 18 events/hr. Patient  has become acclimated to use of CPAP  and averaging  a minimum of 6 hours per night usage report .  She has skipped seveRal nights during travel and during acute respiratory illness  but notes  improved daytime wakefulness and decreased fatigue   when she uses CPAP consistently   2) Respiratory illness : she has had a non productive cough for 48 hours,  some subjective fevers/chills but no documented T > 99.7  she is coughing more at night with supine position and notes sinus congestion without pain or purulent rhinorrhea  Outpatient Medications Prior to Visit  Medication Sig Dispense Refill   acetaminophen (TYLENOL) 500 MG tablet Take 2 tablets (1,000 mg total) by mouth every 6 (six) hours as needed for mild pain or fever. 30 tablet 0   ALPRAZolam (XANAX) 0.5 MG tablet TAKE 1 TABLET BY MOUTH AT BEDTIME AS NEEDED FOR ANXIETY 30 tablet 5   aspirin EC 81 MG tablet Take 81 mg by mouth daily. Swallow whole.     desvenlafaxine (PRISTIQ) 50 MG 24 hr tablet TAKE 1 TABLET BY MOUTH EVERY DAY 90 tablet 1   Lactobacillus (PROBIOTIC ACIDOPHILUS PO) Take 1 tablet by mouth daily.     levonorgestrel (MIRENA) 20 MCG/24HR IUD 1 each by Intrauterine route once.     losartan-hydrochlorothiazide (HYZAAR) 100-12.5 MG tablet TAKE 1 TABLET BY MOUTH EVERY DAY 90 tablet 1   meloxicam (MOBIC) 15 MG tablet Take 1 tablet (15 mg total) by mouth daily. 30 tablet 0   Multiple Vitamin (MULTIVITAMIN) tablet Take 1 tablet by mouth daily.      Omega-3 1000 MG CAPS Take 2 capsules by mouth daily.     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 Then stop.     buPROPion (WELLBUTRIN XL) 150 MG 24 hr tablet Take 1 tablet (150 mg total) by mouth daily. 90 tablet 1   No facility-administered medications prior to visit.    Review of Systems;  Patient denies headache, fevers, malaise, unintentional weight loss, skin rash, eye pain, sinus congestion and sinus pain, sore throat, dysphagia,  hemoptysis , cough, dyspnea, wheezing, chest pain, palpitations, orthopnea, edema, abdominal pain, nausea, melena, diarrhea, constipation, flank pain, dysuria, hematuria, urinary  Frequency, nocturia, numbness, tingling, seizures,  Focal weakness, Loss of consciousness,  Tremor, insomnia, depression, anxiety, and suicidal ideation.      Objective:  BP 132/80   Pulse (!) 101   Ht 5\' 6"  (1.676 m)   Wt 257 lb 6.4 oz (116.8 kg)   SpO2 95%   BMI 41.55 kg/m   BP Readings from Last 3 Encounters:  12/24/23 132/80  10/17/23 128/68  08/13/23 (!) 140/90    Wt Readings from Last 3 Encounters:  12/24/23 257 lb 6.4 oz (116.8 kg)  10/17/23 271  lb 4 oz (123 kg)  08/13/23 261 lb (118.4 kg)    Physical Exam Vitals reviewed.  Constitutional:      General: She is not in acute distress.    Appearance: Normal appearance. She is normal weight. She is not ill-appearing, toxic-appearing or diaphoretic.  HENT:     Head: Normocephalic.  Eyes:     General: No scleral icterus.       Right eye: No discharge.        Left eye: No discharge.     Conjunctiva/sclera: Conjunctivae normal.  Cardiovascular:     Rate and Rhythm: Normal rate and regular rhythm.     Heart sounds: Normal heart sounds.  Pulmonary:     Effort: Pulmonary effort is normal. No respiratory distress.     Breath sounds: Normal breath sounds.   Musculoskeletal:        General: Normal range of motion.  Skin:    General: Skin is warm and dry.  Neurological:     General: No focal deficit present.     Mental Status: She is alert and oriented to person, place, and time. Mental status is at baseline.  Psychiatric:        Mood and Affect: Mood normal.        Behavior: Behavior normal.        Thought Content: Thought content normal.        Judgment: Judgment normal.    Lab Results  Component Value Date   HGBA1C 6.2 08/13/2023   HGBA1C 5.8 12/08/2022   HGBA1C 5.8 03/06/2022    Lab Results  Component Value Date   CREATININE 0.86 08/13/2023   CREATININE 0.88 12/08/2022   CREATININE 0.88 06/10/2021    Lab Results  Component Value Date   WBC 4.9 12/08/2022   HGB 14.6 12/08/2022   HCT 43.3 12/08/2022   PLT 237.0 12/08/2022   GLUCOSE 110 (H) 08/13/2023   CHOL 168 08/13/2023   TRIG 153.0 (H) 08/13/2023   HDL 53.10 08/13/2023   LDLDIRECT 53.0 08/13/2023   LDLCALC 84 08/13/2023   ALT 31 08/13/2023   AST 20 08/13/2023   NA 141 08/13/2023   K 3.8 08/13/2023   CL 105 08/13/2023   CREATININE 0.86 08/13/2023   BUN 8 08/13/2023   CO2 27 08/13/2023   TSH 1.29 12/08/2022   HGBA1C 6.2 08/13/2023   MICROALBUR 0.9 07/01/2020    MM 3D SCREENING MAMMOGRAM BILATERAL BREAST Result Date: 09/24/2023 CLINICAL DATA:  Screening. EXAM: DIGITAL SCREENING BILATERAL MAMMOGRAM WITH TOMOSYNTHESIS AND CAD TECHNIQUE: Bilateral screening digital craniocaudal and mediolateral oblique mammograms were obtained. Bilateral screening digital breast tomosynthesis was performed. The images were evaluated with computer-aided detection. COMPARISON:  Previous exam(s). ACR Breast Density Category b: There are scattered areas of fibroglandular density. FINDINGS: There are no findings suspicious for malignancy. IMPRESSION: No mammographic evidence of malignancy. A result letter of this screening mammogram will be mailed directly to the patient. RECOMMENDATION:  Screening mammogram in one year. (Code:SM-B-01Y) BI-RADS CATEGORY  1: Negative. Electronically Signed   By: Ted Mcalpine M.D.   On: 09/24/2023 09:33    Assessment & Plan:  .Acute cough -     POC COVID-19 [facility]Now -     POCT Influenza A/B -     Respiratory virus panel  Viral respiratory illness Assessment & Plan: Rapid COVID and FLU tests negative   send out for RSV in progress.  Prednisone, cough suppressant (controlled substance) prescribed.  Advised to add azithromycin if symptoms of sinusitis  develop   OSA (obstructive sleep apnea) Assessment & Plan: Diagnosed by sleep study in Oct 2024. Marland Kitchen She is wearing her CPAP every night a minimum of 6 hours per night and notes improved daytime wakefulness and decreased fatigue     Other orders -     buPROPion HCl ER (XL); Take 1 tablet (150 mg total) by mouth daily.  Dispense: 90 tablet; Refill: 1 -     predniSONE; 6 tablets on Day 1 , then reduce by 1 tablet daily until gone  Dispense: 21 tablet; Refill: 0 -     Hydrocod Poli-Chlorphe Poli ER; Take 5 mLs by mouth every 12 (twelve) hours as needed for cough.  Dispense: 140 mL; Refill: 0       Follow-up: No follow-ups on file.   Sherlene Shams, MD

## 2023-12-25 DIAGNOSIS — B9789 Other viral agents as the cause of diseases classified elsewhere: Secondary | ICD-10-CM | POA: Insufficient documentation

## 2023-12-25 HISTORY — DX: Other viral agents as the cause of diseases classified elsewhere: B97.89

## 2023-12-25 LAB — POCT INFLUENZA A/B
Influenza A, POC: NEGATIVE
Influenza B, POC: NEGATIVE

## 2023-12-25 LAB — POC COVID19 BINAXNOW: SARS Coronavirus 2 Ag: NEGATIVE

## 2023-12-25 NOTE — Assessment & Plan Note (Signed)
Diagnosed by sleep study in Oct 2024. Marland Kitchen She is wearing her CPAP every night a minimum of 6 hours per night and notes improved daytime wakefulness and decreased fatigue

## 2023-12-25 NOTE — Assessment & Plan Note (Signed)
Rapid COVID and FLU tests negative   send out for RSV in progress.  Prednisone, cough suppressant (controlled substance) prescribed.  Advised to add azithromycin if symptoms of sinusitis develop

## 2023-12-26 ENCOUNTER — Encounter: Payer: Self-pay | Admitting: Internal Medicine

## 2023-12-26 LAB — RESPIRATORY VIRUS PANEL

## 2024-01-14 ENCOUNTER — Ambulatory Visit: Payer: BC Managed Care – PPO | Admitting: Internal Medicine

## 2024-02-11 ENCOUNTER — Ambulatory Visit: Payer: BC Managed Care – PPO | Admitting: Internal Medicine

## 2024-02-15 ENCOUNTER — Ambulatory Visit (INDEPENDENT_AMBULATORY_CARE_PROVIDER_SITE_OTHER): Payer: BC Managed Care – PPO | Admitting: Internal Medicine

## 2024-02-15 ENCOUNTER — Encounter: Payer: Self-pay | Admitting: Internal Medicine

## 2024-02-15 ENCOUNTER — Telehealth: Payer: Self-pay

## 2024-02-15 VITALS — BP 146/88 | HR 85 | Ht 66.0 in | Wt 255.6 lb

## 2024-02-15 DIAGNOSIS — E669 Obesity, unspecified: Secondary | ICD-10-CM | POA: Diagnosis not present

## 2024-02-15 DIAGNOSIS — I10 Essential (primary) hypertension: Secondary | ICD-10-CM

## 2024-02-15 DIAGNOSIS — E785 Hyperlipidemia, unspecified: Secondary | ICD-10-CM | POA: Diagnosis not present

## 2024-02-15 DIAGNOSIS — R7303 Prediabetes: Secondary | ICD-10-CM

## 2024-02-15 LAB — COMPREHENSIVE METABOLIC PANEL WITH GFR
ALT: 33 U/L (ref 0–35)
AST: 22 U/L (ref 0–37)
Albumin: 4.1 g/dL (ref 3.5–5.2)
Alkaline Phosphatase: 53 U/L (ref 39–117)
BUN: 10 mg/dL (ref 6–23)
CO2: 33 meq/L — ABNORMAL HIGH (ref 19–32)
Calcium: 8.7 mg/dL (ref 8.4–10.5)
Chloride: 102 meq/L (ref 96–112)
Creatinine, Ser: 0.89 mg/dL (ref 0.40–1.20)
GFR: 74.17 mL/min (ref >60.00)
Glucose, Bld: 103 mg/dL — ABNORMAL HIGH (ref 70–99)
Potassium: 4 meq/L (ref 3.5–5.1)
Sodium: 142 meq/L (ref 135–145)
Total Bilirubin: 0.6 mg/dL (ref 0.2–1.2)
Total Protein: 6.8 g/dL (ref 6.0–8.3)

## 2024-02-15 LAB — LIPID PANEL
Cholesterol: 163 mg/dL (ref 0–200)
HDL: 54.9 mg/dL (ref >39.00)
LDL Cholesterol: 80 mg/dL (ref 0–99)
NonHDL: 108.12
Total CHOL/HDL Ratio: 3
Triglycerides: 139 mg/dL (ref 0.0–149.0)
VLDL: 27.8 mg/dL (ref 0.0–40.0)

## 2024-02-15 LAB — LDL CHOLESTEROL, DIRECT: Direct LDL: 52 mg/dL

## 2024-02-15 NOTE — Progress Notes (Signed)
 Subjective:  Patient ID: Laura [name], female    DOB: [birth year]  Age: 53 y.o. MRN: [id number]  CC: The primary encounter diagnosis was Essential hypertension. Diagnoses of Prediabetes, Hyperlipidemia, unspecified hyperlipidemia type, and Obesity (BMI 35.0-39.9 without comorbidity) were also pertinent to this visit.   HPI Laura [name] presents for  Chief Complaint  Patient presents with   Medical Management of Chronic Issues    6 month follow up    1) HTN:  Patient is taking her medications as prescribed and notes no adverse effects.  Home BP readings have been done about once per week and are  generally < 130/80 .  She is avoiding added salt in her diet and walking regularly about 3 times per week for exercise  .   2) MDD:  taking wellbutrin and pristiq  3( chronic right hip pain: reviewed use of meloxicam   4) OSA:  stil using CPAP    averagin 6 hours per night ,   5) obesity,  she was givne samples of ozempic at last visit and lost 8 lbs by her home machine.  She also lost inches around her waist.  She would like to continue medicaiton..  she   is   Walking   a few days per week for exercise   6) history of pyoderma gangrenosa : has had follow up with dermatology ;  no recurrence.     Outpatient Medications Prior to Visit  Medication Sig Dispense Refill   acetaminophen (TYLENOL) 500 MG tablet Take 2 tablets (1,000 mg total) by mouth every 6 (six) hours as needed for mild pain or fever. 30 tablet 0   ALPRAZolam (XANAX) 0.5 MG tablet TAKE 1 TABLET BY MOUTH AT BEDTIME AS NEEDED FOR ANXIETY 30 tablet 5   aspirin EC 81 MG tablet Take 81 mg by mouth daily. Swallow whole.     buPROPion (WELLBUTRIN XL) 150 MG 24 hr tablet Take 1 tablet (150 mg total) by mouth daily. 90 tablet 1   desvenlafaxine (PRISTIQ) 50 MG 24 hr tablet TAKE 1 TABLET BY MOUTH EVERY DAY 90 tablet 1   Lactobacillus (PROBIOTIC ACIDOPHILUS PO) Take 1 tablet by mouth daily.     levonorgestrel (MIRENA) 20 MCG/24HR  IUD 1 each by Intrauterine route once.     losartan-hydrochlorothiazide (HYZAAR) 100-12.5 MG tablet TAKE 1 TABLET BY MOUTH EVERY DAY 90 tablet 1   meloxicam (MOBIC) 15 MG tablet Take 1 tablet (15 mg total) by mouth daily. 30 tablet 0   Multiple Vitamin (MULTIVITAMIN) tablet Take 1 tablet by mouth daily.     Omega-3 1000 MG CAPS Take 2 capsules by mouth daily.     pantoprazole (PROTONIX) 40 MG tablet TAKE 1 TABLET BY MOUTH EVERY DAY 90 tablet 1   chlorpheniramine-HYDROcodone (TUSSIONEX) 10-8 MG/5ML Take 5 mLs by mouth every 12 (twelve) hours as needed for cough. 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 Then stop.     No facility-administered medications prior to visit.    Review of Systems;  Patient denies headache, fevers, malaise, unintentional weight loss, skin rash, eye pain, sinus congestion and sinus pain, sore throat, dysphagia,  hemoptysis , cough, dyspnea, wheezing, chest pain, palpitations, orthopnea, edema, abdominal pain, nausea, melena, diarrhea, constipation, flank pain, dysuria, hematuria,  urinary  Frequency, nocturia, numbness, tingling, seizures,  Focal weakness, Loss of consciousness,  Tremor, insomnia, depression, anxiety, and suicidal ideation.      Objective:  BP (!) 146/88   Pulse 85   Ht 5\' 6"  (1.676 m)   Wt 255 lb 9.6 oz (115.9 kg)   SpO2 98%   BMI 41.25 kg/m   BP Readings from Last 3 Encounters:  02/15/24 (!) 146/88  12/24/23 132/80  10/17/23 128/68    Wt Readings from Last 3 Encounters:  02/15/24 255 lb 9.6 oz (115.9 kg)  12/24/23 257 lb 6.4 oz (116.8 kg)  10/17/23 271 lb 4 oz (123 kg)    Physical Exam  Lab Results  Component Value Date   HGBA1C 5.8 02/15/2024   HGBA1C 6.2 08/13/2023   HGBA1C 5.8 12/08/2022    Lab Results  Component Value Date    CREATININE 0.89 02/15/2024   CREATININE 0.86 08/13/2023   CREATININE 0.88 12/08/2022    Lab Results  Component Value Date   WBC 4.9 12/08/2022   HGB 14.6 12/08/2022   HCT 43.3 12/08/2022   PLT 237.0 12/08/2022   GLUCOSE 103 (H) 02/15/2024   CHOL 163 02/15/2024   TRIG 139.0 02/15/2024   HDL 54.90 02/15/2024   LDLDIRECT 52.0 02/15/2024   LDLCALC 80 02/15/2024   ALT 33 02/15/2024   AST 22 02/15/2024   NA 142 02/15/2024   K 4.0 02/15/2024   CL 102 02/15/2024   CREATININE 0.89 02/15/2024   BUN 10 02/15/2024   CO2 33 (H) 02/15/2024   TSH 1.29 12/08/2022   HGBA1C 5.8 02/15/2024   MICROALBUR 0.9 07/01/2020    MM 3D SCREENING MAMMOGRAM BILATERAL BREAST Result Date: 09/24/2023 CLINICAL DATA:  Screening. EXAM: DIGITAL SCREENING BILATERAL MAMMOGRAM WITH TOMOSYNTHESIS AND CAD TECHNIQUE: Bilateral screening digital craniocaudal and mediolateral oblique mammograms were obtained. Bilateral screening digital breast tomosynthesis was performed. The images were evaluated with computer-aided detection. COMPARISON:  Previous exam(s). ACR Breast Density Category b: There are scattered areas of fibroglandular density. FINDINGS: There are no findings suspicious for malignancy. IMPRESSION: No mammographic evidence of malignancy. A result letter of this screening mammogram will be mailed directly to the patient. RECOMMENDATION: Screening mammogram in one year. (Code:SM-B-01Y) BI-RADS CATEGORY  1: Negative. Electronically Signed   By: Ted Mcalpine M.D.   On: 09/24/2023 09:33    Assessment & Plan:  .Essential hypertension -     Comprehensive metabolic panel with GFR -     Ambulatory referral to Cardiology -     Microalbumin / creatinine urine ratio; Future  Prediabetes -     Hemoglobin A1c -     Comprehensive metabolic panel with GFR -     Ambulatory referral to Cardiology  Hyperlipidemia, unspecified hyperlipidemia type -     Lipid panel -     LDL cholesterol, direct -     Ambulatory  referral to Cardiology  Obesity (BMI 35.0-39.9 without comorbidity) Assessment & Plan: Patient has been unable to lose or maintain a healthy weight despite good effort. Encouraged to increase exercise involvement to include more intense aerobic activity for 30 minutes 5 days per week.  Screened for contraindications to use of  GLP 1 agonists for appetite suppression and she has none.  The risks and benefits of pharmacotherapy discussed and she is requesting a trial of therapy .  Samples of ozempic given , two boxes       I spent 34 minutes on the day of this face to face encounter reviewing  patient's  most recent visit with dermatology,,  prior relevant surgical and non surgical procedures, recent  labs and imaging studies, counseling on weight management,  reviewing the assessment and plan with patient, and post visit ordering and reviewing of  diagnostics and therapeutics with patient  .   Follow-up: No follow-ups on file.   Sherlene Shams, MD

## 2024-02-15 NOTE — Patient Instructions (Addendum)
 1) for your allergies:  You can increase zyrtec to twice daily  or try switching to allegra, which  is available generically as fexofenadine,  You can also  add flonase and /or azelastine both are otc  nasal sprays.     2) for your GERD:  Take protonix 30 minutes prior to eating to be effectinve.  You can add famotidine 20 mg before dinner  if evening symptoms persist   3) ozempic samples given.  You must start exercising 30 minutes daily.  Walking doesn't count if it doesn't make you short of breath  4) referral to cardiology for evaluation of CAD risk  5) Please check your BP at home once a day for one week and send me  7  readings.  Make sure you are seated,  legs UNCROSSED , arm at chest height , resting on a table . NOT WATCHING TV OR TALKING    6) please check your EOB or call your insurance company to find out why the tests weren't covered

## 2024-02-15 NOTE — Telephone Encounter (Signed)
 Medication Samples have been provided to the patient.  Drug name: Ozempic       Strength: 0.25 mg        Qty: 2 boxes  LOT: [id number]  Exp.Date: 07/20/2026  Dosing instructions: Inject 0.25 mg into skin once weekly.   The patient has been instructed regarding the correct time, dose, and frequency of taking this medication, including desired effects and most common side effects.   Kaylenn Civil 3:15 PM 02/15/2024

## 2024-02-16 LAB — HEMOGLOBIN A1C: Hgb A1c MFr Bld: 5.8 % (ref 4.6–6.5)

## 2024-02-17 NOTE — Assessment & Plan Note (Signed)
she reports compliance with medication regimen  but has an elevated reading today in office.  She is not using NSAIDs daily.  Discussed goal of 120/70  (130/80 for patients over 70)  to preserve renal function.  She has been asked to check her  BP  at home and  submit readings for evaluation. Renal function, electrolytes and screen for proteinuria are all normal .[id number]

## 2024-02-17 NOTE — Assessment & Plan Note (Signed)
 Patient has been unable to lose or maintain a healthy weight despite good effort. Encouraged to increase exercise involvement to include more intense aerobic activity for 30 minutes 5 days per week.  Screened for contraindications to use of  GLP 1 agonists for appetite suppression and Laura [name] has none.  The risks and benefits of pharmacotherapy discussed and Laura [name] is requesting a trial of therapy .  Samples of ozempic given , two boxes

## 2024-02-18 ENCOUNTER — Encounter: Payer: Self-pay | Admitting: Internal Medicine

## 2024-02-18 NOTE — Addendum Note (Signed)
 Addended by: Sherlene Shams on: 02/18/2024 09:39 AM   Modules accepted: Level of Service

## 2024-03-12 ENCOUNTER — Encounter: Payer: Self-pay | Admitting: Internal Medicine

## 2024-03-13 MED ORDER — LOSARTAN POTASSIUM-HCTZ 100-25 MG PO TABS: 1.0000 | ORAL_TABLET | Freq: Every day | ORAL | 1 refills | Status: DC

## 2024-03-16 MED ORDER — METFORMIN HCL ER 500 MG PO TB24
500.0000 mg | ORAL_TABLET | Freq: Every day | ORAL | 1 refills | Status: DC
Start: 2024-03-16 — End: 2024-07-23

## 2024-03-16 NOTE — Addendum Note (Signed)
 Addended by: Thersia Flax on: 03/16/2024 09:35 PM   Modules accepted: Orders

## 2024-05-21 ENCOUNTER — Ambulatory Visit: Attending: Internal Medicine | Admitting: Internal Medicine

## 2024-05-21 ENCOUNTER — Encounter: Payer: Self-pay | Admitting: Internal Medicine

## 2024-05-21 VITALS — BP 128/84 | HR 86 | Ht 66.0 in | Wt 251.0 lb

## 2024-05-21 DIAGNOSIS — Z8249 Family history of ischemic heart disease and other diseases of the circulatory system: Secondary | ICD-10-CM | POA: Diagnosis not present

## 2024-05-21 DIAGNOSIS — Z79899 Other long term (current) drug therapy: Secondary | ICD-10-CM

## 2024-05-21 DIAGNOSIS — I10 Essential (primary) hypertension: Secondary | ICD-10-CM

## 2024-05-21 DIAGNOSIS — R0609 Other forms of dyspnea: Secondary | ICD-10-CM | POA: Diagnosis not present

## 2024-05-21 DIAGNOSIS — R9431 Abnormal electrocardiogram [ECG] [EKG]: Secondary | ICD-10-CM

## 2024-05-21 MED ORDER — METOPROLOL TARTRATE 100 MG PO TABS: ORAL_TABLET | ORAL | 0 refills | Status: DC

## 2024-05-21 NOTE — Patient Instructions (Signed)
 Medication Instructions:  Your physician recommends that you continue on your current medications as directed. Please refer to the Current Medication list given to you today.    *If you need a refill on your cardiac medications before your next appointment, please call your pharmacy*  Lab Work: Your provider would like for you to return to have the following labs drawn: BMP, LPa.   Please go to [facility] [address], [place] [address] You do not need an appointment.  They are open from 8 am- 4:30 pm.  Lunch from 1:00 pm- 2:00 pm You will not need to be fasting.    Testing/Procedures: Your physician has requested that you have an echocardiogram. Echocardiography is a painless test that uses sound waves to create images of your heart. It provides your doctor with information about the size and shape of your heart and how well your heart's chambers and valves are working.   You may receive an ultrasound enhancing agent through an IV if needed to better visualize your heart during the echo. This procedure takes approximately one hour.  There are no restrictions for this procedure.  This will take place at [address] [facility]) [address], [place] [address]  Please note: We ask at that you not bring children with you during ultrasound (echo/ vascular) testing. Due to room size and safety concerns, children are not allowed in the ultrasound rooms during exams. Our front office staff cannot provide observation of children in our lobby area while testing is being conducted. An adult accompanying a patient to their appointment will only be allowed in the ultrasound room at the discretion of the ultrasound technician under special circumstances. We apologize for any inconvenience.     Your cardiac CT will be scheduled at one of the below locations:    [facility] [address] [place], [place]  [address] [phone]  If scheduled at [facility] or [facility], please arrive 15 mins early for check-in and test prep.  There is spacious parking and easy access to the radiology department from the [facility] entrance. Please enter here and check-in with the desk attendant.   Please follow these instructions carefully (unless otherwise directed):  An IV will be required for this test and Nitroglycerin will be given.  Hold all erectile dysfunction medications at least 3 days (72 hrs) prior to test. (Ie viagra, cialis, sildenafil, tadalafil, etc)   On the Night Before the Test: Be sure to Drink plenty of water. Do not consume any caffeinated/decaffeinated beverages or chocolate 12 hours prior to your test. Do not take any antihistamines 12 hours prior to your test.  On the Day of the Test: Drink plenty of water until 1 hour prior to the test. Do not eat any food 1 hour prior to test. You may take your regular medications prior to the test.  Take metoprolol (Lopressor) 100 mg two hours prior to test. If you take Furosemide/Hydrochlorothiazide /Spironolactone/Chlorthalidone, please HOLD on the morning of the test. Patients who wear a continuous glucose monitor MUST remove the device prior to scanning. FEMALES- please wear underwire-free bra if available, avoid dresses & tight clothing       After the Test: Drink plenty of water. After receiving IV contrast, you may experience a mild flushed feeling. This is normal. On occasion, you may experience a mild rash up to 24 hours after the test. This is not  dangerous. If this occurs, you can take Benadryl  25 mg, Zyrtec, Claritin, or Allegra and increase your fluid intake. (Patients taking Tikosyn should avoid Benadryl , and may take Zyrtec, Claritin, or Allegra) If you experience trouble breathing, this can be serious. If it is severe call 911 IMMEDIATELY. If it is mild, please call  our office.  We will call to schedule your test 2-4 weeks out understanding that some insurance companies will need an authorization prior to the service being performed.   For more information and frequently asked questions, please visit our website : [email or web address]  For non-scheduling related questions, please contact the cardiac imaging nurse navigator should you have any questions/concerns: Cardiac Imaging Nurse Navigators Direct Office Dial: [phone]   For scheduling needs, including cancellations and rescheduling, please call [place], [phone].    Follow-Up: At [facility], you and your health needs are our priority.  As part of our continuing mission to provide you with exceptional heart care, our providers are all part of one team.  This team includes your primary Cardiologist (physician) and Advanced Practice Providers or APPs (Physician Assistants and Nurse Practitioners) who all work together to provide you with the care you need, when you need it.  Your next appointment:   6 week(s)  Provider:   Lonni Hanson, MD

## 2024-05-21 NOTE — Progress Notes (Unsigned)
  Cardiology Office Note:  .   Date:  05/21/2024  ID:  Laura [name], DOB Apr 12, 1971, MRN [id number] PCP: Laura Verneita CROME, MD  [facility] Providers Cardiologist:  None { Click to update primary MD,subspecialty MD or APP then REFRESH:1}    History of Present Illness: .   Laura [name] is a 53 y.o. female with history of hypertension, hyperlipidemia, prediabetes, and family history of ASCVD, who has been referred by Dr. Tullo for further evaluation of cardiac risk.  Had to increase BP meds twice over last 6 months.  Some shortness of breath when walking from office to car.  Thinks out of shape but paying more attention to.  Present last several months, getting a little worse.  No chest pain.  Sometimes ankles swell.  No palpitations/LH.  Occ checks BP at home; similar to today's.  Doesn't exercise regularly.  ROS: See HPI  Studies Reviewed: SABRA   EKG Interpretation Date/Time:  Wednesday May 21 2024 16:24:31 EDT Ventricular Rate:  86 PR Interval:  154 QRS Duration:  98 QT Interval:  366 QTC Calculation: 437 R Axis:   77  Text Interpretation: Normal sinus rhythm Incomplete right bundle branch block Borderline ECG When compared with ECG of 08-Sep-2019 07:47, Incomplete right bundle branch block is now Present Confirmed by Diesha Rostad, Lonni [id number]) on 05/21/2024 4:30:41 PM    *** Risk Assessment/Calculations:   {Does this patient have ATRIAL FIBRILLATION?:[id number]}         Physical Exam:   VS:  BP 128/84 (BP Location: Left Arm, Patient Position: Sitting, Cuff Size: Large)   Pulse 86   Ht 5' 6 (1.676 m)   Wt 251 lb (113.9 kg)   SpO2 98%   BMI 40.51 kg/m    Wt Readings from Last 3 Encounters:  05/21/24 251 lb (113.9 kg)  02/15/24 255 lb 9.6 oz (115.9 kg)  12/24/23 257 lb 6.4 oz (116.8 kg)    General:  NAD. Neck: No JVD or HJR. Lungs: Clear to auscultation bilaterally without wheezes or crackles. Heart: Regular rate and rhythm without murmurs, rubs, or  gallops. Abdomen: Soft, nontender, nondistended. Extremities: No lower extremity edema.  ASSESSMENT AND PLAN: .    ***    {Are you ordering a CV Procedure (e.g. stress test, cath, DCCV, TEE, etc)?   Press F2        :[id number]}  Dispo: ***  Signed, Lonni Hanson, MD

## 2024-05-22 ENCOUNTER — Encounter: Payer: Self-pay | Admitting: Internal Medicine

## 2024-05-26 ENCOUNTER — Ambulatory Visit

## 2024-05-28 ENCOUNTER — Other Ambulatory Visit: Payer: Self-pay | Admitting: Internal Medicine

## 2024-05-30 ENCOUNTER — Other Ambulatory Visit: Payer: Self-pay | Admitting: Internal Medicine

## 2024-05-30 LAB — BASIC METABOLIC PANEL WITH GFR
BUN/Creatinine Ratio: 13 (ref 9–23)
BUN: 12 mg/dL (ref 6–24)
CO2: 25 mmol/L (ref 20–29)
Calcium: 9.6 mg/dL (ref 8.7–10.2)
Chloride: 102 mmol/L (ref 96–106)
Creatinine, Ser: 0.96 mg/dL (ref 0.57–1.00)
Glucose: 90 mg/dL (ref 70–99)
Potassium: 4.3 mmol/L (ref 3.5–5.2)
Sodium: 144 mmol/L (ref 134–144)
eGFR: 71 mL/min/1.73 (ref >59)

## 2024-05-30 LAB — LIPOPROTEIN A (LPA): Lipoprotein (a): 14.8 nmol/L (ref <75.0)

## 2024-05-30 MED ORDER — LOSARTAN POTASSIUM-HCTZ 100-25 MG PO TABS: 1.0000 | ORAL_TABLET | Freq: Every day | ORAL | 1 refills | Status: DC

## 2024-06-03 ENCOUNTER — Encounter (HOSPITAL_COMMUNITY): Payer: Self-pay

## 2024-06-03 ENCOUNTER — Ambulatory Visit: Payer: Self-pay | Admitting: Internal Medicine

## 2024-06-04 ENCOUNTER — Ambulatory Visit (HOSPITAL_COMMUNITY)
Admission: RE | Admit: 2024-06-04 | Discharge: 2024-06-04 | Disposition: A | Discharge status: Home / Self Care | Source: Ambulatory Visit | Attending: Cardiovascular Disease | Admitting: Cardiovascular Disease

## 2024-06-04 DIAGNOSIS — R0609 Other forms of dyspnea: Secondary | ICD-10-CM | POA: Diagnosis present

## 2024-06-04 LAB — ECHOCARDIOGRAM COMPLETE
AR max vel: 2.54 cm2
AV Area VTI: 2.53 cm2
AV Area mean vel: 2.48 cm2
AV Mean grad: 5 mmHg
AV Peak grad: 9.9 mmHg
Ao pk vel: 1.57 m/s
Area-P 1/2: 3.6 cm2
S' Lateral: 2.3 cm

## 2024-06-05 ENCOUNTER — Ambulatory Visit
Admission: RE | Admit: 2024-06-05 | Discharge: 2024-06-05 | Disposition: A | Discharge status: Home / Self Care | Source: Ambulatory Visit | Attending: Internal Medicine | Admitting: Internal Medicine

## 2024-06-05 DIAGNOSIS — R0609 Other forms of dyspnea: Secondary | ICD-10-CM | POA: Diagnosis present

## 2024-06-05 MED ORDER — DILTIAZEM HCL 25 MG/5ML IV SOLN
10.0000 mg | INTRAVENOUS | Status: DC | PRN
  Filled 2024-06-05: qty 5

## 2024-06-05 MED ORDER — NITROGLYCERIN 0.4 MG SL SUBL
0.8000 mg | SUBLINGUAL_TABLET | Freq: Once | SUBLINGUAL | Status: AC
  Administered 2024-06-05: 0.8 mg via SUBLINGUAL
  Filled 2024-06-05: qty 25

## 2024-06-05 MED ORDER — IOHEXOL 350 MG/ML SOLN
100.0000 mL | Freq: Once | INTRAVENOUS | Status: AC | PRN
  Administered 2024-06-05: 100 mL via INTRAVENOUS

## 2024-06-05 MED ORDER — METOPROLOL TARTRATE 5 MG/5ML IV SOLN
10.0000 mg | Freq: Once | INTRAVENOUS | Status: DC | PRN
  Filled 2024-06-05: qty 10

## 2024-06-05 NOTE — Progress Notes (Signed)
 Pt up and alert. Pt tolerated scan well. Pt educated to stay hydrated

## 2024-06-06 ENCOUNTER — Encounter: Payer: Self-pay | Admitting: Internal Medicine

## 2024-07-03 ENCOUNTER — Encounter: Payer: Self-pay | Admitting: Nurse Practitioner

## 2024-07-03 ENCOUNTER — Ambulatory Visit: Attending: Nurse Practitioner | Admitting: Nurse Practitioner

## 2024-07-03 VITALS — BP 124/82 | HR 84 | Ht 66.0 in | Wt 257.6 lb

## 2024-07-03 DIAGNOSIS — R0609 Other forms of dyspnea: Secondary | ICD-10-CM | POA: Diagnosis not present

## 2024-07-03 DIAGNOSIS — R7303 Prediabetes: Secondary | ICD-10-CM | POA: Diagnosis not present

## 2024-07-03 DIAGNOSIS — I10 Essential (primary) hypertension: Secondary | ICD-10-CM

## 2024-07-03 NOTE — Patient Instructions (Signed)
 Medication Instructions:  Your physician recommends that you continue on your current medications as directed. Please refer to the Current Medication list given to you today.   *If you need a refill on your cardiac medications before your next appointment, please call your pharmacy*  Lab Work: None ordered at this time   Follow-Up: At [facility], you and your health needs are our priority.  As part of our continuing mission to provide you with exceptional heart care, our providers are all part of one team.  This team includes your primary Cardiologist (physician) and Advanced Practice Providers or APPs (Physician Assistants and Nurse Practitioners) who all work together to provide you with the care you need, when you need it.  Your next appointment:   1 year(s)  Provider:   You may see Lonni Hanson, MD

## 2024-07-03 NOTE — Progress Notes (Signed)
 Office Visit    Patient Name: Laura [name] Date of Encounter: 07/03/2024  Primary Care Provider:  Marylynn Verneita CROME, MD Primary Cardiologist:  Lonni Hanson, MD  Cardiology APP:  Vivienne Lonni Ingle, NP   Chief Complaint    53 y.o. female with a history of hypertension, prediabetes, obesity, and family history of ASCVD, who presents for follow-up after recent testing related to dyspnea on exertion.  Past Medical History   Subjective   Past Medical History:  Diagnosis Date   Allergy    DOE (dyspnea on exertion)    a. 05/2024 Echo: EF 60-65%, no rwma, nl RV fxn, no significant valvular dzs; b. 05/2024 Cor CTA: Nl cors. Ca2+ = 0. No significant non-cardiac findings.   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   Past Surgical History:  Procedure Laterality Date   BREAST BIOPSY Left 10/2018   removal of abscess on the skin-   COLONOSCOPY WITH PROPOFOL  N/A 02/18/2021   Procedure: COLONOSCOPY WITH PROPOFOL ;  Surgeon: Dessa Reyes ORN, MD;  Location: [facility];  Service: Endoscopy;  Laterality: N/A;   DILATION AND CURETTAGE OF UTERUS  2014 FEB and August   INCISION AND DRAINAGE ABSCESS Left 10/31/2018   Procedure: INCISION AND DRAINAGE LEFT BREAST ABSCESS;  Surgeon: Desiderio Schanz, MD;  Location: [facility];  Service: General;  Laterality: Left;   IRRIGATION AND DEBRIDEMENT ABSCESS Left 11/05/2018   Procedure: IRRIGATION AND DEBRIDEMENT ABSCESS - BREAST;  Surgeon: Desiderio Schanz, MD;  Location: [facility];  Service: General;  Laterality: Left;   MASTOPEXY Bilateral 09/10/2019   Procedure: MASTOPEXY;  Surgeon: Lowery Estefana RAMAN, DO;  Location: [facility];  Service: Plastics;  Laterality: Bilateral;   MOUTH SURGERY     REDUCTION MAMMAPLASTY Bilateral 08/2019    reducation with lift pt had infection afterwards   SCAR REVISION Left 09/10/2019   Procedure: left breast scar contracture excision;  Surgeon: Lowery Estefana RAMAN, DO;  Location:  SURGERY CENTER;  Service: Plastics;  Laterality: Left;  2.5 hours for case, please   TONSILECTOMY/ADENOIDECTOMY WITH MYRINGOTOMY Bilateral 2011    Allergies  Allergies  Allergen Reactions   Bee Venom        History of Present Illness      53 y.o. y/o female with the above past medical history including hypertension, prediabetes, obesity, and family history of ASCVD.  She recently established care with Dr. Bennetta in July 2025 in the setting of progressive dyspnea on exertiond.  She underwent echocardiogram which showed an EF of 60 to 65% with no regional wall motion abnormalities or significant valvular disease.  She subsequently underwent coronary CT angiography which showed a calcium score of 0, normal coronary arteries, and no significant noncardiac findings.   Since her last visit, she still has noted some dyspnea on exertion or walking to her car, especially when it is hot out.  In the context of recent findings, she is encouraged and suspects that symptoms are largely related to deconditioning.  She does not experience wheezing.  She denies chest pain, palpitations, PND, orthopnea, dizziness, syncope, or early satiety.  She sometimes notes lower extremity swelling, especially when it is hot out.  Both she and her husband will frequently eat a convenience type,  processed foods.  She asked if a paleo diet is a reasonable approach to losing weight and we discussed the difficulty in sustaining such diets.  I encouraged her to consider a Mediterranean style diet.  Objective   Home Medications    Current Outpatient Medications  Medication Sig Dispense Refill   acetaminophen  (TYLENOL ) 500 MG tablet Take 2 tablets (1,000 mg total) by mouth every 6 (six) hours as needed for mild pain or fever. 30 tablet 0    ALPRAZolam  (XANAX ) 0.5 MG tablet TAKE 1 TABLET BY MOUTH AT BEDTIME AS NEEDED FOR ANXIETY (Patient taking differently: Take 0.5 mg by mouth at bedtime as needed for sleep or anxiety. TAKE 1 TABLET BY MOUTH AT BEDTIME AS NEEDED FOR ANXIETY) 30 tablet 5   aspirin EC 81 MG tablet Take 81 mg by mouth daily. Swallow whole.     buPROPion  (WELLBUTRIN  XL) 150 MG 24 hr tablet Take 1 tablet (150 mg total) by mouth daily. 90 tablet 1   desvenlafaxine  (PRISTIQ ) 50 MG 24 hr tablet TAKE 1 TABLET BY MOUTH EVERY DAY 90 tablet 1   Lactobacillus (PROBIOTIC ACIDOPHILUS PO) Take 1 tablet by mouth daily.     levonorgestrel  (MIRENA ) 20 MCG/24HR IUD 1 each by Intrauterine route once.     losartan -hydrochlorothiazide  (HYZAAR) 100-25 MG tablet Take 1 tablet by mouth daily. 90 tablet 1   meloxicam  (MOBIC ) 15 MG tablet Take 1 tablet (15 mg total) by mouth daily. (Patient taking differently: Take 15 mg by mouth daily as needed for pain.) 30 tablet 0   metoprolol  tartrate (LOPRESSOR ) 100 MG tablet TAKE 1 TABLET 2 HR PRIOR TO CARDIAC PROCEDURE 1 tablet 0   Multiple Vitamin (MULTIVITAMIN) tablet Take 1 tablet by mouth daily.     Omega-3 1000 MG CAPS Take 2 capsules by mouth daily.     pantoprazole  (PROTONIX ) 40 MG tablet TAKE 1 TABLET BY MOUTH EVERY DAY 90 tablet 1   metFORMIN  (GLUCOPHAGE -XR) 500 MG 24 hr tablet Take 1 tablet (500 mg total) by mouth daily with breakfast. (Patient not taking: Reported on 07/03/2024) 90 tablet 1   No current facility-administered medications for this visit.     Physical Exam    VS:  BP 124/82 (BP Location: Left Arm, Patient Position: Sitting, Cuff Size: Normal)   Pulse 84   Ht 5' 6 (1.676 m)   Wt 257 lb 9.6 oz (116.8 kg)   SpO2 98%   BMI 41.58 kg/m  , BMI Body mass index is 41.58 kg/m.          GEN: Well nourished, well developed, in no acute distress. HEENT: normal. Neck: Supple, no JVD, carotid bruits, or masses. Cardiac: RRR, no murmurs, rubs, or gallops. No clubbing, cyanosis,  edema.  Radials 2+/PT 2+ and equal bilaterally.  Respiratory:  Respirations regular and unlabored, clear to auscultation bilaterally. GI: Soft, nontender, nondistended, BS + x 4. MS: no deformity or atrophy. Skin: warm and dry, no rash. Neuro:  Strength and sensation are intact. Psych: Normal affect.  Accessory Clinical Findings    ECG personally reviewed by me today - EKG Interpretation Date/Time:  Thursday July 03 2024 08:06:38 EDT Ventricular Rate:  84 PR Interval:  152 QRS Duration:  100 QT Interval:  386 QTC Calculation: 456 R Axis:   92  Text Interpretation: Normal sinus rhythm Rightward axis Incomplete right bundle branch block When compared with ECG of 21-May-2024 16:24, No significant change was found Confirmed by Vivienne Bruckner [phone]) on 07/03/2024 8:16:07 AM  -  no acute changes.  Lab Results  Component Value Date   WBC 4.9 12/08/2022   HGB 14.6 12/08/2022   HCT 43.3 12/08/2022   MCV 91.8 12/08/2022   PLT 237.0 12/08/2022   Lab Results  Component Value Date   CREATININE 0.96 05/29/2024   BUN 12 05/29/2024   NA 144 05/29/2024   K 4.3 05/29/2024   CL 102 05/29/2024   CO2 25 05/29/2024   Lab Results  Component Value Date   ALT 33 02/15/2024   AST 22 02/15/2024   ALKPHOS 53 02/15/2024   BILITOT 0.6 02/15/2024   Lab Results  Component Value Date   CHOL 163 02/15/2024   HDL 54.90 02/15/2024   LDLCALC 80 02/15/2024   LDLDIRECT 52.0 02/15/2024   TRIG 139.0 02/15/2024   CHOLHDL 3 02/15/2024    Lab Results  Component Value Date   HGBA1C 5.8 02/15/2024   Lab Results  Component Value Date   TSH 1.29 12/08/2022       Assessment & Plan    1.  Dyspnea on exertion: Recent history of progressive dyspnea on exertion prompting cardiology evaluation in July.  No history of chest pain or wheezing.  Echocardiogram showed normal LV function without significant valvular disease.  Coronary CT angiogram showed calcium score of 0 with no significant coronary  artery disease.  We discussed results in detail today.  Suspect symptoms are largely due to deconditioning.  I strongly encouraged at least 150 minutes of moderate exercise weekly.  We also discussed trying to reduce intake of processed foods and moving to a more plant-based/Mediterranean diet.  2.  Hypertension: Controlled today at 124/82.  She remains on Hyzaar.  3.  Prediabetes: A1c of 5.8 in March.  She is on metformin .  4.  Morbid obesity: See #1.  Strongly encouraged regular physical activity and more plant-based whole foods diet with caloric restriction and focus on weight loss.  Would likely benefit from GLP-1 a if lifestyle modifications alone are inadequate.  5.  Hypertriglyceridemia: Historically managed with fish oil.  Triglycerides 139 in March.  LDL was 52 at that time in the absence of statin therapy.  6.  Disposition: Follow-up in 1 year or sooner if necessary.  Lonni Meager, NP 07/03/2024, 8:31 AM

## 2024-07-23 ENCOUNTER — Ambulatory Visit: Admitting: Internal Medicine

## 2024-07-23 ENCOUNTER — Encounter: Payer: Self-pay | Admitting: Internal Medicine

## 2024-07-23 VITALS — BP 136/84 | HR 86 | Ht 66.0 in | Wt 254.4 lb

## 2024-07-23 DIAGNOSIS — R7303 Prediabetes: Secondary | ICD-10-CM | POA: Diagnosis not present

## 2024-07-23 DIAGNOSIS — G8929 Other chronic pain: Secondary | ICD-10-CM

## 2024-07-23 DIAGNOSIS — I10 Essential (primary) hypertension: Secondary | ICD-10-CM

## 2024-07-23 DIAGNOSIS — M25551 Pain in right hip: Secondary | ICD-10-CM | POA: Diagnosis not present

## 2024-07-23 DIAGNOSIS — F411 Generalized anxiety disorder: Secondary | ICD-10-CM | POA: Diagnosis not present

## 2024-07-23 DIAGNOSIS — F419 Anxiety disorder, unspecified: Secondary | ICD-10-CM

## 2024-07-23 DIAGNOSIS — F5105 Insomnia due to other mental disorder: Secondary | ICD-10-CM

## 2024-07-23 LAB — COMPREHENSIVE METABOLIC PANEL WITH GFR
ALT: 32 U/L (ref 0–35)
AST: 23 U/L (ref 0–37)
Albumin: 4.1 g/dL (ref 3.5–5.2)
Alkaline Phosphatase: 55 U/L (ref 39–117)
BUN: 12 mg/dL (ref 6–23)
CO2: 30 meq/L (ref 19–32)
Calcium: 9.1 mg/dL (ref 8.4–10.5)
Chloride: 102 meq/L (ref 96–112)
Creatinine, Ser: 0.86 mg/dL (ref 0.40–1.20)
GFR: 77.05 mL/min (ref >60.00)
Glucose, Bld: 105 mg/dL — ABNORMAL HIGH (ref 70–99)
Potassium: 4.2 meq/L (ref 3.5–5.1)
Sodium: 141 meq/L (ref 135–145)
Total Bilirubin: 0.7 mg/dL (ref 0.2–1.2)
Total Protein: 7.1 g/dL (ref 6.0–8.3)

## 2024-07-23 LAB — MICROALBUMIN / CREATININE URINE RATIO
Creatinine,U: 109.2 mg/dL
Microalb Creat Ratio: 8.1 mg/g (ref 0.0–30.0)
Microalb, Ur: 0.9 mg/dL (ref 0.0–1.9)

## 2024-07-23 LAB — HEMOGLOBIN A1C: Hgb A1c MFr Bld: 6.3 % (ref 4.6–6.5)

## 2024-07-23 MED ORDER — BUPROPION HCL ER (XL) 150 MG PO TB24: 150.0000 mg | ORAL_TABLET | Freq: Every day | ORAL | 1 refills | Status: AC

## 2024-07-23 NOTE — Patient Instructions (Addendum)
 Here are some local and online Sources for zepbound and [employer]  (direct pay)   [employer] CASH PAY FOR ZEPBOUND VIAL [place], [place] - [phone]  [place] (online  GLP)  [facility] [facility]:  (Online GLP )  Paris Harvest MD in [place] also provides medication through a weight management    You can take A TOTAL OF 3000 mg of acetominophen (tylenol ) every day safely  In divided doses (750 mg  every 6 hours  Or 1000 mg every 8 hours.)   USE IT SO YOU CAN BE MORE ACTIVE  REFERRAL TO [facility] IS IN PROGRESS   STOP CATERING TO YOUR HUSBAND'S TASTES!  EAT WHAT YOU KNOW IS RIGHT

## 2024-07-23 NOTE — Progress Notes (Signed)
 Subjective:  Patient ID: Laura [name], female    DOB: 22-Nov-1970  Age: 53 y.o. MRN: [id number]  CC: The primary encounter diagnosis was Essential hypertension. Diagnoses of Prediabetes, Chronic right hip pain, GAD (generalized anxiety disorder), Insomnia secondary to anxiety, Morbid obesity (HCC), and Right hip pain were also pertinent to this visit.   HPI Laura [name] presents for  Chief Complaint  Patient presents with   Medical Management of Chronic Issues    Follow up on hypertension   1) HTN:  Hypertension: patient checks blood pressure twice weekly at home.  Readings have been for the most part <130/80 at rest . Patient is following a reduced salt diet most days and is taking medications as prescribed LOSARTAN  100/25   2) Obesity: she is not following a careful diet due to husband's food preferences,  and not exercising regularly   3) Insomnia:  Patient  continues to have difficulty  sleeping.  Wakes up 2 to 3 times per night . Bedtime hygiene reviewed,  Patient has been using an electronic book to read before bed.  Does not drink caffeinated beverages after 3 PM.  Only voids  bladder once per night.  No snoring partner. Does not drink alcohol to excess.  Not exercising excessively in the evening. Patient does have a history of anxiety but does not lie awake worrying about issues that cannot be resolved. Does not take stimulants. SABRA   3) Exertional chest pain/dyspnea:  noninvasive evaluation in July included Cardiac CT  and  Lipoprotein A level.  SABRA  CAC was zero. , Lpa was normal     Outpatient Medications Prior to Visit  Medication Sig Dispense Refill   acetaminophen  (TYLENOL ) 500 MG tablet Take 2 tablets (1,000 mg total) by mouth every 6 (six) hours as needed for mild pain or fever. 30 tablet 0   ALPRAZolam  (XANAX ) 0.5 MG tablet TAKE 1 TABLET BY MOUTH AT BEDTIME AS NEEDED FOR ANXIETY (Patient taking differently: Take 0.5 mg by mouth at bedtime as needed for sleep or anxiety.  TAKE 1 TABLET BY MOUTH AT BEDTIME AS NEEDED FOR ANXIETY) 30 tablet 5   desvenlafaxine  (PRISTIQ ) 50 MG 24 hr tablet TAKE 1 TABLET BY MOUTH EVERY DAY 90 tablet 1   Lactobacillus (PROBIOTIC ACIDOPHILUS PO) Take 1 tablet by mouth daily.     levonorgestrel  (MIRENA ) 20 MCG/24HR IUD 1 each by Intrauterine route once.     losartan -hydrochlorothiazide  (HYZAAR) 100-25 MG tablet Take 1 tablet by mouth daily. 90 tablet 1   Multiple Vitamin (MULTIVITAMIN) tablet Take 1 tablet by mouth daily.     Omega-3 1000 MG CAPS Take 2 capsules by mouth daily.     pantoprazole  (PROTONIX ) 40 MG tablet TAKE 1 TABLET BY MOUTH EVERY DAY 90 tablet 1   aspirin EC 81 MG tablet Take 81 mg by mouth daily. Swallow whole.     buPROPion  (WELLBUTRIN  XL) 150 MG 24 hr tablet Take 1 tablet (150 mg total) by mouth daily. 90 tablet 1   meloxicam  (MOBIC ) 15 MG tablet Take 1 tablet (15 mg total) by mouth daily. (Patient not taking: Reported on 07/23/2024) 30 tablet 0   metFORMIN  (GLUCOPHAGE -XR) 500 MG 24 hr tablet Take 1 tablet (500 mg total) by mouth daily with breakfast. (Patient not taking: Reported on 07/03/2024) 90 tablet 1   metoprolol  tartrate (LOPRESSOR ) 100 MG tablet TAKE 1 TABLET 2 HR PRIOR TO CARDIAC PROCEDURE 1 tablet 0   No facility-administered medications prior to visit.  Review of Systems;  Patient denies headache, fevers, malaise, unintentional weight loss, skin rash, eye pain, sinus congestion and sinus pain, sore throat, dysphagia,  hemoptysis , cough, dyspnea, wheezing, chest pain, palpitations, orthopnea, edema, abdominal pain, nausea, melena, diarrhea, constipation, flank pain, dysuria, hematuria, urinary  Frequency, nocturia, numbness, tingling, seizures,  Focal weakness, Loss of consciousness,  Tremor, insomnia, depression, anxiety, and suicidal ideation.      Objective:  BP 136/84   Pulse 86   Ht 5' 6 (1.676 m)   Wt 254 lb 6.4 oz (115.4 kg)   SpO2 97%   BMI 41.06 kg/m   BP Readings from Last 3 Encounters:   07/23/24 136/84  07/03/24 124/82  06/05/24 132/68    Wt Readings from Last 3 Encounters:  07/23/24 254 lb 6.4 oz (115.4 kg)  07/03/24 257 lb 9.6 oz (116.8 kg)  05/21/24 251 lb (113.9 kg)    Physical Exam Vitals reviewed.  Constitutional:      General: She is not in acute distress.    Appearance: Normal appearance. She is normal weight. She is not ill-appearing, toxic-appearing or diaphoretic.  HENT:     Head: Normocephalic.  Eyes:     General: No scleral icterus.       Right eye: No discharge.        Left eye: No discharge.     Conjunctiva/sclera: Conjunctivae normal.  Cardiovascular:     Rate and Rhythm: Normal rate and regular rhythm.     Heart sounds: Normal heart sounds.  Pulmonary:     Effort: Pulmonary effort is normal. No respiratory distress.     Breath sounds: Normal breath sounds.  Musculoskeletal:        General: Normal range of motion.  Skin:    General: Skin is warm and dry.  Neurological:     General: No focal deficit present.     Mental Status: She is alert and oriented to person, place, and time. Mental status is at baseline.  Psychiatric:        Mood and Affect: Mood normal.        Behavior: Behavior normal.        Thought Content: Thought content normal.        Judgment: Judgment normal.     Lab Results  Component Value Date   HGBA1C 6.3 07/23/2024   HGBA1C 5.8 02/15/2024   HGBA1C 6.2 08/13/2023    Lab Results  Component Value Date   CREATININE 0.86 07/23/2024   CREATININE 0.96 05/29/2024   CREATININE 0.89 02/15/2024    Lab Results  Component Value Date   WBC 4.9 12/08/2022   HGB 14.6 12/08/2022   HCT 43.3 12/08/2022   PLT 237.0 12/08/2022   GLUCOSE 105 (H) 07/23/2024   CHOL 163 02/15/2024   TRIG 139.0 02/15/2024   HDL 54.90 02/15/2024   LDLDIRECT 52.0 02/15/2024   LDLCALC 80 02/15/2024   ALT 32 07/23/2024   AST 23 07/23/2024   NA 141 07/23/2024   K 4.2 07/23/2024   CL 102 07/23/2024   CREATININE 0.86 07/23/2024   BUN 12  07/23/2024   CO2 30 07/23/2024   TSH 1.29 12/08/2022   HGBA1C 6.3 07/23/2024   MICROALBUR 0.9 07/23/2024    CT CORONARY MORPH W/CTA COR W/SCORE W/CA W/CM &/OR WO/CM Addendum Date: 06/09/2024 ADDENDUM REPORT: 06/09/2024 06:53 ADDENDUM: Limited non-coronary overread as follows: Cardiovascular: No significant extracardiac vascular findings. Normal heart size. No pericardial effusion. Limited Mediastinum/Nodes: No enlarged mediastinal, hilar, or axillary lymph nodes. Trachea  and esophagus demonstrate no significant findings. Limited Lungs/Pleura: Lungs are clear. No pleural effusion or pneumothorax. Upper Abdomen: No acute abnormality. Musculoskeletal: No chest wall abnormality. No acute osseous findings. IMPRESSION: No acute extracardiac findings in the included chest. Electronically Signed   By: Marolyn JONETTA Jaksch M.D.   On: 06/09/2024 06:53   Result Date: 06/09/2024 CLINICAL DATA:  Dyspnea on exertion EXAM: Cardiac/Coronary  CTA TECHNIQUE: The patient was scanned on a Siemens Somatom scanner. : A prospective scan was triggered in the ascending thoracic aorta. Axial non-contrast 3 mm slices were carried out through the heart. The data set was analyzed on a dedicated work station and scored using the Agatson method. Gantry rotation speed was 66 msecs and collimation was .6 mm. 100mg  of metoprolol  and 0.8 mg of sl NTG was given. The 3D data set was reconstructed in 5% intervals of the 60-95 % of the R-R cycle. Diastolic phases were analyzed on a dedicated work station using MPR, MIP and VRT modes. The patient received 100 cc of contrast. FINDINGS: Aorta:  Normal size.  No calcifications.  No dissection. Aortic Valve:  Trileaflet.  No calcifications. Coronary Arteries:  Normal coronary origin.  Right dominance. RCA is a dominant artery. There is no plaque. Left main gives rise to LAD and LCX arteries. LM has no disease. LAD has no plaque. LCX is a non-dominant artery.  There is no plaque. Other findings: Normal  pulmonary vein drainage into the left atrium. Normal left atrial appendage without a thrombus. Normal size of the pulmonary artery. IMPRESSION: 1. Coronary calcium score of 0. 2. Normal coronary origin with right dominance. 3. No evidence of CAD. 4. CAD-RADS 0. Consider non-atherosclerotic causes of dyspnea or chest pain. Electronically Signed: By: Redell Cave M.D. On: 06/05/2024 12:55    Assessment & Plan:  .Essential hypertension Assessment & Plan: Well controlled on current regimen. Renal function stable, no changes today.   Lab Results  Component Value Date   CREATININE 0.86 07/23/2024   Lab Results  Component Value Date   NA 141 07/23/2024   K 4.2 07/23/2024   CL 102 07/23/2024   CO2 30 07/23/2024     Orders: -     Comprehensive metabolic panel with GFR -     Microalbumin / creatinine urine ratio  Prediabetes -     Hemoglobin A1c  Chronic right hip pain -     Ambulatory referral to Orthopedic Surgery  GAD (generalized anxiety disorder) Assessment & Plan: Controlled with Pristiq  and  wellbutrin  for improvement in libido   Insomnia secondary to anxiety Assessment & Plan: She continues to use alprazolam  due to need for short acting medication The risks and benefits of  Chronic  benzodiazepine use were discussed with patient today including increased risk of dementia,  Addiction, and seizures if abruptly withdrawn  .  Patient was encouraged to reduce use of clonazepam gradually,  Starting with reduction of one dose to 1/2 tablet and use buspirone if needed  .     Morbid obesity (HCC) Assessment & Plan: Complicated by hepatic steatosis OSA , prediabetes and hypertension.  Patient lost 20 lbs frmom Nov 2024 to May 025 using Ozempic  samplease but has has been unable to lower her BMI further without medication and has regained 3 lbs.   Encouraged to increase exercise involvement to include more intense aerobic activity for 30 minutes 5 days per week and make a proper  diet a priority over indulging husband's unhealthy dietary preferences. She now has prediabetes  based on today's labs  Lab Results  Component Value Date   HGBA1C 6.3 07/23/2024      Right hip pain Assessment & Plan: Chronic, Secondary to OA.  Taking NSAIDS.  GI prophylaxis recommended with  PPI . Referring to [facility] for evaluation    Other orders -     buPROPion  HCl ER (XL); Take 1 tablet (150 mg total) by mouth daily.  Dispense: 90 tablet; Refill: 1     Follow-up: Return in about 6 months (around 01/20/2025) for physical.   Verneita LITTIE Kettering, MD

## 2024-07-26 ENCOUNTER — Ambulatory Visit: Payer: Self-pay | Admitting: Internal Medicine

## 2024-07-26 ENCOUNTER — Encounter: Payer: Self-pay | Admitting: Internal Medicine

## 2024-07-26 NOTE — Assessment & Plan Note (Addendum)
 Complicated by hepatic steatosis OSA , prediabetes and hypertension.  Patient lost 20 lbs frmom Nov 2024 to May 025 using Ozempic  samplease but has has been unable to lower her BMI further without medication and has regained 3 lbs.   Encouraged to increase exercise involvement to include more intense aerobic activity for 30 minutes 5 days per week and make a proper diet a priority over indulging husband's unhealthy dietary preferences. She now has prediabetes based on today's labs  Lab Results  Component Value Date   HGBA1C 6.3 07/23/2024

## 2024-07-26 NOTE — Assessment & Plan Note (Signed)
 Controlled with Pristiq  and  wellbutrin  for improvement in libido

## 2024-07-26 NOTE — Assessment & Plan Note (Signed)
 Chronic, Secondary to OA.  Taking NSAIDS.  GI prophylaxis recommended with  PPI . Referring to [facility] for evaluation

## 2024-07-26 NOTE — Assessment & Plan Note (Signed)
 She continues to use alprazolam  due to need for short acting medication The risks and benefits of  Chronic  benzodiazepine use were discussed with patient today including increased risk of dementia,  Addiction, and seizures if abruptly withdrawn  .  Patient was encouraged to reduce use of clonazepam gradually,  Starting with reduction of one dose to 1/2 tablet and use buspirone if needed  .

## 2024-07-26 NOTE — Assessment & Plan Note (Signed)
 Well controlled on current regimen. Renal function stable, no changes today.   Lab Results  Component Value Date   CREATININE 0.86 07/23/2024   Lab Results  Component Value Date   NA 141 07/23/2024   K 4.2 07/23/2024   CL 102 07/23/2024   CO2 30 07/23/2024

## 2024-08-25 ENCOUNTER — Ambulatory Visit: Admitting: Physical Therapy

## 2024-08-25 NOTE — Therapy (Deleted)
 OUTPATIENT PHYSICAL THERAPY THORACOLUMBAR EVALUATION   Patient Name: Laura [name] MRN: [id number] DOB:26-Nov-1970, 53 y.o., female Today's Date: 08/25/2024  END OF SESSION:   Past Medical History:  Diagnosis Date   Allergy    Anxiety 2017   Arthritis 2017   DOE (dyspnea on exertion)    a. 05/2024 Echo: EF 60-65%, no rwma, nl RV fxn, no significant valvular dzs; b. 05/2024 Cor CTA: Nl cors. Ca2+ = 0. No significant non-cardiac findings.   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   Viral respiratory illness 12/25/2023   Past Surgical History:  Procedure Laterality Date   BREAST BIOPSY Left 10/2018   removal of abscess on the skin-   COLONOSCOPY WITH PROPOFOL  N/A 02/18/2021   Procedure: COLONOSCOPY WITH PROPOFOL ;  Surgeon: Dessa Reyes ORN, MD;  Location: [facility];  Service: Endoscopy;  Laterality: N/A;   DILATION AND CURETTAGE OF UTERUS  2014 FEB and August   INCISION AND DRAINAGE ABSCESS Left 10/31/2018   Procedure: INCISION AND DRAINAGE LEFT BREAST ABSCESS;  Surgeon: Desiderio Schanz, MD;  Location: [facility];  Service: General;  Laterality: Left;   IRRIGATION AND DEBRIDEMENT ABSCESS Left 11/05/2018   Procedure: IRRIGATION AND DEBRIDEMENT ABSCESS - BREAST;  Surgeon: Desiderio Schanz, MD;  Location: [facility];  Service: General;  Laterality: Left;   MASTOPEXY Bilateral 09/10/2019   Procedure: MASTOPEXY;  Surgeon: Lowery Estefana RAMAN, DO;  Location: [facility];  Service: Plastics;  Laterality: Bilateral;   MOUTH SURGERY     REDUCTION MAMMAPLASTY Bilateral 08/2019   reducation with lift pt had infection afterwards   SCAR REVISION Left 09/10/2019   Procedure: left breast scar contracture excision;  Surgeon: Lowery Estefana RAMAN, DO;   Location: [facility];  Service: Plastics;  Laterality: Left;  2.5 hours for case, please   TONSILECTOMY/ADENOIDECTOMY WITH MYRINGOTOMY Bilateral 2011   Patient Active Problem List   Diagnosis Date Noted   OSA (obstructive sleep apnea) 09/17/2023   Numbness and tingling in both hands 11/30/2022   Perimenopause 11/30/2022   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   Right hip pain 03/06/2022   Encounter for preventive health examination 12/08/2019   Postoperative breast asymmetry 07/11/2019   Educated about COVID-19 virus infection 03/23/2019   Hospital discharge follow-up 11/19/2018   H/O oral aphthous ulcers 06/11/2018   Hepatic steatosis 01/21/2018   GAD (generalized anxiety disorder) 12/29/2017   Fatigue 11/25/2016   IUD contraception 11/25/2016   Low grade squamous intraepithelial lesion (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 smear 11/25/2016   B12 deficiency 11/23/2016   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   Essential hypertension 01/03/2015   Morbid obesity (HCC) 02/21/2014    PCP: Marylynn Verneita CROME, MD  REFERRING PROVIDER: Marylynn Verneita CROME, MD  REFERRING DIAG: ***  RATIONALE FOR EVALUATION AND TREATMENT: {HABREHAB:27488}  THERAPY DIAG: No diagnosis found.  ONSET DATE: ***  FOLLOW-UP APPT SCHEDULED WITH REFERRING PROVIDER: {yes/no:20286}    SUBJECTIVE:  SUBJECTIVE STATEMENT:  ***  PERTINENT HISTORY: ***  PAIN:    Pain Intensity: Present: /10, Best: /10, Worst: /10 Pain location: *** Pain Quality: {PAIN DESCRIPTION:21022940}  Radiating: {yes/no:20286}  Numbness/Tingling: {yes/no:20286} Focal Weakness: {yes/no:20286} Aggravating factors: *** Relieving factors: *** 24-hour pain behavior: *** How long can you sit: How long can you stand: History of  prior back injury, pain, surgery, or therapy: {yes/no:20286} Dominant hand: {RIGHT/LEFT:20294} Imaging: {yes/no:20286}  Red flags: Negative for bowel/bladder changes, saddle paresthesia, personal history of cancer, h/o spinal tumors, h/o compression fx, h/o abdominal aneurysm, abdominal pain, chills/fever, night sweats, nausea, vomiting, unrelenting pain, first onset of insidious LBP <20 y/o  PRECAUTIONS: {Therapy precautions:24002}  WEIGHT BEARING RESTRICTIONS: {Yes ***/No:24003}  FALLS: Has patient fallen in last 6 months? {fallsyesno:27318}  Living Environment Lives with: {[facility] lives with:25569::lives with their family} Lives in: {Lives in:25570} Stairs: {opstairs:27293} Has following equipment at home: {Assistive devices:23999}  Prior level of function: {PLOF:24004}  Occupational demands:   Hobbies:   Patient Goals: ***   OBJECTIVE:  Patient Surveys  {rehab surveys:24030}  Cognition Patient is oriented to person, place, and time.  Recent memory is intact.  Remote memory is intact.  Attention span and concentration are intact.  Expressive speech is intact.  Patient's fund of knowledge is within normal limits for educational level.    Gross Musculoskeletal Assessment Tremor: None Bulk: Normal Tone: Normal No visible step-off along spinal column, no signs of scoliosis  GAIT: Distance walked: *** Assistive device utilized: {Assistive devices:23999} Level of assistance: {Levels of assistance:24026} Comments: ***  Posture: Lumbar lordosis: WNL Iliac crest height: Equal bilaterally Lumbar lateral shift: Negative  AROM AROM (Normal range in degrees) AROM   Lumbar   Flexion (65)   Extension (30)   Right lateral flexion (25)   Left lateral flexion (25)   Right rotation (30)   Left rotation (30)       Hip Right Left  Flexion (125)    Extension (15)    Abduction (40)    Adduction     Internal Rotation (45)    External Rotation (45)        Knee     Flexion (135)    Extension (0)        Ankle    Dorsiflexion (20)    Plantarflexion (50)    Inversion (35)    Eversion (15)    (* = pain; Blank rows = not tested)  LE MMT: MMT (out of 5) Right  Left   Hip flexion    Hip extension    Hip abduction    Hip adduction    Hip internal rotation    Hip external rotation    Knee flexion    Knee extension    Ankle dorsiflexion    Ankle plantarflexion    Ankle inversion    Ankle eversion    (* = pain; Blank rows = not tested)  Sensation Grossly intact to light touch throughout bilateral LEs as determined by testing dermatomes L2-S2. Proprioception, stereognosis, and hot/cold testing deferred on this date.  Reflexes R/L Knee Jerk (L3/4): 2+/2+  Ankle Jerk (S1/2): 2+/2+   Muscle Length Hamstrings: R: {NEGATIVE/POSITIVE QNM:80001} L: {NEGATIVE/POSITIVE QNM:80001} Ely (quadriceps): R: {NEGATIVE/POSITIVE QNM:80001} L: {NEGATIVE/POSITIVE QNM:80001} Thomas (hip flexors): R: {NEGATIVE/POSITIVE QNM:80001} L: {NEGATIVE/POSITIVE QNM:80001} Ober: R: {NEGATIVE/POSITIVE QNM:80001} L: {NEGATIVE/POSITIVE QNM:80001}  Palpation Location Right Left         Lumbar paraspinals    Quadratus Lumborum    Gluteus Maximus    Gluteus  Medius    Deep hip external rotators    PSIS    Fortin's Area (SIJ)    Greater Trochanter    (Blank rows = not tested) Graded on 0-4 scale (0 = no pain, 1 = pain, 2 = pain with wincing/grimacing/flinching, 3 = pain with withdrawal, 4 = unwilling to allow palpation)  Passive Accessory  Motion Pt denies reproduction of back pain with CPA L1-L5 and UPA bilaterally L1-L5. Generally, hypomobile throughout  Special Tests Lumbar Radiculopathy and Discogenic: Centralization and Peripheralization (SN 92, -LR 0.12): {NEGATIVE/POSITIVE FOR:19998} Slump (SN 83, -LR 0.32): R: {NEGATIVE/POSITIVE FOR:19998} L: {NEGATIVE/POSITIVE FOR:19998} SLR (SN 92, -LR 0.29): R: {NEGATIVE/POSITIVE QNM:80001} L:  {NEGATIVE/POSITIVE  QNM:80001} Crossed SLR (SP 90): R: {NEGATIVE/POSITIVE QNM:80001} L: {NEGATIVE/POSITIVE QNM:80001}  Facet Joint: Extension-Rotation (SN 100, -LR 0.0): R: {NEGATIVE/POSITIVE QNM:80001} L: {NEGATIVE/POSITIVE QNM:80001}  Lumbar Foraminal Stenosis: Lumbar quadrant (SN 70): R: {NEGATIVE/POSITIVE QNM:80001} L: {NEGATIVE/POSITIVE QNM:80001}  Hip: FABER (SN 81): R: {NEGATIVE/POSITIVE QNM:80001} L: {NEGATIVE/POSITIVE QNM:80001} FADIR (SN 94): R: {NEGATIVE/POSITIVE FOR:19998} L: {NEGATIVE/POSITIVE QNM:80001} Hip scour (SN 50): R: {NEGATIVE/POSITIVE QNM:80001} L: {NEGATIVE/POSITIVE QNM:80001}  SIJ:  Thigh Thrust (SN 88, -LR 0.18) : R: {NEGATIVE/POSITIVE QNM:80001} L: {NEGATIVE/POSITIVE QNM:80001}  Piriformis Syndrome: FAIR Test (SN 88, SP 83): R: {NEGATIVE/POSITIVE QNM:80001} L: {NEGATIVE/POSITIVE QNM:80001}  Functional Tasks Lifting: Deep squat: Sit to stand: Forward Step-Down Test: R:  L:  Lateral Step-Down Test: R:  L:   Beighton scale  LEFT  RIGHT           1. Passive dorsiflexion and hyperextension of the fifth MCP joint beyond 90  0 0   2. Passive apposition of the thumb to the flexor aspect of the forearm  0  0   3. Passive hyperextension of the elbow beyond 10  0  0   4. Passive hyperextension of the knee beyond 10  0  0   5. Active forward flexion of the trunk with the knees fully extended so that the palms of the hands rest flat on the floor   0   TOTAL         0/ 9      TODAY'S TREATMENT: DATE: ***     PATIENT EDUCATION:  Education details: *** Person educated: {Person educated:25204} Education method: {Education Method:25205} Education comprehension: {Education Comprehension:25206}   HOME EXERCISE PROGRAM:     ASSESSMENT:  CLINICAL IMPRESSION: Patient is a *** y.o. *** who was seen today for physical therapy evaluation and treatment for ***.   OBJECTIVE IMPAIRMENTS: {opptimpairments:25111}.   ACTIVITY LIMITATIONS:  {activitylimitations:27494}  PARTICIPATION LIMITATIONS: {participationrestrictions:25113}  PERSONAL FACTORS: {Personal factors:25162} are also affecting patient's functional outcome.   REHAB POTENTIAL: {rehabpotential:25112}  CLINICAL DECISION MAKING: {clinical decision making:25114}  EVALUATION COMPLEXITY: {Evaluation complexity:25115}   GOALS: Goals reviewed with patient? Yes  SHORT TERM GOALS: Target date: 09/15/2024  Pt will be independent with HEP in order to improve strength and decrease back pain to improve pain-free function at home and work. Baseline: 08/25/24: *** Goal status: INITIAL   LONG TERM GOALS: Target date: 10/09/2024  Patient will have full thoracolumbar AROM without reproduction of pain as needed for reaching items on ground, household chores, bending. Baseline: 08/25/24: *** Goal status: INITIAL  2.  Pt will decrease worst back pain by at least 2 points on the NPRS in order to demonstrate clinically significant reduction in back pain. Baseline: 08/25/24: *** Goal status: INITIAL  3.  Pt will decrease mODI score by at least 13 points in order demonstrate clinically significant reduction in  back pain/disability.       Baseline: 08/25/24: *** Goal status: INITIAL  4.  *** Baseline: 08/25/24: *** Goal status: INITIAL   PLAN: PT FREQUENCY: 1-2x/week  PT DURATION: 6 weeks  PLANNED INTERVENTIONS: Therapeutic exercises, Therapeutic activity, Neuromuscular re-education, Balance training, Gait training, Patient/Family education, Self Care, Joint mobilization, Joint manipulation, Vestibular training, Canalith repositioning, Orthotic/Fit training, DME instructions, Dry Needling, Electrical stimulation, Spinal manipulation, Spinal mobilization, Cryotherapy, Moist heat, Taping, Traction, Ultrasound, Ionotophoresis 4mg /ml Dexamethasone , Manual therapy, and Re-evaluation.  PLAN FOR NEXT SESSION: ***    Venetia Endo, PT, DPT #[id number]  Venetia ONEIDA Endo,  PT 08/25/2024, 12:28 PM

## 2024-08-27 ENCOUNTER — Ambulatory Visit: Attending: Orthopedic Surgery | Admitting: Physical Therapy

## 2024-08-27 DIAGNOSIS — M5459 Other low back pain: Secondary | ICD-10-CM | POA: Insufficient documentation

## 2024-08-27 DIAGNOSIS — M25551 Pain in right hip: Secondary | ICD-10-CM | POA: Insufficient documentation

## 2024-08-27 DIAGNOSIS — M6281 Muscle weakness (generalized): Secondary | ICD-10-CM | POA: Diagnosis present

## 2024-08-27 DIAGNOSIS — M5416 Radiculopathy, lumbar region: Secondary | ICD-10-CM

## 2024-08-27 DIAGNOSIS — M25552 Pain in left hip: Secondary | ICD-10-CM | POA: Insufficient documentation
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
## 2024-08-28 ENCOUNTER — Encounter: Payer: Self-pay | Admitting: Physical Therapy

## 2024-08-29 ENCOUNTER — Encounter: Payer: Self-pay | Admitting: Internal Medicine

## 2024-09-01 ENCOUNTER — Ambulatory Visit: Admitting: Physical Therapy

## 2024-09-01 DIAGNOSIS — M25551 Pain in right hip: Secondary | ICD-10-CM

## 2024-09-01 DIAGNOSIS — M5459 Other low back pain: Secondary | ICD-10-CM

## 2024-09-01 DIAGNOSIS — M6281 Muscle weakness (generalized): Secondary | ICD-10-CM

## 2024-09-01 DIAGNOSIS — M25552 Pain in left hip: Secondary | ICD-10-CM
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
## 2024-09-03 ENCOUNTER — Ambulatory Visit: Admitting: Physical Therapy

## 2024-09-03 ENCOUNTER — Encounter: Payer: Self-pay | Admitting: Physical Therapy

## 2024-09-03 DIAGNOSIS — M6281 Muscle weakness (generalized): Secondary | ICD-10-CM

## 2024-09-03 DIAGNOSIS — M5459 Other low back pain: Secondary | ICD-10-CM

## 2024-09-03 DIAGNOSIS — M25551 Pain in right hip: Secondary | ICD-10-CM

## 2024-09-03 DIAGNOSIS — M25552 Pain in left hip: Secondary | ICD-10-CM
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
## 2024-09-08 ENCOUNTER — Ambulatory Visit: Admitting: Physical Therapy

## 2024-09-10 ENCOUNTER — Ambulatory Visit: Admitting: Physical Therapy

## 2024-09-10 DIAGNOSIS — M25551 Pain in right hip: Secondary | ICD-10-CM

## 2024-09-10 DIAGNOSIS — M25552 Pain in left hip: Secondary | ICD-10-CM

## 2024-09-10 DIAGNOSIS — M6281 Muscle weakness (generalized): Secondary | ICD-10-CM

## 2024-09-10 DIAGNOSIS — M5459 Other low back pain: Secondary | ICD-10-CM
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
## 2024-09-11 ENCOUNTER — Encounter: Payer: Self-pay | Admitting: Physical Therapy

## 2024-09-15 ENCOUNTER — Ambulatory Visit: Admitting: Physical Therapy

## 2024-09-15 ENCOUNTER — Encounter: Payer: Self-pay | Admitting: Physical Therapy

## 2024-09-15 DIAGNOSIS — M25551 Pain in right hip: Secondary | ICD-10-CM

## 2024-09-15 DIAGNOSIS — M25552 Pain in left hip: Secondary | ICD-10-CM

## 2024-09-15 DIAGNOSIS — M6281 Muscle weakness (generalized): Secondary | ICD-10-CM

## 2024-09-15 DIAGNOSIS — M5459 Other low back pain: Secondary | ICD-10-CM
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
## 2024-09-17 ENCOUNTER — Other Ambulatory Visit: Payer: Self-pay | Admitting: Internal Medicine

## 2024-09-17 ENCOUNTER — Ambulatory Visit: Admitting: Physical Therapy

## 2024-09-17 DIAGNOSIS — Z1231 Encounter for screening mammogram for malignant neoplasm of breast: Secondary | ICD-10-CM

## 2024-09-18 ENCOUNTER — Other Ambulatory Visit: Payer: Self-pay | Admitting: Medical Genetics

## 2024-09-18 DIAGNOSIS — Z006 Encounter for examination for normal comparison and control in clinical research program: Secondary | ICD-10-CM

## 2024-09-22 ENCOUNTER — Ambulatory Visit
Admission: RE | Admit: 2024-09-22 | Discharge: 2024-09-22 | Disposition: A | Discharge status: Home / Self Care | Source: Ambulatory Visit | Attending: Internal Medicine | Admitting: Internal Medicine

## 2024-09-22 ENCOUNTER — Ambulatory Visit: Attending: Orthopedic Surgery | Admitting: Physical Therapy

## 2024-09-22 ENCOUNTER — Encounter: Payer: Self-pay | Admitting: Physical Therapy

## 2024-09-22 DIAGNOSIS — M25551 Pain in right hip: Secondary | ICD-10-CM | POA: Diagnosis present

## 2024-09-22 DIAGNOSIS — M5459 Other low back pain: Secondary | ICD-10-CM | POA: Insufficient documentation

## 2024-09-22 DIAGNOSIS — M6281 Muscle weakness (generalized): Secondary | ICD-10-CM | POA: Insufficient documentation

## 2024-09-22 DIAGNOSIS — Z1231 Encounter for screening mammogram for malignant neoplasm of breast: Secondary | ICD-10-CM | POA: Diagnosis present

## 2024-09-22 DIAGNOSIS — M25552 Pain in left hip: Secondary | ICD-10-CM | POA: Insufficient documentation
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
## 2024-09-24 ENCOUNTER — Ambulatory Visit: Admitting: Physical Therapy

## 2024-09-24 DIAGNOSIS — M5459 Other low back pain: Secondary | ICD-10-CM

## 2024-09-24 DIAGNOSIS — M6281 Muscle weakness (generalized): Secondary | ICD-10-CM

## 2024-09-24 DIAGNOSIS — M25551 Pain in right hip: Secondary | ICD-10-CM | POA: Diagnosis not present

## 2024-09-24 DIAGNOSIS — M25552 Pain in left hip: Secondary | ICD-10-CM
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
## 2024-09-25 ENCOUNTER — Encounter: Payer: Self-pay | Admitting: Physical Therapy

## 2024-10-13 LAB — GENECONNECT MOLECULAR SCREEN: Genetic Analysis Overall Interpretation: NEGATIVE

## 2024-11-05 ENCOUNTER — Encounter (INDEPENDENT_AMBULATORY_CARE_PROVIDER_SITE_OTHER): Payer: Self-pay

## 2024-11-22 ENCOUNTER — Other Ambulatory Visit: Payer: Self-pay | Admitting: Internal Medicine

## 2025-01-21 ENCOUNTER — Encounter: Admitting: Internal Medicine
